# Patient Record
Sex: Female | Born: 1960
Health system: Southern US, Community
[De-identification: ages and names within clinical notes are randomized; demographics above are authoritative.]

## PROBLEM LIST (undated history)

## (undated) DIAGNOSIS — M797 Fibromyalgia: Secondary | ICD-10-CM

## (undated) DIAGNOSIS — E079 Disorder of thyroid, unspecified: Secondary | ICD-10-CM

## (undated) DIAGNOSIS — E7800 Pure hypercholesterolemia, unspecified: Secondary | ICD-10-CM

## (undated) DIAGNOSIS — J449 Chronic obstructive pulmonary disease, unspecified: Secondary | ICD-10-CM

## (undated) DIAGNOSIS — C801 Malignant (primary) neoplasm, unspecified: Secondary | ICD-10-CM

## (undated) DIAGNOSIS — Z9889 Other specified postprocedural states: Secondary | ICD-10-CM

## (undated) DIAGNOSIS — M1990 Unspecified osteoarthritis, unspecified site: Secondary | ICD-10-CM

## (undated) DIAGNOSIS — M4302 Spondylolysis, cervical region: Secondary | ICD-10-CM

## (undated) DIAGNOSIS — E785 Hyperlipidemia, unspecified: Secondary | ICD-10-CM

## (undated) DIAGNOSIS — K635 Polyp of colon: Secondary | ICD-10-CM

## (undated) DIAGNOSIS — I10 Essential (primary) hypertension: Secondary | ICD-10-CM

## (undated) DIAGNOSIS — M51369 Other intervertebral disc degeneration, lumbar region without mention of lumbar back pain or lower extremity pain: Secondary | ICD-10-CM

## (undated) DIAGNOSIS — K219 Gastro-esophageal reflux disease without esophagitis: Secondary | ICD-10-CM

## (undated) DIAGNOSIS — K5790 Diverticulosis of intestine, part unspecified, without perforation or abscess without bleeding: Secondary | ICD-10-CM

## (undated) DIAGNOSIS — L7211 Pilar cyst: Secondary | ICD-10-CM

## (undated) DIAGNOSIS — E119 Type 2 diabetes mellitus without complications: Secondary | ICD-10-CM

## (undated) DIAGNOSIS — Z720 Tobacco use: Secondary | ICD-10-CM

## (undated) DIAGNOSIS — J45909 Unspecified asthma, uncomplicated: Secondary | ICD-10-CM

## (undated) DIAGNOSIS — M654 Radial styloid tenosynovitis [de Quervain]: Secondary | ICD-10-CM

## (undated) DIAGNOSIS — G542 Cervical root disorders, not elsewhere classified: Secondary | ICD-10-CM

## (undated) DIAGNOSIS — F329 Major depressive disorder, single episode, unspecified: Secondary | ICD-10-CM

## (undated) DIAGNOSIS — R32 Unspecified urinary incontinence: Secondary | ICD-10-CM

## (undated) DIAGNOSIS — L680 Hirsutism: Secondary | ICD-10-CM

## (undated) DIAGNOSIS — K760 Fatty (change of) liver, not elsewhere classified: Secondary | ICD-10-CM

## (undated) DIAGNOSIS — K589 Irritable bowel syndrome without diarrhea: Secondary | ICD-10-CM

## (undated) DIAGNOSIS — T1491XA Suicide attempt, initial encounter: Secondary | ICD-10-CM

## (undated) DIAGNOSIS — R768 Other specified abnormal immunological findings in serum: Secondary | ICD-10-CM

## (undated) DIAGNOSIS — G5601 Carpal tunnel syndrome, right upper limb: Secondary | ICD-10-CM

## (undated) DIAGNOSIS — L689 Hypertrichosis, unspecified: Secondary | ICD-10-CM

## (undated) DIAGNOSIS — G8929 Other chronic pain: Secondary | ICD-10-CM

## (undated) DIAGNOSIS — M9981 Other biomechanical lesions of cervical region: Secondary | ICD-10-CM

## (undated) DIAGNOSIS — F419 Anxiety disorder, unspecified: Secondary | ICD-10-CM

## (undated) DIAGNOSIS — E669 Obesity, unspecified: Secondary | ICD-10-CM

## (undated) DIAGNOSIS — M65339 Trigger finger, unspecified middle finger: Secondary | ICD-10-CM

## (undated) DIAGNOSIS — M5136 Other intervertebral disc degeneration, lumbar region: Secondary | ICD-10-CM

## (undated) DIAGNOSIS — G43909 Migraine, unspecified, not intractable, without status migrainosus: Secondary | ICD-10-CM

## (undated) DIAGNOSIS — M549 Dorsalgia, unspecified: Secondary | ICD-10-CM

## (undated) DIAGNOSIS — R609 Edema, unspecified: Secondary | ICD-10-CM

## (undated) DIAGNOSIS — G4700 Insomnia, unspecified: Secondary | ICD-10-CM

## (undated) DIAGNOSIS — K5900 Constipation, unspecified: Secondary | ICD-10-CM

## (undated) DIAGNOSIS — J4489 Other specified chronic obstructive pulmonary disease: Secondary | ICD-10-CM

## (undated) DIAGNOSIS — M4802 Spinal stenosis, cervical region: Secondary | ICD-10-CM

## (undated) DIAGNOSIS — K649 Unspecified hemorrhoids: Secondary | ICD-10-CM

## (undated) DIAGNOSIS — M353 Polymyalgia rheumatica: Secondary | ICD-10-CM

## (undated) HISTORY — DX: Polymyalgia rheumatica: M35.3

## (undated) HISTORY — DX: Fibromyalgia: M79.7

## (undated) HISTORY — DX: Migraine, unspecified, not intractable, without status migrainosus: G43.909

## (undated) HISTORY — DX: Fatty (change of) liver, not elsewhere classified: K76.0

## (undated) HISTORY — DX: Pilar cyst: L72.11

## (undated) HISTORY — DX: Hirsutism: L68.0

## (undated) HISTORY — DX: Disorder of thyroid, unspecified: E07.9

## (undated) HISTORY — DX: Major depressive disorder, single episode, unspecified: F32.9

## (undated) HISTORY — DX: Pure hypercholesterolemia, unspecified: E78.00

## (undated) HISTORY — DX: Edema, unspecified: R60.9

## (undated) HISTORY — DX: Insomnia, unspecified: G47.00

## (undated) HISTORY — DX: Hyperlipidemia, unspecified: E78.5

## (undated) HISTORY — DX: Unspecified urinary incontinence: R32

## (undated) HISTORY — DX: Constipation, unspecified: K59.00

## (undated) HISTORY — DX: Obesity, unspecified: E66.9

## (undated) HISTORY — DX: Other specified postprocedural states: Z98.890

## (undated) HISTORY — DX: Spinal stenosis, cervical region: M48.02

## (undated) HISTORY — DX: Chronic obstructive pulmonary disease, unspecified: J44.9

## (undated) HISTORY — DX: Carpal tunnel syndrome, right upper limb: G56.01

## (undated) HISTORY — DX: Cervical root disorders, not elsewhere classified: G54.2

## (undated) HISTORY — DX: Other specified chronic obstructive pulmonary disease: J44.89

## (undated) HISTORY — DX: Other specified abnormal immunological findings in serum: R76.8

## (undated) HISTORY — DX: Suicide attempt, initial encounter: T14.91XA

## (undated) HISTORY — DX: Unspecified osteoarthritis, unspecified site: M19.90

## (undated) HISTORY — DX: Tobacco use: Z72.0

## (undated) HISTORY — DX: Dorsalgia, unspecified: M54.9

## (undated) HISTORY — DX: Trigger finger, unspecified middle finger: M65.339

## (undated) HISTORY — DX: Unspecified hemorrhoids: K64.9

## (undated) HISTORY — DX: Irritable bowel syndrome without diarrhea: K58.9

## (undated) HISTORY — DX: Spondylolysis, cervical region: M43.02

## (undated) HISTORY — DX: Anxiety disorder, unspecified: F41.9

## (undated) HISTORY — DX: Other intervertebral disc degeneration, lumbar region: M51.36

## (undated) HISTORY — DX: Unspecified asthma, uncomplicated: J45.909

## (undated) HISTORY — DX: Other biomechanical lesions of cervical region: M99.81

## (undated) HISTORY — DX: Gastro-esophageal reflux disease without esophagitis: K21.9

## (undated) HISTORY — DX: Polyp of colon: K63.5

## (undated) HISTORY — DX: Hypertrichosis, unspecified: L68.9

## (undated) HISTORY — DX: Other intervertebral disc degeneration, lumbar region without mention of lumbar back pain or lower extremity pain: M51.369

## (undated) HISTORY — DX: Diverticulosis of intestine, part unspecified, without perforation or abscess without bleeding: K57.90

## (undated) HISTORY — DX: Radial styloid tenosynovitis (de quervain): M65.4
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
## 2003-07-03 DIAGNOSIS — Z720 Tobacco use: Secondary | ICD-10-CM

## 2003-07-03 DIAGNOSIS — K219 Gastro-esophageal reflux disease without esophagitis: Secondary | ICD-10-CM

## 2003-07-03 HISTORY — DX: Gastro-esophageal reflux disease without esophagitis: K21.9

## 2003-07-03 HISTORY — DX: Tobacco use: Z72.0

## 2003-07-05 DIAGNOSIS — E7800 Pure hypercholesterolemia, unspecified: Secondary | ICD-10-CM

## 2003-07-05 HISTORY — DX: Pure hypercholesterolemia, unspecified: E78.00

## 2003-10-18 ENCOUNTER — Ambulatory Visit (HOSPITAL_COMMUNITY): Admission: RE | Admit: 2003-10-18 | Discharge: 2003-10-19 | Payer: Self-pay | Admitting: Neurosurgery

## 2005-09-20 HISTORY — PX: CERVICAL FUSION: SHX112

## 2005-09-20 HISTORY — PX: BACK SURGERY: SHX140

## 2005-10-25 ENCOUNTER — Ambulatory Visit: Payer: Self-pay | Admitting: Cardiology

## 2007-02-26 ENCOUNTER — Ambulatory Visit: Payer: Self-pay | Admitting: Cardiology

## 2008-05-30 DIAGNOSIS — M65339 Trigger finger, unspecified middle finger: Secondary | ICD-10-CM

## 2008-05-30 HISTORY — DX: Trigger finger, unspecified middle finger: M65.339

## 2008-09-20 DIAGNOSIS — K589 Irritable bowel syndrome without diarrhea: Secondary | ICD-10-CM

## 2008-09-20 DIAGNOSIS — G4700 Insomnia, unspecified: Secondary | ICD-10-CM

## 2008-09-20 HISTORY — DX: Irritable bowel syndrome, unspecified: K58.9

## 2008-09-20 HISTORY — DX: Insomnia, unspecified: G47.00

## 2008-10-11 DIAGNOSIS — G5601 Carpal tunnel syndrome, right upper limb: Secondary | ICD-10-CM

## 2008-10-11 HISTORY — DX: Carpal tunnel syndrome, right upper limb: G56.01

## 2009-03-31 DIAGNOSIS — M353 Polymyalgia rheumatica: Secondary | ICD-10-CM

## 2009-03-31 HISTORY — DX: Polymyalgia rheumatica: M35.3

## 2009-06-30 DIAGNOSIS — M797 Fibromyalgia: Secondary | ICD-10-CM

## 2009-06-30 HISTORY — DX: Fibromyalgia: M79.7

## 2011-06-25 DIAGNOSIS — J449 Chronic obstructive pulmonary disease, unspecified: Secondary | ICD-10-CM

## 2011-06-25 DIAGNOSIS — T1491XA Suicide attempt, initial encounter: Secondary | ICD-10-CM

## 2011-06-25 DIAGNOSIS — F32A Depression, unspecified: Secondary | ICD-10-CM

## 2011-06-25 DIAGNOSIS — M1990 Unspecified osteoarthritis, unspecified site: Secondary | ICD-10-CM

## 2011-06-25 HISTORY — DX: Suicide attempt, initial encounter: T14.91XA

## 2011-06-25 HISTORY — DX: Depression, unspecified: F32.A

## 2011-06-25 HISTORY — DX: Unspecified osteoarthritis, unspecified site: M19.90

## 2011-06-25 HISTORY — DX: Chronic obstructive pulmonary disease, unspecified: J44.9

## 2011-07-30 DIAGNOSIS — L7211 Pilar cyst: Secondary | ICD-10-CM

## 2011-07-30 HISTORY — DX: Pilar cyst: L72.11

## 2011-09-16 DIAGNOSIS — R079 Chest pain, unspecified: Secondary | ICD-10-CM

## 2011-10-13 DIAGNOSIS — G43909 Migraine, unspecified, not intractable, without status migrainosus: Secondary | ICD-10-CM

## 2011-10-13 HISTORY — DX: Migraine, unspecified, not intractable, without status migrainosus: G43.909

## 2012-01-05 HISTORY — PX: WRIST ARTHROSCOPY: SHX838

## 2012-01-05 HISTORY — PX: CARPECTOMY WITH RADIAL STYLOIDECTOMY: SHX5609

## 2012-08-22 DIAGNOSIS — R768 Other specified abnormal immunological findings in serum: Secondary | ICD-10-CM

## 2012-08-22 HISTORY — DX: Other specified abnormal immunological findings in serum: R76.8

## 2012-08-25 DIAGNOSIS — K635 Polyp of colon: Secondary | ICD-10-CM

## 2012-08-25 DIAGNOSIS — K5790 Diverticulosis of intestine, part unspecified, without perforation or abscess without bleeding: Secondary | ICD-10-CM

## 2012-08-25 HISTORY — DX: Polyp of colon: K63.5

## 2012-08-25 HISTORY — DX: Diverticulosis of intestine, part unspecified, without perforation or abscess without bleeding: K57.90

## 2012-10-03 DIAGNOSIS — G542 Cervical root disorders, not elsewhere classified: Secondary | ICD-10-CM

## 2012-10-03 DIAGNOSIS — M4302 Spondylolysis, cervical region: Secondary | ICD-10-CM

## 2012-10-03 HISTORY — DX: Cervical root disorders, not elsewhere classified: G54.2

## 2012-10-03 HISTORY — DX: Spondylolysis, cervical region: M43.02

## 2013-04-04 HISTORY — PX: ABDOMINAL HYSTERECTOMY: SHX81

## 2013-07-02 DIAGNOSIS — M654 Radial styloid tenosynovitis [de Quervain]: Secondary | ICD-10-CM

## 2013-07-02 HISTORY — DX: Radial styloid tenosynovitis (de quervain): M65.4

## 2013-08-23 DIAGNOSIS — E669 Obesity, unspecified: Secondary | ICD-10-CM

## 2013-08-23 HISTORY — DX: Obesity, unspecified: E66.9

## 2014-07-01 DIAGNOSIS — K649 Unspecified hemorrhoids: Secondary | ICD-10-CM

## 2014-07-01 HISTORY — DX: Unspecified hemorrhoids: K64.9

## 2015-06-10 DIAGNOSIS — K760 Fatty (change of) liver, not elsewhere classified: Secondary | ICD-10-CM

## 2015-06-10 HISTORY — DX: Fatty (change of) liver, not elsewhere classified: K76.0

## 2015-10-01 LAB — HEMOGLOBIN A1C: Hemoglobin A1C: 5.8

## 2015-10-01 LAB — BASIC METABOLIC PANEL
BUN: 11 mg/dL (ref 4–21)
Creatinine: 0.8 mg/dL (ref 0.5–1.1)
Glucose: 80 mg/dL
Potassium: 4.2 mmol/L (ref 3.4–5.3)
SODIUM: 141 mmol/L (ref 137–147)

## 2015-10-01 LAB — LIPID PANEL
Cholesterol: 243 mg/dL — AB (ref 0–200)
HDL: 40 mg/dL (ref 35–70)
LDL Cholesterol: 164 mg/dL
LDl/HDL Ratio: 4.1
TRIGLYCERIDES: 192 mg/dL — AB (ref 40–160)

## 2015-10-01 LAB — HEPATIC FUNCTION PANEL
ALT: 16 U/L (ref 7–35)
AST: 17 U/L (ref 13–35)
Alkaline Phosphatase: 139 U/L — AB (ref 25–125)

## 2015-10-01 LAB — TSH: TSH: 1.06 u[IU]/mL (ref 0.41–5.90)

## 2015-10-27 ENCOUNTER — Encounter: Payer: Self-pay | Admitting: Internal Medicine

## 2015-10-27 ENCOUNTER — Ambulatory Visit: Payer: Medicare Other | Admitting: Internal Medicine

## 2017-10-31 DIAGNOSIS — E785 Hyperlipidemia, unspecified: Secondary | ICD-10-CM | POA: Diagnosis not present

## 2017-10-31 DIAGNOSIS — R7301 Impaired fasting glucose: Secondary | ICD-10-CM | POA: Diagnosis not present

## 2017-10-31 DIAGNOSIS — M5010 Cervical disc disorder with radiculopathy, unspecified cervical region: Secondary | ICD-10-CM | POA: Diagnosis not present

## 2017-10-31 DIAGNOSIS — I10 Essential (primary) hypertension: Secondary | ICD-10-CM | POA: Diagnosis not present

## 2017-11-02 ENCOUNTER — Encounter: Payer: Self-pay | Admitting: Internal Medicine

## 2017-11-04 ENCOUNTER — Other Ambulatory Visit (HOSPITAL_COMMUNITY): Payer: Self-pay | Admitting: Internal Medicine

## 2017-11-04 DIAGNOSIS — Z780 Asymptomatic menopausal state: Secondary | ICD-10-CM

## 2017-11-04 DIAGNOSIS — Z1231 Encounter for screening mammogram for malignant neoplasm of breast: Secondary | ICD-10-CM

## 2017-11-11 ENCOUNTER — Ambulatory Visit (HOSPITAL_COMMUNITY)
Admission: RE | Admit: 2017-11-11 | Discharge: 2017-11-11 | Disposition: A | Discharge status: Home / Self Care | Payer: Medicare Other | Source: Ambulatory Visit | Attending: Internal Medicine | Admitting: Internal Medicine

## 2017-11-11 ENCOUNTER — Encounter (HOSPITAL_COMMUNITY): Payer: Self-pay

## 2017-11-11 DIAGNOSIS — Z780 Asymptomatic menopausal state: Secondary | ICD-10-CM | POA: Insufficient documentation

## 2017-11-11 DIAGNOSIS — Z1231 Encounter for screening mammogram for malignant neoplasm of breast: Secondary | ICD-10-CM | POA: Insufficient documentation

## 2017-11-11 DIAGNOSIS — M85852 Other specified disorders of bone density and structure, left thigh: Secondary | ICD-10-CM | POA: Diagnosis not present

## 2017-11-15 DIAGNOSIS — E782 Mixed hyperlipidemia: Secondary | ICD-10-CM | POA: Diagnosis not present

## 2017-11-15 DIAGNOSIS — I10 Essential (primary) hypertension: Secondary | ICD-10-CM | POA: Diagnosis not present

## 2017-11-15 DIAGNOSIS — R7301 Impaired fasting glucose: Secondary | ICD-10-CM | POA: Diagnosis not present

## 2017-11-15 DIAGNOSIS — Z0000 Encounter for general adult medical examination without abnormal findings: Secondary | ICD-10-CM | POA: Diagnosis not present

## 2017-12-13 DIAGNOSIS — F5101 Primary insomnia: Secondary | ICD-10-CM | POA: Diagnosis not present

## 2017-12-13 DIAGNOSIS — M5136 Other intervertebral disc degeneration, lumbar region: Secondary | ICD-10-CM | POA: Diagnosis not present

## 2017-12-21 ENCOUNTER — Ambulatory Visit: Payer: Medicare Other | Admitting: Gastroenterology

## 2018-01-11 DIAGNOSIS — M542 Cervicalgia: Secondary | ICD-10-CM | POA: Diagnosis not present

## 2018-01-11 DIAGNOSIS — I10 Essential (primary) hypertension: Secondary | ICD-10-CM | POA: Diagnosis not present

## 2018-01-11 DIAGNOSIS — G8929 Other chronic pain: Secondary | ICD-10-CM | POA: Diagnosis not present

## 2018-01-11 DIAGNOSIS — M1990 Unspecified osteoarthritis, unspecified site: Secondary | ICD-10-CM | POA: Diagnosis not present

## 2018-01-11 DIAGNOSIS — M797 Fibromyalgia: Secondary | ICD-10-CM | POA: Diagnosis not present

## 2018-01-11 DIAGNOSIS — J449 Chronic obstructive pulmonary disease, unspecified: Secondary | ICD-10-CM | POA: Diagnosis not present

## 2018-01-11 DIAGNOSIS — M545 Low back pain: Secondary | ICD-10-CM | POA: Diagnosis not present

## 2018-01-11 DIAGNOSIS — Z79899 Other long term (current) drug therapy: Secondary | ICD-10-CM | POA: Diagnosis not present

## 2018-01-11 DIAGNOSIS — M25512 Pain in left shoulder: Secondary | ICD-10-CM | POA: Diagnosis not present

## 2018-01-11 DIAGNOSIS — E7800 Pure hypercholesterolemia, unspecified: Secondary | ICD-10-CM | POA: Diagnosis not present

## 2018-01-11 DIAGNOSIS — K219 Gastro-esophageal reflux disease without esophagitis: Secondary | ICD-10-CM | POA: Diagnosis not present

## 2018-02-27 ENCOUNTER — Other Ambulatory Visit: Payer: Self-pay

## 2018-02-27 NOTE — Patient Outreach (Signed)
[facility]) Care Management  02/27/2018  GRACELYNN BIRCHER Jan 09, 1961 [id number]   Medication Adherence call to Mrs. Kahlea Cobert left a message for patient to call back patient is due on Lovastatin 20 mg. Under [facility] Ins.   [place] Management Direct Dial [phone]  Fax [phone] [email or web address]

## 2018-03-09 ENCOUNTER — Ambulatory Visit: Payer: Medicare Other | Admitting: Gastroenterology

## 2018-03-14 DIAGNOSIS — R42 Dizziness and giddiness: Secondary | ICD-10-CM | POA: Diagnosis not present

## 2018-03-14 DIAGNOSIS — I10 Essential (primary) hypertension: Secondary | ICD-10-CM | POA: Diagnosis not present

## 2018-03-14 DIAGNOSIS — N3281 Overactive bladder: Secondary | ICD-10-CM | POA: Diagnosis not present

## 2018-03-14 DIAGNOSIS — R002 Palpitations: Secondary | ICD-10-CM | POA: Diagnosis not present

## 2018-03-14 DIAGNOSIS — R51 Headache: Secondary | ICD-10-CM | POA: Diagnosis not present

## 2018-06-05 ENCOUNTER — Encounter: Payer: Self-pay | Admitting: Gastroenterology

## 2018-06-05 ENCOUNTER — Telehealth: Payer: Self-pay | Admitting: Gastroenterology

## 2018-06-05 ENCOUNTER — Ambulatory Visit: Payer: Medicare Other | Admitting: Gastroenterology

## 2018-06-05 NOTE — Telephone Encounter (Signed)
PATIENT WAS A NO SHOW AND LETTER SENT  °

## 2018-08-25 DIAGNOSIS — Z79891 Long term (current) use of opiate analgesic: Secondary | ICD-10-CM | POA: Diagnosis not present

## 2018-08-25 DIAGNOSIS — G894 Chronic pain syndrome: Secondary | ICD-10-CM | POA: Diagnosis not present

## 2018-08-25 DIAGNOSIS — I10 Essential (primary) hypertension: Secondary | ICD-10-CM | POA: Diagnosis not present

## 2018-09-27 DIAGNOSIS — Z79891 Long term (current) use of opiate analgesic: Secondary | ICD-10-CM | POA: Diagnosis not present

## 2018-09-27 DIAGNOSIS — E559 Vitamin D deficiency, unspecified: Secondary | ICD-10-CM | POA: Diagnosis not present

## 2018-09-27 DIAGNOSIS — I10 Essential (primary) hypertension: Secondary | ICD-10-CM | POA: Diagnosis not present

## 2018-09-27 DIAGNOSIS — G894 Chronic pain syndrome: Secondary | ICD-10-CM | POA: Diagnosis not present

## 2018-09-27 DIAGNOSIS — M8580 Other specified disorders of bone density and structure, unspecified site: Secondary | ICD-10-CM | POA: Diagnosis not present

## 2018-09-27 DIAGNOSIS — Z1329 Encounter for screening for other suspected endocrine disorder: Secondary | ICD-10-CM | POA: Diagnosis not present

## 2018-09-27 DIAGNOSIS — Z131 Encounter for screening for diabetes mellitus: Secondary | ICD-10-CM | POA: Diagnosis not present

## 2018-09-29 DIAGNOSIS — E782 Mixed hyperlipidemia: Secondary | ICD-10-CM | POA: Diagnosis not present

## 2018-09-29 DIAGNOSIS — E559 Vitamin D deficiency, unspecified: Secondary | ICD-10-CM | POA: Diagnosis not present

## 2018-09-29 DIAGNOSIS — I10 Essential (primary) hypertension: Secondary | ICD-10-CM | POA: Diagnosis not present

## 2018-09-29 DIAGNOSIS — T8189XD Other complications of procedures, not elsewhere classified, subsequent encounter: Secondary | ICD-10-CM | POA: Diagnosis not present

## 2018-10-17 IMAGING — MG DIGITAL SCREENING BILATERAL MAMMOGRAM WITH CAD
5 series · 5 of 5 positions shown · non-contrast
Comparison: None.

ACR Breast Density Category a: The breast tissue is almost entirely
fatty.

CLINICAL DATA: Screening.

EXAM:
DIGITAL SCREENING BILATERAL MAMMOGRAM WITH CAD

[L CC]
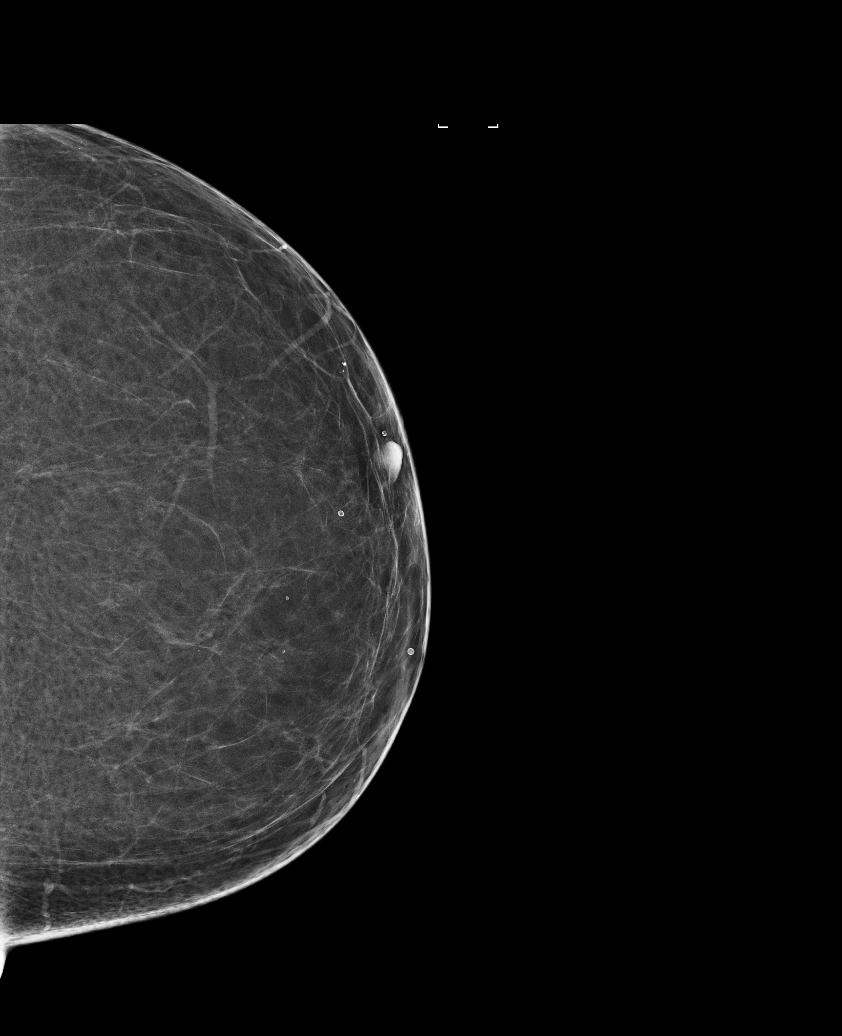

[R MLO (1 of 2)]
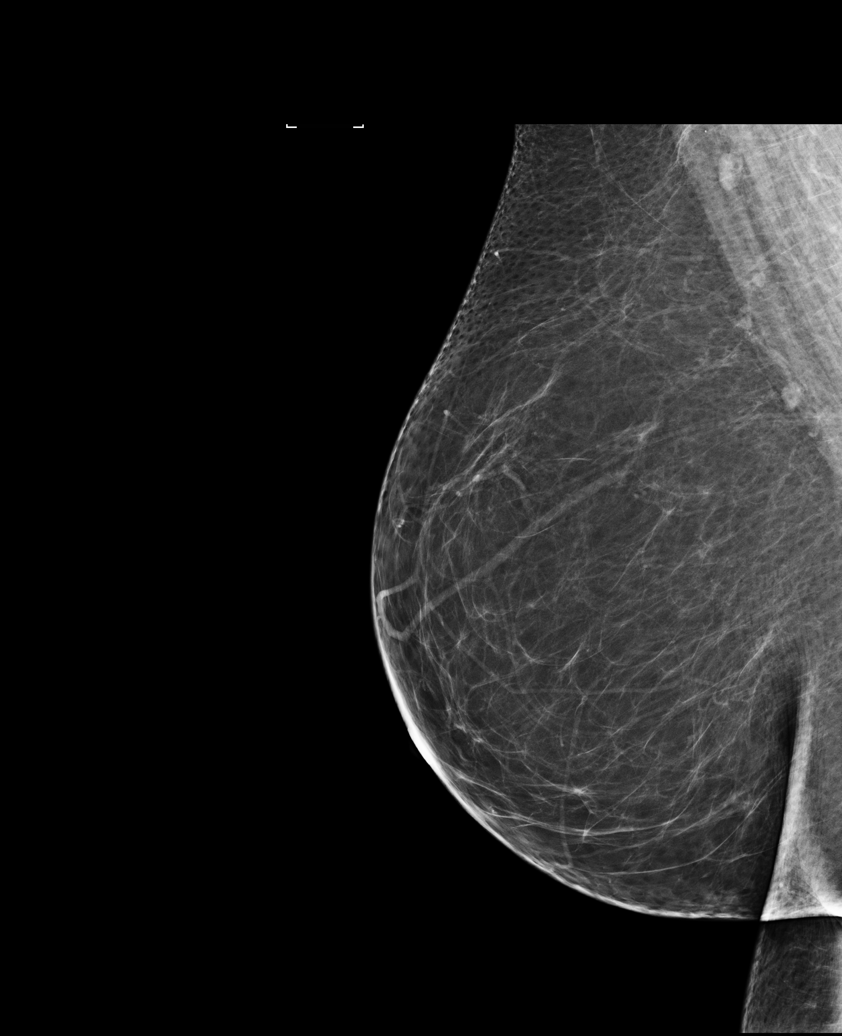

[L MLO]
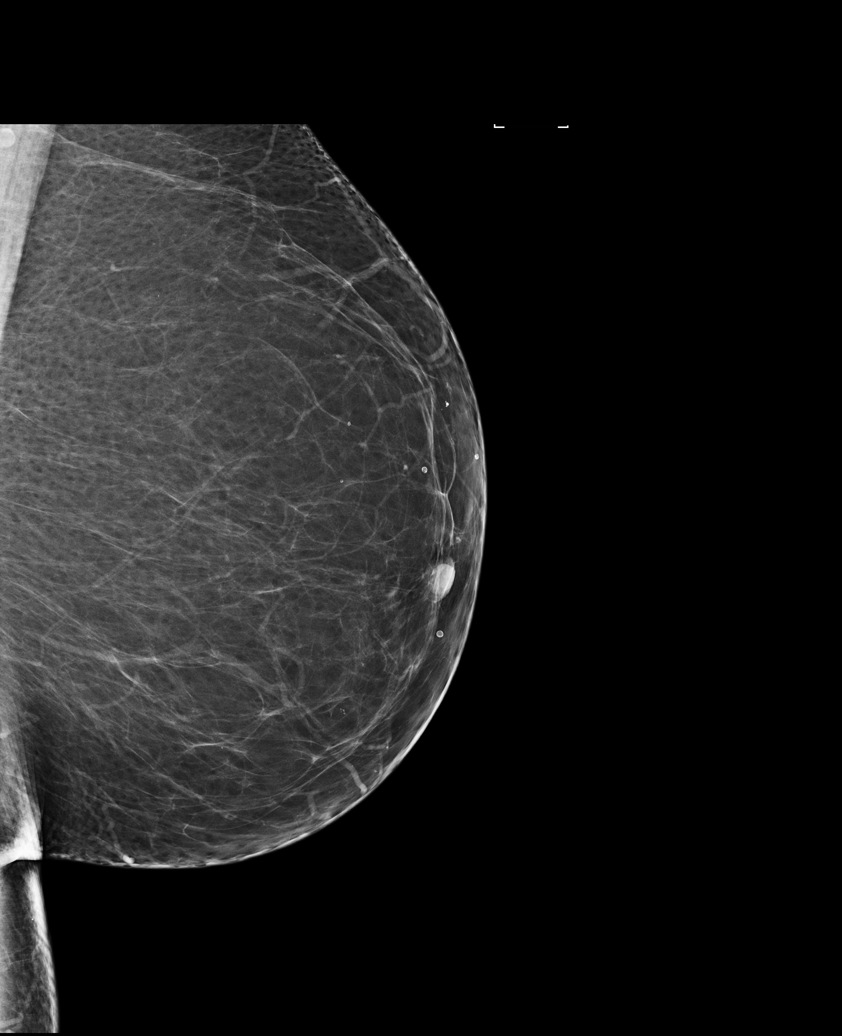

[R MLO (2 of 2)]
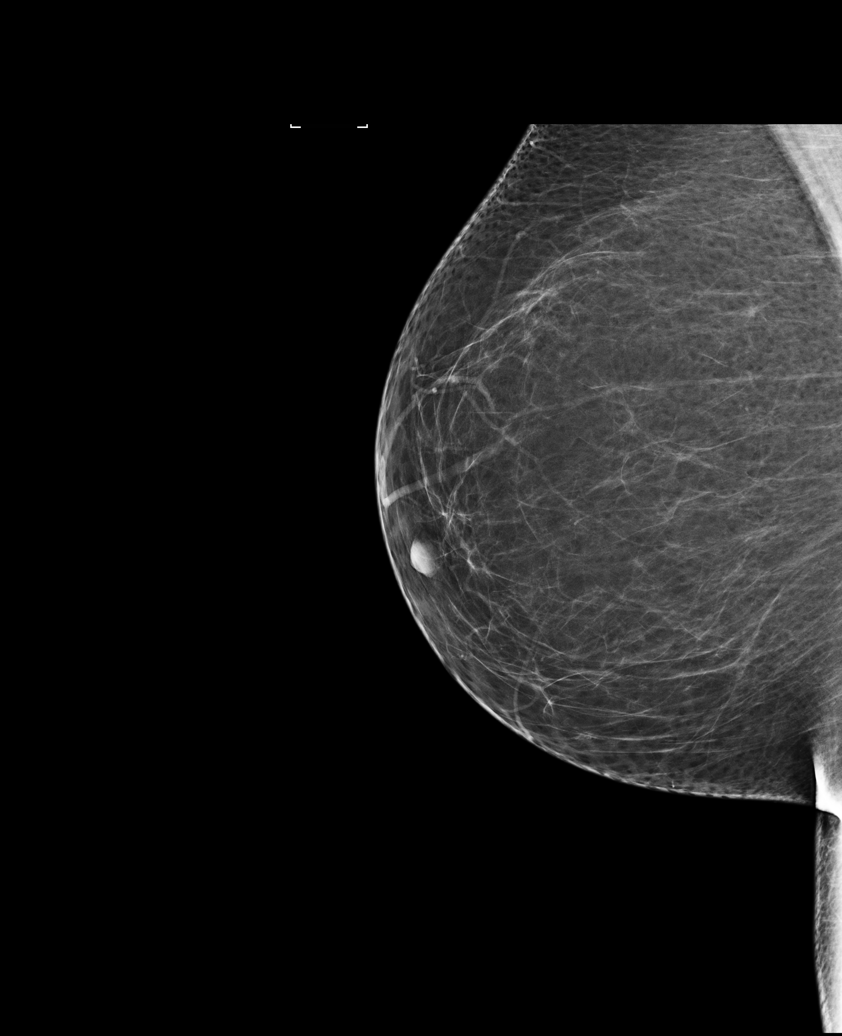

[R CC]
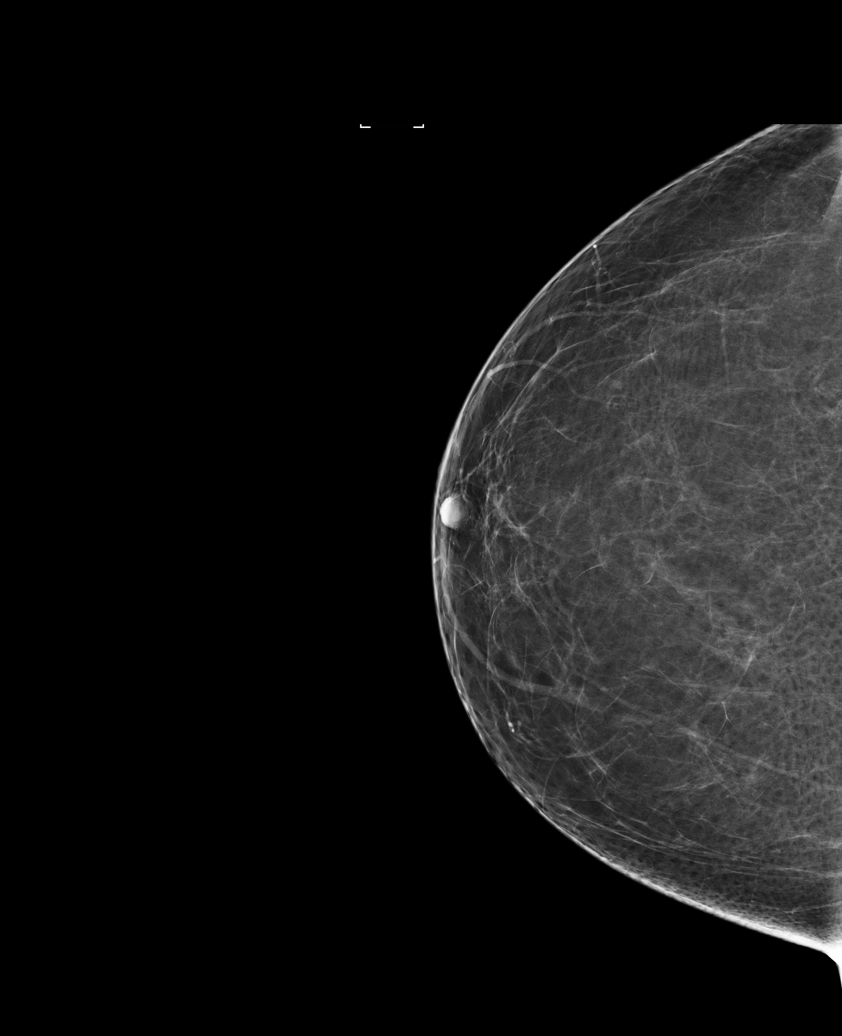

[5 of 5 positions shown; findings below may reference images not displayed]

FINDINGS: There are no findings suspicious for malignancy. Images were
processed with CAD.
IMPRESSION: No mammographic evidence of malignancy. A result letter of this
screening mammogram will be mailed directly to the patient.

RECOMMENDATION:
Screening mammogram in one year. (Code:[id number])

BI-RADS CATEGORY  1: Negative.

## 2018-12-20 DIAGNOSIS — E782 Mixed hyperlipidemia: Secondary | ICD-10-CM | POA: Diagnosis not present

## 2018-12-20 DIAGNOSIS — I10 Essential (primary) hypertension: Secondary | ICD-10-CM | POA: Diagnosis not present

## 2018-12-20 DIAGNOSIS — R7301 Impaired fasting glucose: Secondary | ICD-10-CM | POA: Diagnosis not present

## 2018-12-21 DIAGNOSIS — L2089 Other atopic dermatitis: Secondary | ICD-10-CM | POA: Diagnosis not present

## 2019-01-05 DIAGNOSIS — R109 Unspecified abdominal pain: Secondary | ICD-10-CM | POA: Diagnosis not present

## 2019-01-05 DIAGNOSIS — R300 Dysuria: Secondary | ICD-10-CM | POA: Diagnosis not present

## 2019-01-05 DIAGNOSIS — R3915 Urgency of urination: Secondary | ICD-10-CM | POA: Diagnosis not present

## 2019-01-15 DIAGNOSIS — I10 Essential (primary) hypertension: Secondary | ICD-10-CM | POA: Diagnosis not present

## 2019-01-17 ENCOUNTER — Other Ambulatory Visit: Payer: Self-pay | Admitting: Internal Medicine

## 2019-01-17 DIAGNOSIS — E785 Hyperlipidemia, unspecified: Secondary | ICD-10-CM | POA: Diagnosis not present

## 2019-01-17 DIAGNOSIS — I10 Essential (primary) hypertension: Secondary | ICD-10-CM | POA: Diagnosis not present

## 2019-01-17 DIAGNOSIS — Z780 Asymptomatic menopausal state: Secondary | ICD-10-CM

## 2019-01-17 DIAGNOSIS — Z0000 Encounter for general adult medical examination without abnormal findings: Secondary | ICD-10-CM | POA: Diagnosis not present

## 2019-01-17 DIAGNOSIS — R600 Localized edema: Secondary | ICD-10-CM | POA: Diagnosis not present

## 2019-01-17 DIAGNOSIS — E2839 Other primary ovarian failure: Secondary | ICD-10-CM

## 2019-01-17 DIAGNOSIS — N3281 Overactive bladder: Secondary | ICD-10-CM | POA: Diagnosis not present

## 2019-01-18 ENCOUNTER — Encounter (INDEPENDENT_AMBULATORY_CARE_PROVIDER_SITE_OTHER): Payer: Self-pay | Admitting: *Deleted

## 2019-02-01 DIAGNOSIS — I10 Essential (primary) hypertension: Secondary | ICD-10-CM | POA: Diagnosis not present

## 2019-02-01 DIAGNOSIS — R7301 Impaired fasting glucose: Secondary | ICD-10-CM | POA: Diagnosis not present

## 2019-02-01 DIAGNOSIS — E782 Mixed hyperlipidemia: Secondary | ICD-10-CM | POA: Diagnosis not present

## 2019-03-23 DIAGNOSIS — E782 Mixed hyperlipidemia: Secondary | ICD-10-CM | POA: Diagnosis not present

## 2019-03-23 DIAGNOSIS — I10 Essential (primary) hypertension: Secondary | ICD-10-CM | POA: Diagnosis not present

## 2019-03-23 DIAGNOSIS — R7301 Impaired fasting glucose: Secondary | ICD-10-CM | POA: Diagnosis not present

## 2019-04-02 DIAGNOSIS — R1030 Lower abdominal pain, unspecified: Secondary | ICD-10-CM | POA: Diagnosis not present

## 2019-04-02 DIAGNOSIS — L989 Disorder of the skin and subcutaneous tissue, unspecified: Secondary | ICD-10-CM | POA: Diagnosis not present

## 2019-04-02 DIAGNOSIS — L680 Hirsutism: Secondary | ICD-10-CM | POA: Diagnosis not present

## 2019-04-10 ENCOUNTER — Telehealth: Payer: Self-pay | Admitting: Internal Medicine

## 2019-04-10 NOTE — Telephone Encounter (Signed)
Pt is aware of OV tomorrow °

## 2019-04-10 NOTE — Telephone Encounter (Signed)
[phone]  PATIENT CALLED TO MAKE AN APPOINTMENT, PLEASE CALL BACK

## 2019-04-11 ENCOUNTER — Ambulatory Visit (INDEPENDENT_AMBULATORY_CARE_PROVIDER_SITE_OTHER): Payer: Medicare Other | Admitting: Nurse Practitioner

## 2019-04-11 ENCOUNTER — Other Ambulatory Visit: Payer: Self-pay

## 2019-04-11 ENCOUNTER — Encounter: Payer: Self-pay | Admitting: Nurse Practitioner

## 2019-04-11 DIAGNOSIS — R195 Other fecal abnormalities: Secondary | ICD-10-CM | POA: Diagnosis not present

## 2019-04-11 DIAGNOSIS — K219 Gastro-esophageal reflux disease without esophagitis: Secondary | ICD-10-CM

## 2019-04-11 DIAGNOSIS — L858 Other specified epidermal thickening: Secondary | ICD-10-CM | POA: Diagnosis not present

## 2019-04-11 DIAGNOSIS — C44629 Squamous cell carcinoma of skin of left upper limb, including shoulder: Secondary | ICD-10-CM | POA: Diagnosis not present

## 2019-04-11 HISTORY — DX: Other fecal abnormalities: R19.5

## 2019-04-11 MED ORDER — CLENPIQ 10-3.5-12 MG-GM -GM/160ML PO SOLN: 1.0000 | Freq: Once | ORAL | 0 refills | Status: AC

## 2019-04-11 MED ORDER — OMEPRAZOLE 20 MG PO CPDR: 20.0000 mg | DELAYED_RELEASE_CAPSULE | Freq: Two times a day (BID) | ORAL | 3 refills | Status: DC

## 2019-04-11 NOTE — Assessment & Plan Note (Signed)
Patient historically has GERD which is been well managed on omeprazole 20 mg daily.  As of the past 2 to 3 months her symptoms have gotten progressively worse.  Symptoms tend to be worsening later in the evening.  She may not be getting adequate round-the-clock coverage with her current dosage.  I will increase her omeprazole 20 mg twice a day.  Follow-up in 3 months to evaluate her progress with increased dosage.

## 2019-04-11 NOTE — Assessment & Plan Note (Signed)
Referred by primary care for complaints of red stools which were found to be heme positive on Hemoccult card.  She is previously had a colonoscopy in [facility] and we will request this report for her records.  She states this is about 9 years ago when she was 58 years old and she would normally be coming due for a repeat 10-year colonoscopy.  Given her heme positive stools we will proceed with colonoscopy at this time.  Further recommendations to follow.  Proceed with TCS on propofol/MAC with Dr. Gala Romney in near future: the risks, benefits, and alternatives have been discussed with the patient in detail. The patient states understanding and desires to proceed.  The patient is currently on Lexapro, hydrocodone.  No other anticoagulants, anxiolytics, chronic pain medications, or antidepressants.  The patient is not on any diabetes or iron medications.  Denies alcohol and drug use.  We will plan for the procedure on propofol/MAC to promote adequate sedation.

## 2019-04-11 NOTE — Patient Instructions (Addendum)
Your health issues we discussed today were:   Blood in your stools: 1. We will check a colonoscopy to try to find the source of the bleeding 2. Further recommendations will follow your procedure 3. Call us if you have any worsening/severe bleeding, dizziness, lightheadedness, weakness, fatigue, or other symptoms that you are losing a lot of blood.  GERD (reflux/heartburn): 1. Due to your worsening reflux/heartburn symptoms I will increase your omeprazole to 20 mg twice a day. 2. Take this 30 minutes before your first meal the day and 30 minutes for your last meal the day 3. Avoid trigger foods 4. Avoid eating within 3 to 4 hours of lying down for sleep 5. Further diet recommendations below.  Overall I recommend:  1. Continue your other current medications 2. Return for follow-up in 3 months 3. Call us if you have any questions or concerns.   Because of recent events of COVID-19 ("Coronavirus"), follow CDC recommendations:  1. Wash your hand frequently 2. Avoid touching your face 3. Stay away from people who are sick 4. If you have symptoms such as fever, cough, shortness of breath then call your healthcare provider for further guidance 5. If you are sick, STAY AT HOME unless otherwise directed by your healthcare provider. 6. Follow directions from state and national officials regarding staying safe   At [facility] we value your feedback. You may receive a survey about your visit today. Please share your experience as we strive to create trusting relationships with our patients to provide genuine, compassionate, quality care.  We appreciate your understanding and patience as we review any laboratory studies, imaging, and other diagnostic tests that are ordered as we care for you. Our office policy is 5 business days for review of these results, and any emergent or urgent results are addressed in a timely manner for your best interest. If you do not hear from our office  in 1 week, please contact us.   We also encourage the use of MyChart, which contains your medical information for your review as well. If you are not enrolled in this feature, an access code is on this after visit summary for your convenience. Thank you for allowing [place] to be involved in your care.  It was great to see you today!  I hope you have a great summer!!       Food Choices for Gastroesophageal Reflux Disease, Adult When you have gastroesophageal reflux disease (GERD), the foods you eat and your eating habits are very important. Choosing the right foods can help ease the discomfort of GERD. Consider working with a diet and nutrition specialist (dietitian) to help you make healthy food choices. What general guidelines should I follow?  Eating plan  Choose healthy foods low in fat, such as fruits, vegetables, whole grains, low-fat dairy products, and lean meat, fish, and poultry.  Eat frequent, small meals instead of three large meals each day. Eat your meals slowly, in a relaxed setting. Avoid bending over or lying down until 2-3 hours after eating.  Limit high-fat foods such as fatty meats or fried foods.  Limit your intake of oils, butter, and shortening to less than 8 teaspoons each day.  Avoid the following: ? Foods that cause symptoms. These may be different for different people. Keep a food diary to keep track of foods that cause symptoms. ? Alcohol. ? Drinking large amounts of liquid with meals. ? Eating meals during the 2-3 hours before bed.  Cook foods using methods other  than frying. This may include baking, grilling, or broiling. Lifestyle  Maintain a healthy weight. Ask your health care provider what weight is healthy for you. If you need to lose weight, work with your health care provider to do so safely.  Exercise for at least 30 minutes on 5 or more days each week, or as told by your health care provider.  Avoid wearing clothes that fit tightly around your  waist and chest.  Do not use any products that contain nicotine or tobacco, such as cigarettes and e-cigarettes. If you need help quitting, ask your health care provider.  Sleep with the head of your bed raised. Use a wedge under the mattress or blocks under the bed frame to raise the head of the bed. What foods are not recommended? The items listed may not be a complete list. Talk with your dietitian about what dietary choices are best for you. Grains Pastries or quick breads with added fat. [place] toast. Vegetables Deep fried vegetables. [place] fries. Any vegetables prepared with added fat. Any vegetables that cause symptoms. For some people this may include tomatoes and tomato products, chili peppers, onions and garlic, and horseradish. Fruits Any fruits prepared with added fat. Any fruits that cause symptoms. For some people this may include citrus fruits, such as oranges, grapefruit, pineapple, and lemons. Meats and other protein foods High-fat meats, such as fatty beef or pork, hot dogs, ribs, ham, sausage, salami and bacon. Fried meat or protein, including fried fish and fried chicken. Nuts and nut butters. Dairy Whole milk and chocolate milk. Sour cream. Cream. Ice cream. Cream cheese. Milk shakes. Beverages Coffee and tea, with or without caffeine. Carbonated beverages. Sodas. Energy drinks. Fruit juice made with acidic fruits (such as orange or grapefruit). Tomato juice. Alcoholic drinks. Fats and oils Butter. Margarine. Shortening. Ghee. Sweets and desserts Chocolate and cocoa. Donuts. Seasoning and other foods Pepper. Peppermint and spearmint. Any condiments, herbs, or seasonings that cause symptoms. For some people, this may include curry, hot sauce, or vinegar-based salad dressings. Summary  When you have gastroesophageal reflux disease (GERD), food and lifestyle choices are very important to help ease the discomfort of GERD.  Eat frequent, small meals instead of three  large meals each day. Eat your meals slowly, in a relaxed setting. Avoid bending over or lying down until 2-3 hours after eating.  Limit high-fat foods such as fatty meat or fried foods. This information is not intended to replace advice given to you by your health care provider. Make sure you discuss any questions you have with your health care provider. Document Released: 09/06/2005 Document Revised: 12/28/2018 Document Reviewed: 09/07/2016 Elsevier Patient Education  2020 [employer].

## 2019-04-11 NOTE — Progress Notes (Signed)
Primary Care Physician:  Celene Squibb, MD Primary Gastroenterologist:  Dr. Gala Romney  Chief Complaint  Patient presents with  . heme+    last TCS was done in [place]  . Gastroesophageal Reflux    HPI:   Stacey Brooks is a 58 y.o. female who presents on referral from primary care for blood in the stool.  Reviewed information provided with referral including office visit dated 05/12/60.  At that time noted left lower abdominal pain and diarrhea postprandially for 2 months.  At that time she noted some red bowel movements and Hemoccult cards recommended.  She was also empirically treated for diverticulitis with Cipro and Flagyl.  Hemoccult cards were positive for blood and she was subsequently referred to GI.Marland Kitchen  No history of colonoscopy or endoscopy in our system.  Today she states she's doing ok overall. States her last colonoscopy was in [place], [place] at age 49 (about 9 years ago). She noted some blood in her stools and they checked a test and it was positive for blood. She has had a couple instances of fecal urgency without diarrhea. GERD has gotten worse in the past 2-3 months; symptoms include epigastric pain. Denies throat burning/bitter taste. When she was initially diagnosed her symptoms were the same and improved with PPI; now getting worse. Is avoiding spicy foods. Denies dysphagia. Saw hematochezia 2 times, in the commode. Denies known hemorrhoids. Yesterday she did have rectal soreness and itching. Denies other abdominal pain, N/V, melena, fever, chills, unintentional weight loss. Denies URI or flu-like symptoms. Denies loss of sense of taste or smell. Denies chest pain, dyspnea, dizziness, lightheadedness, syncope, near syncope. Denies any other upper or lower GI symptoms.  Past Medical History:  Diagnosis Date  . ANA positive   . Anxiety   . Asthma    Intermittent; asthmatic bronchitis  . Back pain 06/25/2011  . Carpal tunnel syndrome on right 10/11/2008  . Cervical  nerve root impingement 10/03/2012  . Cervical spondylolysis 10/03/2012  . Colon polyps 08/25/2012  . Constipation   . COPD (chronic obstructive pulmonary disease) ([place]) 06/25/2011  . De Quervain's disease (tenosynovitis) 07/02/2013  . Depression 06/25/2011  . Diverticulosis 08/25/2012  . Edema 07/03/2003  . Excessive hair 08/22/2012   body and facial   . Fatty liver 06/10/2015  . Fibromyalgia 06/30/2009  . GERD (gastroesophageal reflux disease) 07/03/2003  . H/O bladder repair surgery 02/23/2013  . H/O neck surgery 10/03/2012  . Hemorrhoids 07/01/2014  . Hirsutism   . Hypercholesterolemia 07/05/2003   Mild  . Hyperlipidemia   . IBS (irritable bowel syndrome) 2010  . Incontinence of urine in female 09/09/2003  . Insomnia 2010  . Lumbar degenerative disc disease   . Migraine 10/13/2011  . Neural foraminal stenosis of cervical spine   . Obesity 08/23/2013  . Obstructive chronic bronchitis ([place])   . Osteoarthritis 06/25/2011  . Pilar cyst 07/30/2011  . Polymyalgia ([place]) 03/31/2009  . SS-A antibody positive 08/22/2012  . Suicide attempt ([place]) 06/25/2011  . Thyroid disease    Hypothyroidism  . Tobacco abuse 07/03/2003  . Trigger middle finger 05/30/2008    Past Surgical History:  Procedure Laterality Date  . ABDOMINAL HYSTERECTOMY  04/04/2013  . BACK SURGERY  2007  . CARPECTOMY WITH RADIAL STYLOIDECTOMY  01/05/2012   DeQuervain's Release   . CERVICAL FUSION  2007  . WRIST ARTHROSCOPY  01/05/2012   release of carpal ligament    Current Outpatient Medications  Medication Sig Dispense Refill  . albuterol (  PROVENTIL HFA;VENTOLIN HFA) 108 (90 Base) MCG/ACT inhaler Inhale 2 puffs into the lungs every 6 (six) hours as needed for wheezing or shortness of breath.    Marland Kitchen amLODipine (NORVASC) 5 MG tablet Take 1 tablet by mouth daily.    Marland Kitchen escitalopram (LEXAPRO) 5 MG tablet Take 5 mg by mouth daily.    . hydrochlorothiazide (HYDRODIURIL) 25 MG tablet Take 25 mg by mouth daily.    Marland Kitchen  HYDROcodone-acetaminophen (NORCO) 7.5-325 MG tablet Take 1 tablet by mouth every 6 (six) hours as needed for moderate pain.    Marland Kitchen lisinopril (ZESTRIL) 40 MG tablet Take 1 tablet by mouth daily.    Marland Kitchen lovastatin (MEVACOR) 20 MG tablet Take 1 tablet by mouth at bedtime.    Marland Kitchen omeprazole (PRILOSEC) 20 MG capsule Take 1 capsule by mouth daily.    Marland Kitchen oxybutynin (DITROPAN-XL) 10 MG 24 hr tablet Take 10 mg by mouth daily.     No current facility-administered medications for this visit.     Allergies as of 04/11/2019 - Review Complete 04/11/2019  Allergen Reaction Noted  . Clindamycin/lincomycin Rash 10/27/2015  . Erythromycin Itching and Rash 10/27/2015    Family History  Problem Relation Age of Onset  . Emphysema Mother   . Hypertension Father   . Cancer Sister        Ovarian  . Cancer Maternal Grandmother   . Cancer Maternal Grandfather   . Colon cancer Neg Hx     Social History   Socioeconomic History  . Marital status: Single    Spouse name: Not on file  . Number of children: Not on file  . Years of education: Not on file  . Highest education level: Not on file  Occupational History  . Not on file  Social Needs  . Financial resource strain: Not on file  . Food insecurity    Worry: Not on file    Inability: Not on file  . Transportation needs    Medical: Not on file    Non-medical: Not on file  Tobacco Use  . Smoking status: Current Every Day Smoker    Packs/day: 0.50    Years: 35.00    Pack years: 17.50  . Smokeless tobacco: Never Used  Substance and Sexual Activity  . Alcohol use: No    Alcohol/week: 0.0 standard drinks  . Drug use: Not Currently    Types: Cocaine  . Sexual activity: Not on file  Lifestyle  . Physical activity    Days per week: Not on file    Minutes per session: Not on file  . Stress: Not on file  Relationships  . Social Herbalist on phone: Not on file    Gets together: Not on file    Attends religious service: Not on file     Active member of club or organization: Not on file    Attends meetings of clubs or organizations: Not on file    Relationship status: Not on file  . Intimate partner violence    Fear of current or ex partner: Not on file    Emotionally abused: Not on file    Physically abused: Not on file    Forced sexual activity: Not on file  Other Topics Concern  . Not on file  Social History Narrative   Widowed   Unemployed    Review of Systems: General: Negative for anorexia, weight loss, fever, chills, fatigue, weakness. ENT: Negative for hoarseness, difficulty swallowing. CV: Negative for chest  pain, angina, palpitations, peripheral edema.  Respiratory: Negative for dyspnea at rest, cough, sputum, wheezing.  GI: See history of present illness. MS: Negative for joint pain, low back pain.  Derm: Negative for rash or itching.  Endo: Negative for unusual weight change.  Heme: Negative for bruising or bleeding. Allergy: Negative for rash or hives.    Physical Exam: BP 94/63   Pulse 83   Temp 98 F (36.7 C) (Oral)   Ht 5\' 3"  (1.6 m)   Wt 192 lb 6.4 oz (87.3 kg)   BMI 34.08 kg/m  General:   Alert and oriented. Pleasant and cooperative. Well-nourished and well-developed.  Eyes:  Without icterus, sclera clear and conjunctiva pink.  Ears:  Normal auditory acuity. Cardiovascular:  S1, S2 present without murmurs appreciated. Extremities without clubbing or edema. Respiratory:  Clear to auscultation bilaterally. No wheezes, rales, or rhonchi. No distress.  Gastrointestinal:  +BS, soft, non-tender and non-distended. No HSM noted. No guarding or rebound. No masses appreciated.  Rectal:  Deferred  Musculoskalatal:  Symmetrical without gross deformities. Neurologic:  Alert and oriented x4;  grossly normal neurologically. Psych:  Alert and cooperative. Normal mood and affect. Heme/Lymph/Immune: No excessive bruising noted.    04/11/2019 2:00 PM   Disclaimer: This note was dictated with  voice recognition software. Similar sounding words can inadvertently be transcribed and may not be corrected upon review.

## 2019-04-12 ENCOUNTER — Telehealth: Payer: Self-pay | Admitting: Internal Medicine

## 2019-04-12 NOTE — Telephone Encounter (Signed)
Pt notified of EG's recommendation and will contact our office if she has any of the symptoms listed below.

## 2019-04-12 NOTE — Telephone Encounter (Signed)
Possibly Pepto related. Monitor for any further black stools. Let us know if she has any worsening fatigue, weakness, dizziness, chest pain, shortness of breath.  If she has a recurrence will likely need a CBC and/or iFOBT

## 2019-04-12 NOTE — Telephone Encounter (Signed)
Pt was seen in office yesterday. This morning she said she had a black stool with sharp pain. Please call [phone]

## 2019-04-12 NOTE — Telephone Encounter (Signed)
Spoke with pt. She noticed that her stool was black this morning. Pt didn't see any active blood in the toilet or with wiping. Stool was formed and not in diarrhea form. Pt didn't notice mucus in the toilet.  Pt took a small amount of Pepo Bismol 4 days ago. Since pt called, her stomach pain that she was having has subsided. Please advise.

## 2019-04-12 NOTE — Progress Notes (Signed)
cc'ed to pcp °

## 2019-04-16 ENCOUNTER — Encounter: Payer: Self-pay | Admitting: *Deleted

## 2019-04-25 DIAGNOSIS — E782 Mixed hyperlipidemia: Secondary | ICD-10-CM | POA: Diagnosis not present

## 2019-04-25 DIAGNOSIS — I10 Essential (primary) hypertension: Secondary | ICD-10-CM | POA: Diagnosis not present

## 2019-04-25 DIAGNOSIS — M5030 Other cervical disc degeneration, unspecified cervical region: Secondary | ICD-10-CM | POA: Diagnosis not present

## 2019-04-25 DIAGNOSIS — R7301 Impaired fasting glucose: Secondary | ICD-10-CM | POA: Diagnosis not present

## 2019-05-08 ENCOUNTER — Ambulatory Visit: Payer: Self-pay | Admitting: "Endocrinology

## 2019-06-11 ENCOUNTER — Telehealth: Payer: Self-pay | Admitting: Internal Medicine

## 2019-06-11 NOTE — Telephone Encounter (Signed)
Called patient. She needs a mondy or Tuesday. Scheduled for 12/7 at 10:45am. Patient aware will mail new pre-op appt with new covid-19 testing appt. Confirmed address. Called endo and made aware.

## 2019-06-11 NOTE — Telephone Encounter (Signed)
Pt is needing to reschedule her procedure with [facility] on 9/24 to either a Monday or Tuesday. [phone]

## 2019-06-12 ENCOUNTER — Other Ambulatory Visit (HOSPITAL_COMMUNITY)
Admission: RE | Admit: 2019-06-12 | Discharge: 2019-06-12 | Disposition: A | Discharge status: Home / Self Care | Payer: Medicare Other | Source: Ambulatory Visit | Attending: Internal Medicine | Admitting: Internal Medicine

## 2019-06-12 ENCOUNTER — Encounter (HOSPITAL_COMMUNITY): Admission: RE | Admit: 2019-06-12 | Payer: Medicare Other | Source: Ambulatory Visit

## 2019-06-14 DIAGNOSIS — I10 Essential (primary) hypertension: Secondary | ICD-10-CM | POA: Diagnosis not present

## 2019-06-14 DIAGNOSIS — E782 Mixed hyperlipidemia: Secondary | ICD-10-CM | POA: Diagnosis not present

## 2019-06-14 DIAGNOSIS — R7301 Impaired fasting glucose: Secondary | ICD-10-CM | POA: Diagnosis not present

## 2019-06-18 ENCOUNTER — Encounter: Payer: Self-pay | Admitting: "Endocrinology

## 2019-06-18 ENCOUNTER — Ambulatory Visit (INDEPENDENT_AMBULATORY_CARE_PROVIDER_SITE_OTHER): Payer: Medicare Other | Admitting: "Endocrinology

## 2019-06-18 ENCOUNTER — Other Ambulatory Visit: Payer: Self-pay

## 2019-06-18 VITALS — BP 99/62 | HR 72 | Ht 63.0 in | Wt 196.0 lb

## 2019-06-18 DIAGNOSIS — L689 Hypertrichosis, unspecified: Secondary | ICD-10-CM | POA: Insufficient documentation

## 2019-06-18 DIAGNOSIS — L680 Hirsutism: Secondary | ICD-10-CM | POA: Diagnosis not present

## 2019-06-18 NOTE — Progress Notes (Signed)
Endocrinology Consult Note                                            06/18/2019, 5:14 PM   Subjective:    Patient ID: Stacey Brooks, female    DOB: February 20, 1961, PCP Stacey Squibb, MD   Past Medical History:  Diagnosis Date  . ANA positive   . Anxiety   . Asthma    Intermittent; asthmatic bronchitis  . Back pain 06/25/2011  . Carpal tunnel syndrome on right 10/11/2008  . Cervical nerve root impingement 10/03/2012  . Cervical spondylolysis 10/03/2012  . Colon polyps 08/25/2012  . Constipation   . COPD (chronic obstructive pulmonary disease) ([place]) 06/25/2011  . De Quervain's disease (tenosynovitis) 07/02/2013  . Depression 06/25/2011  . Diverticulosis 08/25/2012  . Edema 07/03/2003  . Excessive hair 08/22/2012   body and facial   . Fatty liver 06/10/2015  . Fibromyalgia 06/30/2009  . GERD (gastroesophageal reflux disease) 07/03/2003  . H/O bladder repair surgery 02/23/2013  . H/O neck surgery 10/03/2012  . Hemorrhoids 07/01/2014  . Hirsutism   . Hypercholesterolemia 07/05/2003   Mild  . Hyperlipidemia   . IBS (irritable bowel syndrome) 2010  . Incontinence of urine in female 09/09/2003  . Insomnia 2010  . Lumbar degenerative disc disease   . Migraine 10/13/2011  . Neural foraminal stenosis of cervical spine   . Obesity 08/23/2013  . Obstructive chronic bronchitis ([place])   . Osteoarthritis 06/25/2011  . Pilar cyst 07/30/2011  . Polymyalgia ([place]) 03/31/2009  . SS-A antibody positive 08/22/2012  . Suicide attempt ([place]) 06/25/2011  . Thyroid disease    Hypothyroidism  . Tobacco abuse 07/03/2003  . Trigger middle finger 05/30/2008   Past Surgical History:  Procedure Laterality Date  . ABDOMINAL HYSTERECTOMY  04/04/2013  . BACK SURGERY  2007  . CARPECTOMY WITH RADIAL STYLOIDECTOMY  01/05/2012   DeQuervain's Release   . CERVICAL FUSION  2007  . WRIST ARTHROSCOPY  01/05/2012   release of carpal ligament   Social History   Socioeconomic History  . Marital status: Single     Spouse name: Not on file  . Number of children: Not on file  . Years of education: Not on file  . Highest education level: Not on file  Occupational History  . Not on file  Social Needs  . Financial resource strain: Not on file  . Food insecurity    Worry: Not on file    Inability: Not on file  . Transportation needs    Medical: Not on file    Non-medical: Not on file  Tobacco Use  . Smoking status: Current Every Day Smoker    Packs/day: 0.50    Years: 35.00    Pack years: 17.50  . Smokeless tobacco: Never Used  Substance and Sexual Activity  . Alcohol use: No    Alcohol/week: 0.0 standard drinks  . Drug use: Not Currently    Types: Cocaine  . Sexual activity: Not on file  Lifestyle  . Physical activity    Days per week: Not on file    Minutes per session: Not on file  . Stress: Not on file  Relationships  . Social Herbalist on phone: Not on file    Gets together: Not on file    Attends religious service: Not on  file    Active member of club or organization: Not on file    Attends meetings of clubs or organizations: Not on file    Relationship status: Not on file  Other Topics Concern  . Not on file  Social History Narrative   Widowed   Unemployed   Family History  Problem Relation Age of Onset  . Emphysema Mother   . Hypertension Father   . Cancer Sister        Ovarian  . Cancer Maternal Grandmother   . Cancer Maternal Grandfather   . Colon cancer Neg Hx    Outpatient Encounter Medications as of 06/18/2019  Medication Sig  . albuterol (PROVENTIL HFA;VENTOLIN HFA) 108 (90 Base) MCG/ACT inhaler Inhale 2 puffs into the lungs every 6 (six) hours as needed for wheezing or shortness of breath.  Marland Kitchen amLODipine (NORVASC) 5 MG tablet Take 5 mg by mouth daily.   Marland Kitchen escitalopram (LEXAPRO) 5 MG tablet Take 5 mg by mouth daily.  . hydrALAZINE (APRESOLINE) 25 MG tablet Take 25 mg by mouth 2 (two) times daily.  . hydrochlorothiazide (HYDRODIURIL) 25 MG tablet  Take 25 mg by mouth daily.  Marland Kitchen lisinopril (ZESTRIL) 40 MG tablet Take 40 mg by mouth daily.   Marland Kitchen lovastatin (MEVACOR) 20 MG tablet Take 20 mg by mouth at bedtime.   Marland Kitchen omeprazole (PRILOSEC) 20 MG capsule Take 1 capsule (20 mg total) by mouth 2 (two) times daily before a meal. (Patient taking differently: Take 20 mg by mouth daily. )  . oxybutynin (DITROPAN-XL) 10 MG 24 hr tablet Take 10 mg by mouth daily.   No facility-administered encounter medications on file as of 06/18/2019.    ALLERGIES: Allergies  Allergen Reactions  . Clindamycin/Lincomycin Rash  . Erythromycin Itching and Rash    VACCINATION STATUS:  There is no immunization history on file for this patient.  HPI Stacey Brooks is 58 y.o. female who presents today with a medical history as above. she is being seen in consultation for hirsutism requested by Stacey Squibb, MD.  -History is obtained directly from the patient.  She denies any prior history of hyperandrogenism.  She is a mother of 3 grown kids age 49, 25, 41.  She reports that she has dealt with hirsutism almost all of her adult life, has gotten slightly worse since her total hysterectomy in 2001 for abnormal vaginal bleeding/menometrorrhagia. -She reports hair growth on both upper extremities, sideburns, mustache and chin.  She tweezes on and off to get rid of hair on her face. -She is a chronic heavy smoker.  -She was never offered treatment for hyperandrogenism/hirsutism.  She denies recent weight change. -She denies any heavy dose chronic steroid exposure.  Review of Systems  Constitutional: no recent weight gain/loss, no fatigue, no subjective hyperthermia, no subjective hypothermia Eyes: no blurry vision, no xerophthalmia ENT: no sore throat, no nodules palpated in throat, no dysphagia/odynophagia, no hoarseness Cardiovascular: no Chest Pain, no Shortness of Breath, no palpitations, no leg swelling Respiratory: +cough, no shortness of breath Gastrointestinal:  no Nausea/Vomiting/Diarhhea Musculoskeletal: no muscle/joint aches Skin: no rashes Neurological: no tremors, no numbness, no tingling, no dizziness Psychiatric: no depression, no anxiety  Objective:    BP 99/62   Pulse 72   Ht 5\' 3"  (1.6 m)   Wt 196 lb (88.9 kg)   BMI 34.72 kg/m   Wt Readings from Last 3 Encounters:  06/18/19 196 lb (88.9 kg)  04/11/19 192 lb 6.4 oz (87.3 kg)  Physical Exam  Constitutional:  Body mass index is 34.72 kg/m.,  not in acute distress, normal state of mind Eyes: PERRLA, EOMI, no exophthalmos ENT: moist mucous membranes, no gross thyromegaly, no gross cervical lymphadenopathy, + temporal hair on sideburns, + chin, + mustache, no baldness Cardiovascular: normal precordial activity, Regular Rate and Rhythm, no Murmur/Rubs/Gallops Respiratory:  adequate breathing efforts, no gross chest deformity, Clear to auscultation bilaterally Gastrointestinal: abdomen soft, Non -tender, No distension, Bowel Sounds present, no gross organomegaly Musculoskeletal:  , + Terminal hair on bilateral extremities, no gross deformities, strength intact in all four extremities Skin: moist, warm, no rashes Neurological: no tremor with outstretched hands, Deep tendon reflexes normal in bilateral lower extremities.  CMP ( most recent) CMP     Component Value Date/Time   NA 141 10/01/2015   K 4.2 10/01/2015   BUN 11 10/01/2015   CREATININE 0.8 10/01/2015   AST 17 10/01/2015   ALT 16 10/01/2015   ALKPHOS 139 (A) 10/01/2015     Diabetic Labs (most recent): Lab Results  Component Value Date   HGBA1C 5.8 10/01/2015     Lipid Panel ( most recent) Lipid Panel     Component Value Date/Time   CHOL 243 (A) 10/01/2015   TRIG 192 (A) 10/01/2015   HDL 40 10/01/2015   LDLCALC 164 10/01/2015      Lab Results  Component Value Date   TSH 1.06 10/01/2015       Assessment & Plan:   1.  Idiopathic hypertrichosis  - KAHMIYA MCMULLIN  is being seen at a kind request  of [place] Crane, Edwinna Areola, MD. - I have reviewed her available endocrine records and clinically evaluated the patient. -She has a diffuse hypertrichosis, no baldness.  She reports that she had this condition all of her adult life. -Underlying neoplastic process is unlikely, however she will be sent for baseline endocrine work-up including total and free testosterone, prolactin, DHEA sulfate as well as a.m. cortisol. -She is encouraged to continue mechanical brain volume care with twitching especially on his mustache and chin. -If her labs indicate possible source of hyperthyroidism, she  may need imaging studies of pelvis/abdomen. -She will be considered for antiandrogens if she is found to have mild hyperandrogenism without specific source.  -she will return in 10 days to review her repeat labs. -She is extensively counseled for smoking cessation.  - I did not initiate any new prescriptions today. - she is advised to maintain close follow up with Stacey Squibb, MD for primary care needs.   - Time spent with the patient: 45 minutes, of which >50% was spent in obtaining information about her symptoms, reviewing her previous labs/studies,  evaluations, and treatments, counseling her about her hypertrichosis, and developing a plan to confirm the diagnosis and long term treatment based on the latest standards of care/guidelines.    Stacey Brooks participated in the discussions, expressed understanding, and voiced agreement with the above plans.  All questions were answered to her satisfaction. she is encouraged to contact clinic should she have any questions or concerns prior to her return visit.  Follow up plan: Return in about 10 days (around 06/28/2019), or fastin before 8AM, for Follow up with Pre-visit Labs.   Glade Lloyd, MD [facility] [address] [place], [place] [address] Phone: [phone]  Fax: [phone]     06/18/2019, 5:14  PM  This note was partially dictated with voice recognition software. Similar sounding words can  be transcribed inadequately or may not  be corrected upon review.

## 2019-06-27 DIAGNOSIS — I10 Essential (primary) hypertension: Secondary | ICD-10-CM | POA: Diagnosis not present

## 2019-06-27 DIAGNOSIS — R7301 Impaired fasting glucose: Secondary | ICD-10-CM | POA: Diagnosis not present

## 2019-06-27 DIAGNOSIS — E782 Mixed hyperlipidemia: Secondary | ICD-10-CM | POA: Diagnosis not present

## 2019-06-28 ENCOUNTER — Ambulatory Visit: Payer: Medicare Other | Admitting: "Endocrinology

## 2019-07-02 DIAGNOSIS — R5383 Other fatigue: Secondary | ICD-10-CM | POA: Diagnosis not present

## 2019-07-02 DIAGNOSIS — L680 Hirsutism: Secondary | ICD-10-CM | POA: Diagnosis not present

## 2019-07-04 ENCOUNTER — Ambulatory Visit (INDEPENDENT_AMBULATORY_CARE_PROVIDER_SITE_OTHER): Payer: Medicare Other | Admitting: "Endocrinology

## 2019-07-04 ENCOUNTER — Encounter: Payer: Self-pay | Admitting: "Endocrinology

## 2019-07-04 ENCOUNTER — Other Ambulatory Visit: Payer: Self-pay

## 2019-07-04 DIAGNOSIS — L680 Hirsutism: Secondary | ICD-10-CM | POA: Diagnosis not present

## 2019-07-04 DIAGNOSIS — L689 Hypertrichosis, unspecified: Secondary | ICD-10-CM

## 2019-07-04 NOTE — Progress Notes (Signed)
07/04/2019, 4:56 PM                                Endocrinology Telehealth Visit Follow up Note -During COVID -19 Pandemic  I connected with Stacey Brooks on 07/04/2019   by telephone and verified that I am speaking with the correct person using two identifiers. Stacey Brooks, November 03, 1960. she has verbally consented to this visit. All issues noted in this document were discussed and addressed. The format was not optimal for physical exam.   Subjective:    Patient ID: Stacey Brooks, female    DOB: Mar 10, 1961, PCP Stacey Squibb, MD   Past Medical History:  Diagnosis Date  . ANA positive   . Anxiety   . Asthma    Intermittent; asthmatic bronchitis  . Back pain 06/25/2011  . Carpal tunnel syndrome on right 10/11/2008  . Cervical nerve root impingement 10/03/2012  . Cervical spondylolysis 10/03/2012  . Colon polyps 08/25/2012  . Constipation   . COPD (chronic obstructive pulmonary disease) ([place]) 06/25/2011  . De Quervain's disease (tenosynovitis) 07/02/2013  . Depression 06/25/2011  . Diverticulosis 08/25/2012  . Edema 07/03/2003  . Excessive hair 08/22/2012   body and facial   . Fatty liver 06/10/2015  . Fibromyalgia 06/30/2009  . GERD (gastroesophageal reflux disease) 07/03/2003  . H/O bladder repair surgery 02/23/2013  . H/O neck surgery 10/03/2012  . Hemorrhoids 07/01/2014  . Hirsutism   . Hypercholesterolemia 07/05/2003   Mild  . Hyperlipidemia   . IBS (irritable bowel syndrome) 2010  . Incontinence of urine in female 09/09/2003  . Insomnia 2010  . Lumbar degenerative disc disease   . Migraine 10/13/2011  . Neural foraminal stenosis of cervical spine   . Obesity 08/23/2013  . Obstructive chronic bronchitis ([place])   . Osteoarthritis 06/25/2011  . Pilar cyst 07/30/2011  . Polymyalgia ([place]) 03/31/2009  . SS-A antibody positive 08/22/2012  . Suicide attempt ([place]) 06/25/2011  . Thyroid disease    Hypothyroidism  . Tobacco abuse  07/03/2003  . Trigger middle finger 05/30/2008   Past Surgical History:  Procedure Laterality Date  . ABDOMINAL HYSTERECTOMY  04/04/2013  . BACK SURGERY  2007  . CARPECTOMY WITH RADIAL STYLOIDECTOMY  01/05/2012   DeQuervain's Release   . CERVICAL FUSION  2007  . WRIST ARTHROSCOPY  01/05/2012   release of carpal ligament   Social History   Socioeconomic History  . Marital status: Single    Spouse name: Not on file  . Number of children: Not on file  . Years of education: Not on file  . Highest education level: Not on file  Occupational History  . Not on file  Social Needs  . Financial resource strain: Not on file  . Food insecurity    Worry: Not on file    Inability: Not on file  . Transportation needs    Medical: Not on file    Non-medical: Not on file  Tobacco Use  . Smoking status: Current Every Day Smoker    Packs/day: 0.50    Years: 35.00    Pack years: 17.50  . Smokeless tobacco: Never Used  Substance  and Sexual Activity  . Alcohol use: No    Alcohol/week: 0.0 standard drinks  . Drug use: Not Currently    Types: Cocaine  . Sexual activity: Not on file  Lifestyle  . Physical activity    Days per week: Not on file    Minutes per session: Not on file  . Stress: Not on file  Relationships  . Social Herbalist on phone: Not on file    Gets together: Not on file    Attends religious service: Not on file    Active member of club or organization: Not on file    Attends meetings of clubs or organizations: Not on file    Relationship status: Not on file  Other Topics Concern  . Not on file  Social History Narrative   Widowed   Unemployed   Family History  Problem Relation Age of Onset  . Emphysema Mother   . Hypertension Father   . Cancer Sister        Ovarian  . Cancer Maternal Grandmother   . Cancer Maternal Grandfather   . Colon cancer Neg Hx    Outpatient Encounter Medications as of 07/04/2019  Medication Sig  . albuterol (PROVENTIL  HFA;VENTOLIN HFA) 108 (90 Base) MCG/ACT inhaler Inhale 2 puffs into the lungs every 6 (six) hours as needed for wheezing or shortness of breath.  Marland Kitchen amLODipine (NORVASC) 5 MG tablet Take 5 mg by mouth daily.   Marland Kitchen escitalopram (LEXAPRO) 5 MG tablet Take 5 mg by mouth daily.  . hydrALAZINE (APRESOLINE) 25 MG tablet Take 25 mg by mouth 2 (two) times daily.  . hydrochlorothiazide (HYDRODIURIL) 25 MG tablet Take 25 mg by mouth daily.  Marland Kitchen lisinopril (ZESTRIL) 40 MG tablet Take 40 mg by mouth daily.   Marland Kitchen lovastatin (MEVACOR) 20 MG tablet Take 20 mg by mouth at bedtime.   Marland Kitchen omeprazole (PRILOSEC) 20 MG capsule Take 1 capsule (20 mg total) by mouth 2 (two) times daily before a meal. (Patient taking differently: Take 20 mg by mouth daily. )  . oxybutynin (DITROPAN-XL) 10 MG 24 hr tablet Take 10 mg by mouth daily.   No facility-administered encounter medications on file as of 07/04/2019.    ALLERGIES: Allergies  Allergen Reactions  . Clindamycin/Lincomycin Rash  . Erythromycin Itching and Rash    VACCINATION STATUS:  There is no immunization history on file for this patient.  HPI Stacey Brooks is 58 y.o. female who presents today with a medical history as above. she is being engaged in telehealth via telephone after she was seen in consultation for  hirsutism requested by Stacey Squibb, MD.  -History is obtained directly from the patient.  She denies any prior history of hyperandrogenism.  She is a mother of 3 grown kids age 18, 2, 43.  She reports that she has dealt with hirsutism almost all of her adult life, has gotten slightly worse since her total hysterectomy in 2001 for abnormal vaginal bleeding/menometrorrhagia. -She reports hair growth on both upper extremities, sideburns, mustache and chin.  She tweezes on and off to get rid of hair on her face. -She is a chronic heavy smoker.  -She was never offered treatment for hyperandrogenism/hirsutism.  She denies recent weight change. -She denies any  heavy dose chronic steroid exposure. She has no new complaints since last visit.  Review of Systems Limited as above.  Objective:    There were no vitals taken for this visit.  Wt Readings  from Last 3 Encounters:  06/18/19 196 lb (88.9 kg)  04/11/19 192 lb 6.4 oz (87.3 kg)    Physical Exam  CMP ( most recent) CMP     Component Value Date/Time   NA 141 10/01/2015   K 4.2 10/01/2015   BUN 11 10/01/2015   CREATININE 0.8 10/01/2015   AST 17 10/01/2015   ALT 16 10/01/2015   ALKPHOS 139 (A) 10/01/2015     Diabetic Labs (most recent): Lab Results  Component Value Date   HGBA1C 5.8 10/01/2015     Lipid Panel ( most recent) Lipid Panel     Component Value Date/Time   CHOL 243 (A) 10/01/2015   TRIG 192 (A) 10/01/2015   HDL 40 10/01/2015   LDLCALC 164 10/01/2015        Recent Results (from the past 2160 hour(s))  Prolactin     Status: None   Collection Time: 07/02/19  8:20 AM  Result Value Ref Range   Prolactin 11.6 ng/mL    Comment:             Reference Range  Females         Non-pregnant        3.0-30.0         Pregnant           10.0-209.0         Postmenopausal      2.0-20.0 . . .   T4, free     Status: None   Collection Time: 07/02/19  8:20 AM  Result Value Ref Range   Free T4 1.1 0.8 - 1.8 ng/dL  TSH     Status: None   Collection Time: 07/02/19  8:20 AM  Result Value Ref Range   TSH 0.92 0.40 - 4.50 mIU/L  Cortisol-am, blood     Status: None   Collection Time: 07/02/19  8:20 AM  Result Value Ref Range   Cortisol - AM 19.9 mcg/dL    Comment: Reference Range 8 a.m. (7-9 a.m.) Specimen: 4.0-22.0 .   DHEA-sulfate     Status: None   Collection Time: 07/02/19  8:20 AM  Result Value Ref Range   DHEA-SO4 170 8 - 188 mcg/dL    Comment: . DHEA-S values fall with advancing age. For reference, the reference intervals for 50-32 year old patients are: Marland Kitchen Female:   [id number] mcg/dL Female:  23-266 mcg/dL .      Assessment & Plan:   1.  Idiopathic  hypertrichosis   - I have reviewed her move endocrine work-up and prior history.   -She has a diffuse hypertrichosis, no baldness.  She reports that she had this condition all of her adult life. -Her work-up rules out hyperandrogenism, Cushing's syndrome, nor thyroid dysfunction. -Underlying neoplastic process is unlikely.  She would not require any antiandrogen therapy at this time. -She is encouraged to continue mechanical hair removal especially on her mustache and chin. -Her history is that of hypertrichosis on most of her adult life. -She is extensively counseled for smoking cessation.  - I did not initiate any new prescriptions today. Patient will return as needed for new findings. - she is advised to maintain close follow up with Stacey Squibb, MD for primary care needs.  Time for this visit: 15 minutes. Stacey Brooks  participated in the discussions, expressed understanding, and voiced agreement with the above plans.  All questions were answered to her satisfaction. she is encouraged to contact clinic should she have any questions  or concerns prior to her return visit.   Follow up plan: Return if symptoms worsen or fail to improve.   Glade Lloyd, MD [facility] [address] [place], [place] [address] Phone: [phone]  Fax: [phone]     07/04/2019, 4:56 PM  This note was partially dictated with voice recognition software. Similar sounding words can be transcribed inadequately or may not  be corrected upon review.

## 2019-07-05 LAB — PROLACTIN: Prolactin: 11.6 ng/mL

## 2019-07-05 LAB — DHEA-SULFATE: DHEA-SO4: 170 ug/dL (ref 8–188)

## 2019-07-05 LAB — TSH: TSH: 0.92 mIU/L (ref 0.40–4.50)

## 2019-07-05 LAB — TESTOS,TOTAL,FREE AND SHBG (FEMALE)
Free Testosterone: 4.7 pg/mL (ref 0.1–6.4)
Sex Hormone Binding: 36 nmol/L (ref 14–73)
Testosterone, Total, LC-MS-MS: 35 ng/dL (ref 2–45)

## 2019-07-05 LAB — T4, FREE: Free T4: 1.1 ng/dL (ref 0.8–1.8)

## 2019-07-05 LAB — CORTISOL-AM, BLOOD: Cortisol - AM: 19.9 ug/dL

## 2019-07-12 ENCOUNTER — Ambulatory Visit: Payer: Medicare Other | Admitting: Nurse Practitioner

## 2019-07-31 ENCOUNTER — Encounter: Payer: Self-pay | Admitting: Gastroenterology

## 2019-07-31 ENCOUNTER — Other Ambulatory Visit: Payer: Self-pay

## 2019-07-31 ENCOUNTER — Ambulatory Visit (INDEPENDENT_AMBULATORY_CARE_PROVIDER_SITE_OTHER): Payer: Medicare Other | Admitting: Gastroenterology

## 2019-07-31 DIAGNOSIS — K625 Hemorrhage of anus and rectum: Secondary | ICD-10-CM | POA: Diagnosis not present

## 2019-07-31 DIAGNOSIS — R197 Diarrhea, unspecified: Secondary | ICD-10-CM | POA: Diagnosis not present

## 2019-07-31 DIAGNOSIS — K219 Gastro-esophageal reflux disease without esophagitis: Secondary | ICD-10-CM

## 2019-07-31 MED ORDER — DICYCLOMINE HCL 10 MG PO CAPS: 10.0000 mg | ORAL_CAPSULE | Freq: Three times a day (TID) | ORAL | 1 refills | Status: DC

## 2019-07-31 NOTE — Progress Notes (Signed)
Primary Care Physician:  Celene Squibb, MD Primary GI:  Garfield Cornea, MD   Patient Location: Home  Provider Location: [place] office  Reason for Visit: diarrhea, abd pain, blood in stool  Persons present on the virtual encounter, with roles: Patient, myself (provider),Mindy Estudillo, CMA (updated meds and allergies)  Total time (minutes) spent on medical discussion: 15 minutes   Due to COVID-19, visit was conducted using [email or web address] method.  Visit was requested by patient.  Virtual Visit via [email or web address]  I connected with Stacey Brooks on 07/31/19 at  9:30 AM EST by [email or web address] and verified that I am speaking with the correct person using two identifiers.   I discussed the limitations, risks, security and privacy concerns of performing an evaluation and management service by telephone/video and the availability of in person appointments. I also discussed with the patient that there may be a patient responsible charge related to this service. The patient expressed understanding and agreed to proceed.  Chief Complaint  Patient presents with  . heme +    f/u. seen more blood in stool recently. Can have diarrhea for 3-4 days then will have constipation. x 1 month having accidents on self with diarrhea  . Gastroesophageal Reflux    f/u. Reports water can cause heartburn    HPI:   Stacey Brooks is a 58 y.o. female who presents for virtual visit for follow-up.  She was seen back in July for heme positive stools and GERD.  She was also having diarrhea for 2 months.  Currently scheduled for colonoscopy on December 7.  She had been scheduled in September but she needed to reschedule for personal reasons.  Patient called and made this appointment because she has been seeing a little bit more blood than usual when she has a bowel movement.  It started several weeks after her last visit here.  She is having fresh blood per rectum, mostly on the toilet tissue.  A little more lightheadedness than  usual.  Denies any melena.  She continues to have diarrhea which she has had for several months now.  Generally only 1 or 2 stools per day but has a lot of urgency which is led to some accidents.  No nocturnal diarrhea.  She does have some solid stools intermittently.  She may been go a couple of days without a bowel movement at all.  Denies any hard stools.  In the past she was predominantly constipated but being switched over the past several months.    For the most part her reflux is better on omeprazole twice daily.  She states about once a week she may have refractory symptoms but this typically occurs if she eats something wrong or she forgets to take her omeprazole.  No vomiting.  No dysphagia.     Current Outpatient Medications  Medication Sig Dispense Refill  . albuterol (PROVENTIL HFA;VENTOLIN HFA) 108 (90 Base) MCG/ACT inhaler Inhale 2 puffs into the lungs every 6 (six) hours as needed for wheezing or shortness of breath.    Marland Kitchen amLODipine (NORVASC) 5 MG tablet Take 5 mg by mouth daily.     Marland Kitchen escitalopram (LEXAPRO) 5 MG tablet Take 5 mg by mouth daily.    . hydrALAZINE (APRESOLINE) 25 MG tablet Take 25 mg by mouth 2 (two) times daily.    . hydrochlorothiazide (HYDRODIURIL) 25 MG tablet Take 25 mg by mouth daily.    Marland Kitchen lisinopril (ZESTRIL) 40 MG tablet Take 40 mg by  mouth daily.     Marland Kitchen lovastatin (MEVACOR) 20 MG tablet Take 20 mg by mouth at bedtime.     Marland Kitchen omeprazole (PRILOSEC) 20 MG capsule Take 1 capsule (20 mg total) by mouth 2 (two) times daily before a meal. (Patient taking differently: Take 20 mg by mouth daily. ) 60 capsule 3  . oxybutynin (DITROPAN-XL) 10 MG 24 hr tablet Take 10 mg by mouth daily.     No current facility-administered medications for this visit.     Past Medical History:  Diagnosis Date  . ANA positive   . Anxiety   . Asthma    Intermittent; asthmatic bronchitis  . Back pain 06/25/2011  . Carpal tunnel syndrome on right 10/11/2008  . Cervical nerve root  impingement 10/03/2012  . Cervical spondylolysis 10/03/2012  . Colon polyps 08/25/2012  . Constipation   . COPD (chronic obstructive pulmonary disease) ([place]) 06/25/2011  . De Quervain's disease (tenosynovitis) 07/02/2013  . Depression 06/25/2011  . Diverticulosis 08/25/2012  . Edema 07/03/2003  . Excessive hair 08/22/2012   body and facial   . Fatty liver 06/10/2015  . Fibromyalgia 06/30/2009  . GERD (gastroesophageal reflux disease) 07/03/2003  . H/O bladder repair surgery 02/23/2013  . H/O neck surgery 10/03/2012  . Hemorrhoids 07/01/2014  . Hirsutism   . Hypercholesterolemia 07/05/2003   Mild  . Hyperlipidemia   . IBS (irritable bowel syndrome) 2010  . Incontinence of urine in female 09/09/2003  . Insomnia 2010  . Lumbar degenerative disc disease   . Migraine 10/13/2011  . Neural foraminal stenosis of cervical spine   . Obesity 08/23/2013  . Obstructive chronic bronchitis ([place])   . Osteoarthritis 06/25/2011  . Pilar cyst 07/30/2011  . Polymyalgia ([place]) 03/31/2009  . SS-A antibody positive 08/22/2012  . Suicide attempt ([place]) 06/25/2011  . Thyroid disease    Hypothyroidism  . Tobacco abuse 07/03/2003  . Trigger middle finger 05/30/2008    Past Surgical History:  Procedure Laterality Date  . ABDOMINAL HYSTERECTOMY  04/04/2013  . BACK SURGERY  2007  . CARPECTOMY WITH RADIAL STYLOIDECTOMY  01/05/2012   DeQuervain's Release   . CERVICAL FUSION  2007  . WRIST ARTHROSCOPY  01/05/2012   release of carpal ligament    Family History  Problem Relation Age of Onset  . Emphysema Mother   . Hypertension Father   . Cancer Sister        Ovarian  . Cancer Maternal Grandmother   . Cancer Maternal Grandfather   . Colon cancer Neg Hx     Social History   Socioeconomic History  . Marital status: Single    Spouse name: Not on file  . Number of children: Not on file  . Years of education: Not on file  . Highest education level: Not on file  Occupational History  . Not on file  Social  Needs  . Financial resource strain: Not on file  . Food insecurity    Worry: Not on file    Inability: Not on file  . Transportation needs    Medical: Not on file    Non-medical: Not on file  Tobacco Use  . Smoking status: Current Every Day Smoker    Packs/day: 0.50    Years: 35.00    Pack years: 17.50  . Smokeless tobacco: Never Used  Substance and Sexual Activity  . Alcohol use: No    Alcohol/week: 0.0 standard drinks  . Drug use: Not Currently    Types: Cocaine  . Sexual activity:  Not on file  Lifestyle  . Physical activity    Days per week: Not on file    Minutes per session: Not on file  . Stress: Not on file  Relationships  . Social Herbalist on phone: Not on file    Gets together: Not on file    Attends religious service: Not on file    Active member of club or organization: Not on file    Attends meetings of clubs or organizations: Not on file    Relationship status: Not on file  . Intimate partner violence    Fear of current or ex partner: Not on file    Emotionally abused: Not on file    Physically abused: Not on file    Forced sexual activity: Not on file  Other Topics Concern  . Not on file  Social History Narrative   Widowed   Unemployed      ROS:  General: Negative for anorexia, weight loss, fever, chills, fatigue, weakness.  Some lightheadedness. Eyes: Negative for vision changes.  ENT: Negative for hoarseness, difficulty swallowing , nasal congestion. CV: Negative for chest pain, angina, palpitations, dyspnea on exertion, peripheral edema.  Respiratory: Negative for dyspnea at rest, dyspnea on exertion, cough, sputum, wheezing.  GI: See history of present illness. GU:  Negative for dysuria, hematuria, urinary incontinence, urinary frequency, nocturnal urination.  MS: Negative for joint pain, low back pain.  Derm: Negative for rash or itching.  Neuro: Negative for weakness, abnormal sensation, seizure, frequent headaches, memory loss,  confusion.  Psych: Negative for anxiety, depression, suicidal ideation, hallucinations.  Endo: Negative for unusual weight change.  Heme: Negative for bruising or bleeding. Allergy: Negative for rash or hives.   Observations/Objective: Lab Results  Component Value Date   TSH 0.92 07/02/2019    Pleasant, well-nourished, well-developed female in no acute distress.  Skin color looks normal.  No jaundice.  No evidence of shortness of breath.  Ambulating through her house without any difficulty.  Otherwise exam is not available.  Assessment and Plan: Pleasant 58 year old female with history of chronic GERD, diarrhea, blood in the stool.  Seen back in July and was scheduled for colonoscopy for diarrhea and rectal bleeding but this was postponed due to patient's schedule.  She is scheduled next month now.  Recently had increased hematochezia which alarmed her.  Happening intermittently but filling up the toilet tissue.  Has felt a little bit more weak and lightheaded.  Do not have a hemoglobin on file, will check one in the near future.  Plan for colonoscopy as scheduled next month.  Continue to monitor symptoms and let me know if worsening bleeding.  As far as loose stools, she gives a history of IBS, used to be predominantly constipated.  She is having some fecal urgency associated with fecal incontinence.  Trial of dicyclomine 10 mg up to 4 times daily as needed.  Cautioned regarding constipation.  Also advised to hold the week prior to her colonoscopy as to not hinder bowel prep process.  If symptoms do not improve she will let me know.  Not mentioned above she has had some left lower quadrant pain for couple of days.  Notices it more with movement.  We will continue to monitor.  If persisting despite dicyclomine, she will let me know.  If she develops fever or worsening pain she should let me know as well.  Reflux for the most part is well controlled on omeprazole 20 mg twice  daily.  No alarm  symptoms at this time.  Follow Up Instructions:    I discussed the assessment and treatment plan with the patient. The patient was provided an opportunity to ask questions and all were answered. The patient agreed with the plan and demonstrated an understanding of the instructions. AVS mailed to patient's home address.   The patient was advised to call back or seek an in-person evaluation if the symptoms worsen or if the condition fails to improve as anticipated.  I provided 15 minutes of virtual face-to-face time during this encounter.   Neil Crouch, PA-C

## 2019-07-31 NOTE — H&P (View-Only) (Signed)
Primary Care Physician:  Celene Squibb, MD Primary GI:  Garfield Cornea, MD   Patient Location: Home  Provider Location: [place] office  Reason for Visit: diarrhea, abd pain, blood in stool  Persons present on the virtual encounter, with roles: Patient, myself (provider),Mindy Estudillo, CMA (updated meds and allergies)  Total time (minutes) spent on medical discussion: 15 minutes   Due to COVID-19, visit was conducted using [email or web address] method.  Visit was requested by patient.  Virtual Visit via [email or web address]  I connected with Kennedy Bucker on 07/31/19 at  9:30 AM EST by [email or web address] and verified that I am speaking with the correct person using two identifiers.   I discussed the limitations, risks, security and privacy concerns of performing an evaluation and management service by telephone/video and the availability of in person appointments. I also discussed with the patient that there may be a patient responsible charge related to this service. The patient expressed understanding and agreed to proceed.  Chief Complaint  Patient presents with  . heme +    f/u. seen more blood in stool recently. Can have diarrhea for 3-4 days then will have constipation. x 1 month having accidents on self with diarrhea  . Gastroesophageal Reflux    f/u. Reports water can cause heartburn    HPI:   Stacey Brooks is a 58 y.o. female who presents for virtual visit for follow-up.  She was seen back in July for heme positive stools and GERD.  She was also having diarrhea for 2 months.  Currently scheduled for colonoscopy on December 7.  She had been scheduled in September but she needed to reschedule for personal reasons.  Patient called and made this appointment because she has been seeing a little bit more blood than usual when she has a bowel movement.  It started several weeks after her last visit here.  She is having fresh blood per rectum, mostly on the toilet tissue.  A little more lightheadedness than  usual.  Denies any melena.  She continues to have diarrhea which she has had for several months now.  Generally only 1 or 2 stools per day but has a lot of urgency which is led to some accidents.  No nocturnal diarrhea.  She does have some solid stools intermittently.  She may been go a couple of days without a bowel movement at all.  Denies any hard stools.  In the past she was predominantly constipated but being switched over the past several months.    For the most part her reflux is better on omeprazole twice daily.  She states about once a week she may have refractory symptoms but this typically occurs if she eats something wrong or she forgets to take her omeprazole.  No vomiting.  No dysphagia.     Current Outpatient Medications  Medication Sig Dispense Refill  . albuterol (PROVENTIL HFA;VENTOLIN HFA) 108 (90 Base) MCG/ACT inhaler Inhale 2 puffs into the lungs every 6 (six) hours as needed for wheezing or shortness of breath.    Marland Kitchen amLODipine (NORVASC) 5 MG tablet Take 5 mg by mouth daily.     Marland Kitchen escitalopram (LEXAPRO) 5 MG tablet Take 5 mg by mouth daily.    . hydrALAZINE (APRESOLINE) 25 MG tablet Take 25 mg by mouth 2 (two) times daily.    . hydrochlorothiazide (HYDRODIURIL) 25 MG tablet Take 25 mg by mouth daily.    Marland Kitchen lisinopril (ZESTRIL) 40 MG tablet Take 40 mg by  mouth daily.     Marland Kitchen lovastatin (MEVACOR) 20 MG tablet Take 20 mg by mouth at bedtime.     Marland Kitchen omeprazole (PRILOSEC) 20 MG capsule Take 1 capsule (20 mg total) by mouth 2 (two) times daily before a meal. (Patient taking differently: Take 20 mg by mouth daily. ) 60 capsule 3  . oxybutynin (DITROPAN-XL) 10 MG 24 hr tablet Take 10 mg by mouth daily.     No current facility-administered medications for this visit.     Past Medical History:  Diagnosis Date  . ANA positive   . Anxiety   . Asthma    Intermittent; asthmatic bronchitis  . Back pain 06/25/2011  . Carpal tunnel syndrome on right 10/11/2008  . Cervical nerve root  impingement 10/03/2012  . Cervical spondylolysis 10/03/2012  . Colon polyps 08/25/2012  . Constipation   . COPD (chronic obstructive pulmonary disease) ([place]) 06/25/2011  . De Quervain's disease (tenosynovitis) 07/02/2013  . Depression 06/25/2011  . Diverticulosis 08/25/2012  . Edema 07/03/2003  . Excessive hair 08/22/2012   body and facial   . Fatty liver 06/10/2015  . Fibromyalgia 06/30/2009  . GERD (gastroesophageal reflux disease) 07/03/2003  . H/O bladder repair surgery 02/23/2013  . H/O neck surgery 10/03/2012  . Hemorrhoids 07/01/2014  . Hirsutism   . Hypercholesterolemia 07/05/2003   Mild  . Hyperlipidemia   . IBS (irritable bowel syndrome) 2010  . Incontinence of urine in female 09/09/2003  . Insomnia 2010  . Lumbar degenerative disc disease   . Migraine 10/13/2011  . Neural foraminal stenosis of cervical spine   . Obesity 08/23/2013  . Obstructive chronic bronchitis ([place])   . Osteoarthritis 06/25/2011  . Pilar cyst 07/30/2011  . Polymyalgia ([place]) 03/31/2009  . SS-A antibody positive 08/22/2012  . Suicide attempt ([place]) 06/25/2011  . Thyroid disease    Hypothyroidism  . Tobacco abuse 07/03/2003  . Trigger middle finger 05/30/2008    Past Surgical History:  Procedure Laterality Date  . ABDOMINAL HYSTERECTOMY  04/04/2013  . BACK SURGERY  2007  . CARPECTOMY WITH RADIAL STYLOIDECTOMY  01/05/2012   DeQuervain's Release   . CERVICAL FUSION  2007  . WRIST ARTHROSCOPY  01/05/2012   release of carpal ligament    Family History  Problem Relation Age of Onset  . Emphysema Mother   . Hypertension Father   . Cancer Sister        Ovarian  . Cancer Maternal Grandmother   . Cancer Maternal Grandfather   . Colon cancer Neg Hx     Social History   Socioeconomic History  . Marital status: Single    Spouse name: Not on file  . Number of children: Not on file  . Years of education: Not on file  . Highest education level: Not on file  Occupational History  . Not on file  Social  Needs  . Financial resource strain: Not on file  . Food insecurity    Worry: Not on file    Inability: Not on file  . Transportation needs    Medical: Not on file    Non-medical: Not on file  Tobacco Use  . Smoking status: Current Every Day Smoker    Packs/day: 0.50    Years: 35.00    Pack years: 17.50  . Smokeless tobacco: Never Used  Substance and Sexual Activity  . Alcohol use: No    Alcohol/week: 0.0 standard drinks  . Drug use: Not Currently    Types: Cocaine  . Sexual activity:  Not on file  Lifestyle  . Physical activity    Days per week: Not on file    Minutes per session: Not on file  . Stress: Not on file  Relationships  . Social Herbalist on phone: Not on file    Gets together: Not on file    Attends religious service: Not on file    Active member of club or organization: Not on file    Attends meetings of clubs or organizations: Not on file    Relationship status: Not on file  . Intimate partner violence    Fear of current or ex partner: Not on file    Emotionally abused: Not on file    Physically abused: Not on file    Forced sexual activity: Not on file  Other Topics Concern  . Not on file  Social History Narrative   Widowed   Unemployed      ROS:  General: Negative for anorexia, weight loss, fever, chills, fatigue, weakness.  Some lightheadedness. Eyes: Negative for vision changes.  ENT: Negative for hoarseness, difficulty swallowing , nasal congestion. CV: Negative for chest pain, angina, palpitations, dyspnea on exertion, peripheral edema.  Respiratory: Negative for dyspnea at rest, dyspnea on exertion, cough, sputum, wheezing.  GI: See history of present illness. GU:  Negative for dysuria, hematuria, urinary incontinence, urinary frequency, nocturnal urination.  MS: Negative for joint pain, low back pain.  Derm: Negative for rash or itching.  Neuro: Negative for weakness, abnormal sensation, seizure, frequent headaches, memory loss,  confusion.  Psych: Negative for anxiety, depression, suicidal ideation, hallucinations.  Endo: Negative for unusual weight change.  Heme: Negative for bruising or bleeding. Allergy: Negative for rash or hives.   Observations/Objective: Lab Results  Component Value Date   TSH 0.92 07/02/2019    Pleasant, well-nourished, well-developed female in no acute distress.  Skin color looks normal.  No jaundice.  No evidence of shortness of breath.  Ambulating through her house without any difficulty.  Otherwise exam is not available.  Assessment and Plan: Pleasant 58 year old female with history of chronic GERD, diarrhea, blood in the stool.  Seen back in July and was scheduled for colonoscopy for diarrhea and rectal bleeding but this was postponed due to patient's schedule.  She is scheduled next month now.  Recently had increased hematochezia which alarmed her.  Happening intermittently but filling up the toilet tissue.  Has felt a little bit more weak and lightheaded.  Do not have a hemoglobin on file, will check one in the near future.  Plan for colonoscopy as scheduled next month.  Continue to monitor symptoms and let me know if worsening bleeding.  As far as loose stools, she gives a history of IBS, used to be predominantly constipated.  She is having some fecal urgency associated with fecal incontinence.  Trial of dicyclomine 10 mg up to 4 times daily as needed.  Cautioned regarding constipation.  Also advised to hold the week prior to her colonoscopy as to not hinder bowel prep process.  If symptoms do not improve she will let me know.  Not mentioned above she has had some left lower quadrant pain for couple of days.  Notices it more with movement.  We will continue to monitor.  If persisting despite dicyclomine, she will let me know.  If she develops fever or worsening pain she should let me know as well.  Reflux for the most part is well controlled on omeprazole 20 mg twice  daily.  No alarm  symptoms at this time.  Follow Up Instructions:    I discussed the assessment and treatment plan with the patient. The patient was provided an opportunity to ask questions and all were answered. The patient agreed with the plan and demonstrated an understanding of the instructions. AVS mailed to patient's home address.   The patient was advised to call back or seek an in-person evaluation if the symptoms worsen or if the condition fails to improve as anticipated.  I provided 15 minutes of virtual face-to-face time during this encounter.   Neil Crouch, PA-C

## 2019-07-31 NOTE — Patient Instructions (Signed)
1. Trial of dicylcomine 10mg  30 minutes before breakfast three times a day and at bedtime for diarrhea. HOLD for constipation. HOLD FOR ONE WEEK BEFORE YOUR UPCOMING COLONOSCOPY. 2. Call if you have persistent abdominal pain, diarrhea does not improve, develop fever etc. IT IS VERY IMPORTANT THAT YOU CALL IF ONGOING ABDOMINAL PAIN AS THIS COULD INTERFERE WITH SAFETY OF COLONOSCOPY. 3. Go for labs as soon as you can. The orders are at [facility].  4. Continue omeprazole 20mg  twice a day for reflux. If you need over the counter medication for reflux you can you Rolaids or TUMS as needed on occasion.

## 2019-08-22 NOTE — Patient Instructions (Signed)
Stacey Brooks  08/22/2019     @PREFPERIOPPHARMACY @   Your procedure is scheduled on  08/27/2019 .  Report to [facility] at  0930  A.M.  Call this number if you have problems the morning of surgery:  [phone]   Remember:  Follow the diet and prep instructions given to you by dr Roseanne Kaufman office.                   Take these medicines the morning of surgery with A SIP OF WATER  Amlodipine, hydralazine, prilosec, ditropan.    Do not wear jewelry, make-up or nail polish.  Do not wear lotions, powders, or perfumes. Please wear deodorant and brush your teeth.  Do not shave 48 hours prior to surgery.  Men may shave face and neck.  Do not bring valuables to the hospital.  [facility] is not responsible for any belongings or valuables.  Contacts, dentures or bridgework may not be worn into surgery.  Leave your suitcase in the car.  After surgery it may be brought to your room.  For patients admitted to the hospital, discharge time will be determined by your treatment team.  Patients discharged the day of surgery will not be allowed to drive home.   Name and phone number of your driver:   Family Special instructions:  None  Please read over the following fact sheets that you were given. Anesthesia Post-op Instructions and Care and Recovery After Surgery       Colonoscopy, Adult, Care After This sheet gives you information about how to care for yourself after your procedure. Your health care provider may also give you more specific instructions. If you have problems or questions, contact your health care provider. What can I expect after the procedure? After the procedure, it is common to have:  A small amount of blood in your stool for 24 hours after the procedure.  Some gas.  Mild abdominal cramping or bloating. Follow these instructions at home: General instructions  For the first 24 hours after the procedure: ? Do not drive or use machinery. ? Do not sign  important documents. ? Do not drink alcohol. ? Do your regular daily activities at a slower pace than normal. ? Eat soft, easy-to-digest foods.  Take over-the-counter or prescription medicines only as told by your health care provider. Relieving cramping and bloating   Try walking around when you have cramps or feel bloated.  Apply heat to your abdomen as told by your health care provider. Use a heat source that your health care provider recommends, such as a moist heat pack or a heating pad. ? Place a towel between your skin and the heat source. ? Leave the heat on for 20-30 minutes. ? Remove the heat if your skin turns bright red. This is especially important if you are unable to feel pain, heat, or cold. You may have a greater risk of getting burned. Eating and drinking   Drink enough fluid to keep your urine pale yellow.  Resume your normal diet as instructed by your health care provider. Avoid heavy or fried foods that are hard to digest.  Avoid drinking alcohol for as long as instructed by your health care provider. Contact a health care provider if:  You have blood in your stool 2-3 days after the procedure. Get help right away if:  You have more than a small spotting of blood in your stool.  You pass large  blood clots in your stool.  Your abdomen is swollen.  You have nausea or vomiting.  You have a fever.  You have increasing abdominal pain that is not relieved with medicine. Summary  After the procedure, it is common to have a small amount of blood in your stool. You may also have mild abdominal cramping and bloating.  For the first 24 hours after the procedure, do not drive or use machinery, sign important documents, or drink alcohol.  Contact your health care provider if you have a lot of blood in your stool, nausea or vomiting, a fever, or increased abdominal pain. This information is not intended to replace advice given to you by your health care provider.  Make sure you discuss any questions you have with your health care provider. Document Released: 04/20/2004 Document Revised: 06/29/2017 Document Reviewed: 11/18/2015 Elsevier Patient Education  2020 [employer] After These instructions provide you with information about caring for yourself after your procedure. Your health care provider may also give you more specific instructions. Your treatment has been planned according to current medical practices, but problems sometimes occur. Call your health care provider if you have any problems or questions after your procedure. What can I expect after the procedure? After your procedure, you may:  Feel sleepy for several hours.  Feel clumsy and have poor balance for several hours.  Feel forgetful about what happened after the procedure.  Have poor judgment for several hours.  Feel nauseous or vomit.  Have a sore throat if you had a breathing tube during the procedure. Follow these instructions at home: For at least 24 hours after the procedure:      Have a responsible adult stay with you. It is important to have someone help care for you until you are awake and alert.  Rest as needed.  Do not: ? Participate in activities in which you could fall or become injured. ? Drive. ? Use heavy machinery. ? Drink alcohol. ? Take sleeping pills or medicines that cause drowsiness. ? Make important decisions or sign legal documents. ? Take care of children on your own. Eating and drinking  Follow the diet that is recommended by your health care provider.  If you vomit, drink water, juice, or soup when you can drink without vomiting.  Make sure you have little or no nausea before eating solid foods. General instructions  Take over-the-counter and prescription medicines only as told by your health care provider.  If you have sleep apnea, surgery and certain medicines can increase your risk for breathing  problems. Follow instructions from your health care provider about wearing your sleep device: ? Anytime you are sleeping, including during daytime naps. ? While taking prescription pain medicines, sleeping medicines, or medicines that make you drowsy.  If you smoke, do not smoke without supervision.  Keep all follow-up visits as told by your health care provider. This is important. Contact a health care provider if:  You keep feeling nauseous or you keep vomiting.  You feel light-headed.  You develop a rash.  You have a fever. Get help right away if:  You have trouble breathing. Summary  For several hours after your procedure, you may feel sleepy and have poor judgment.  Have a responsible adult stay with you for at least 24 hours or until you are awake and alert. This information is not intended to replace advice given to you by your health care provider. Make sure you discuss any questions  you have with your health care provider. Document Released: 12/28/2015 Document Revised: 12/05/2017 Document Reviewed: 12/28/2015 Elsevier Patient Education  2020 [employer].

## 2019-08-24 ENCOUNTER — Encounter (HOSPITAL_COMMUNITY)
Admission: RE | Admit: 2019-08-24 | Discharge: 2019-08-24 | Disposition: A | Discharge status: Home / Self Care | Payer: Medicare Other | Source: Ambulatory Visit | Attending: Internal Medicine | Admitting: Internal Medicine

## 2019-08-24 ENCOUNTER — Other Ambulatory Visit: Payer: Self-pay

## 2019-08-24 ENCOUNTER — Other Ambulatory Visit (HOSPITAL_COMMUNITY)
Admission: RE | Admit: 2019-08-24 | Discharge: 2019-08-24 | Disposition: A | Discharge status: Home / Self Care | Payer: Medicare Other | Source: Ambulatory Visit | Attending: Internal Medicine | Admitting: Internal Medicine

## 2019-08-24 ENCOUNTER — Encounter (HOSPITAL_COMMUNITY): Payer: Self-pay

## 2019-08-24 DIAGNOSIS — Z20828 Contact with and (suspected) exposure to other viral communicable diseases: Secondary | ICD-10-CM | POA: Diagnosis not present

## 2019-08-24 DIAGNOSIS — Z01812 Encounter for preprocedural laboratory examination: Secondary | ICD-10-CM | POA: Diagnosis not present

## 2019-08-24 DIAGNOSIS — R195 Other fecal abnormalities: Secondary | ICD-10-CM | POA: Insufficient documentation

## 2019-08-24 HISTORY — DX: Essential (primary) hypertension: I10

## 2019-08-24 HISTORY — DX: Other chronic pain: G89.29

## 2019-08-24 HISTORY — DX: Malignant (primary) neoplasm, unspecified: C80.1

## 2019-08-24 LAB — CBC WITH DIFFERENTIAL/PLATELET
Abs Immature Granulocytes: 0.03 10*3/uL (ref 0.00–0.07)
Basophils Absolute: 0.1 10*3/uL (ref 0.0–0.1)
Basophils Relative: 1 %
Eosinophils Absolute: 0.2 10*3/uL (ref 0.0–0.5)
Eosinophils Relative: 2 %
HCT: 39.7 % (ref 36.0–46.0)
Hemoglobin: 12.9 g/dL (ref 12.0–15.0)
Immature Granulocytes: 0 %
Lymphocytes Relative: 16 %
Lymphs Abs: 1.6 10*3/uL (ref 0.7–4.0)
MCH: 28.5 pg (ref 26.0–34.0)
MCHC: 32.5 g/dL (ref 30.0–36.0)
MCV: 87.8 fL (ref 80.0–100.0)
Monocytes Absolute: 0.9 10*3/uL (ref 0.1–1.0)
Monocytes Relative: 9 %
Neutro Abs: 7.1 10*3/uL (ref 1.7–7.7)
Neutrophils Relative %: 72 %
Platelets: 264 10*3/uL (ref 150–400)
RBC: 4.52 MIL/uL (ref 3.87–5.11)
RDW: 13.5 % (ref 11.5–15.5)
WBC: 9.7 10*3/uL (ref 4.0–10.5)
nRBC: 0 % (ref 0.0–0.2)

## 2019-08-24 LAB — BASIC METABOLIC PANEL
Anion gap: 10 (ref 5–15)
BUN: 13 mg/dL (ref 6–20)
CO2: 22 mmol/L (ref 22–32)
Calcium: 8.7 mg/dL — ABNORMAL LOW (ref 8.9–10.3)
Chloride: 105 mmol/L (ref 98–111)
Creatinine, Ser: 0.94 mg/dL (ref 0.44–1.00)
GFR calc Af Amer: >60 mL/min (ref >60)
GFR calc non Af Amer: >60 mL/min (ref >60)
Glucose, Bld: 113 mg/dL — ABNORMAL HIGH (ref 70–99)
Potassium: 2.7 mmol/L — CL (ref 3.5–5.1)
Sodium: 137 mmol/L (ref 135–145)

## 2019-08-24 LAB — SARS CORONAVIRUS 2 (TAT 6-24 HRS): SARS Coronavirus 2: NEGATIVE
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
## 2019-08-26 ENCOUNTER — Ambulatory Visit (HOSPITAL_COMMUNITY): Payer: Medicare Other | Admitting: Anesthesiology

## 2019-08-27 ENCOUNTER — Ambulatory Visit (HOSPITAL_COMMUNITY)
Admission: RE | Admit: 2019-08-27 | Discharge: 2019-08-27 | Disposition: A | Discharge status: Home / Self Care | Payer: Medicare Other | Attending: Internal Medicine | Admitting: Internal Medicine

## 2019-08-27 ENCOUNTER — Encounter (HOSPITAL_COMMUNITY)
Admission: RE | Disposition: A | Discharge status: Home / Self Care | Payer: Self-pay | Source: Home / Self Care | Attending: Internal Medicine

## 2019-08-27 ENCOUNTER — Encounter (HOSPITAL_COMMUNITY): Payer: Self-pay | Admitting: Anesthesiology

## 2019-08-27 ENCOUNTER — Other Ambulatory Visit: Payer: Self-pay

## 2019-08-27 DIAGNOSIS — F17210 Nicotine dependence, cigarettes, uncomplicated: Secondary | ICD-10-CM | POA: Diagnosis not present

## 2019-08-27 DIAGNOSIS — M797 Fibromyalgia: Secondary | ICD-10-CM | POA: Insufficient documentation

## 2019-08-27 DIAGNOSIS — E782 Mixed hyperlipidemia: Secondary | ICD-10-CM | POA: Diagnosis not present

## 2019-08-27 DIAGNOSIS — F419 Anxiety disorder, unspecified: Secondary | ICD-10-CM | POA: Diagnosis not present

## 2019-08-27 DIAGNOSIS — M1990 Unspecified osteoarthritis, unspecified site: Secondary | ICD-10-CM | POA: Diagnosis not present

## 2019-08-27 DIAGNOSIS — Z79899 Other long term (current) drug therapy: Secondary | ICD-10-CM | POA: Insufficient documentation

## 2019-08-27 DIAGNOSIS — I10 Essential (primary) hypertension: Secondary | ICD-10-CM | POA: Diagnosis not present

## 2019-08-27 DIAGNOSIS — F329 Major depressive disorder, single episode, unspecified: Secondary | ICD-10-CM | POA: Insufficient documentation

## 2019-08-27 DIAGNOSIS — K760 Fatty (change of) liver, not elsewhere classified: Secondary | ICD-10-CM | POA: Diagnosis not present

## 2019-08-27 DIAGNOSIS — K921 Melena: Secondary | ICD-10-CM | POA: Diagnosis not present

## 2019-08-27 DIAGNOSIS — J449 Chronic obstructive pulmonary disease, unspecified: Secondary | ICD-10-CM | POA: Diagnosis not present

## 2019-08-27 DIAGNOSIS — Z538 Procedure and treatment not carried out for other reasons: Secondary | ICD-10-CM | POA: Insufficient documentation

## 2019-08-27 DIAGNOSIS — K580 Irritable bowel syndrome with diarrhea: Secondary | ICD-10-CM | POA: Insufficient documentation

## 2019-08-27 DIAGNOSIS — K219 Gastro-esophageal reflux disease without esophagitis: Secondary | ICD-10-CM | POA: Insufficient documentation

## 2019-08-27 LAB — BASIC METABOLIC PANEL
Anion gap: 13 (ref 5–15)
BUN: 6 mg/dL (ref 6–20)
CO2: 28 mmol/L (ref 22–32)
Calcium: 8.9 mg/dL (ref 8.9–10.3)
Chloride: 97 mmol/L — ABNORMAL LOW (ref 98–111)
Creatinine, Ser: 0.92 mg/dL (ref 0.44–1.00)
GFR calc Af Amer: >60 mL/min (ref >60)
GFR calc non Af Amer: >60 mL/min (ref >60)
Glucose, Bld: 135 mg/dL — ABNORMAL HIGH (ref 70–99)
Potassium: 2.7 mmol/L — CL (ref 3.5–5.1)
Sodium: 138 mmol/L (ref 135–145)

## 2019-08-27 LAB — MAGNESIUM: Magnesium: 2.2 mg/dL (ref 1.7–2.4)

## 2019-08-27 SURGERY — COLONOSCOPY WITH PROPOFOL
Anesthesia: Monitor Anesthesia Care

## 2019-08-27 MED ORDER — KETAMINE HCL 50 MG/5ML IJ SOSY
PREFILLED_SYRINGE | INTRAMUSCULAR | Status: AC
  Filled 2019-08-27: qty 5

## 2019-08-27 MED ORDER — LACTATED RINGERS IV SOLN
Freq: Once | INTRAVENOUS | Status: AC
  Administered 2019-08-27: 11:00:00 via INTRAVENOUS

## 2019-08-27 MED ORDER — LIDOCAINE 2% (20 MG/ML) 5 ML SYRINGE
INTRAMUSCULAR | Status: AC
  Filled 2019-08-27: qty 5

## 2019-08-27 MED ORDER — PROPOFOL 10 MG/ML IV BOLUS
INTRAVENOUS | Status: AC
  Filled 2019-08-27: qty 40

## 2019-08-27 MED ORDER — PROPOFOL 10 MG/ML IV BOLUS
INTRAVENOUS | Status: AC
  Filled 2019-08-27: qty 20

## 2019-08-27 MED ORDER — GLYCOPYRROLATE PF 0.2 MG/ML IJ SOSY
PREFILLED_SYRINGE | INTRAMUSCULAR | Status: AC
  Filled 2019-08-27: qty 1

## 2019-08-27 MED ORDER — POTASSIUM CHLORIDE 10 MEQ/100ML IV SOLN
10.0000 meq | INTRAVENOUS | Status: AC
  Administered 2019-08-27 (×3): 10 meq via INTRAVENOUS
  Filled 2019-08-27: qty 100

## 2019-08-27 NOTE — Discharge Instructions (Signed)
We are canceling your colonoscopy today because your potassium is too low.  We will recheck your potassium.  Please do not take any more hydrochlorothiazide until after your colonoscopy.   We will plan to get you scheduled for a colonoscopy within the next week.  Please do not use any unauthorized medications between now and the time of your colonoscopy  Further recommendations to follow for my from my office.

## 2019-08-27 NOTE — Progress Notes (Addendum)
Stacey Brooks is cancelled for today. She will contact Dr Nevada Crane regarding potassium RX. Stop taking HCTZ for now per Dr Gala Romney.   Office will contact patient to reschedule colonoscopy.  Patient verbalized understanding

## 2019-08-27 NOTE — Interval H&P Note (Signed)
History and Physical Interval Note:  08/27/2019 12:18 PM  Stacey Brooks  has presented today for surgery, with the diagnosis of Heme+ stool.  The various methods of treatment have been discussed with the patient and family. After consideration of risks, benefits and other options for treatment, the patient has consented to  Procedure(s) with comments: COLONOSCOPY WITH PROPOFOL (N/A) - 2:15pm as a surgical intervention.  The patient's history has been reviewed, patient examined, no change in status, stable for surgery.  I have reviewed the patient's chart and labs.  Questions were answered to the patient's satisfaction.     Robert Rourk  Potassium remains low at 2.7.  Discussed with anesthesia.  Would be best to hold off on CT today until potassium can be repleted.  Gave the patient the option of coming back for prep remaining on clear liquids later in the week.  She wishes just to cancel and regroup.  She understands she will have to reprep.  He is agreeable.  She will not take any further hydrochlorothiazide till instructed.

## 2019-08-27 NOTE — Anesthesia Preprocedure Evaluation (Signed)
Anesthesia Evaluation  Patient identified by MRN, date of birth, ID band  Reviewed: Allergy & Precautions, NPO status , Patient's Chart, lab work & pertinent test results  Airway        Dental   Pulmonary asthma , COPD, Current Smoker,           Cardiovascular hypertension,      Neuro/Psych  Headaches, PSYCHIATRIC DISORDERS Anxiety Depression  Neuromuscular disease (cervical spondylosis)    GI/Hepatic GERD  Medicated,  Endo/Other    Renal/GU      Musculoskeletal  (+) Arthritis , Fibromyalgia -Back pain   Abdominal   Peds  Hematology   Anesthesia Other Findings   Reproductive/Obstetrics                             Anesthesia Physical Anesthesia Plan  ASA:   Anesthesia Plan: General   Post-op Pain Management:    Induction:   PONV Risk Score and Plan:   Airway Management Planned:   Additional Equipment:   Intra-op Plan:   Post-operative Plan:   Informed Consent:   Plan Discussed with:   Anesthesia Plan Comments:         Anesthesia Quick Evaluation

## 2019-08-28 ENCOUNTER — Other Ambulatory Visit: Payer: Self-pay | Admitting: *Deleted

## 2019-08-28 ENCOUNTER — Telehealth: Payer: Self-pay

## 2019-08-28 ENCOUNTER — Other Ambulatory Visit: Payer: Self-pay

## 2019-08-28 DIAGNOSIS — F331 Major depressive disorder, recurrent, moderate: Secondary | ICD-10-CM

## 2019-08-28 DIAGNOSIS — R195 Other fecal abnormalities: Secondary | ICD-10-CM

## 2019-08-28 MED ORDER — CLENPIQ 10-3.5-12 MG-GM -GM/160ML PO SOLN: 1.0000 | Freq: Once | ORAL | 0 refills | Status: AC
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
## 2019-08-28 NOTE — Telephone Encounter (Signed)
-----   Message from Daneil Dolin, MD sent at 08/27/2019 12:24 PM EST ----- Could not get potassium up in time for colonoscopy today.  Its canceled.  Lets see if we can get her back on the schedule for this coming Thursday or next Monday.  She will need propofol.

## 2019-08-28 NOTE — Telephone Encounter (Signed)
Per CM, TCS w/Propofol can be scheduled for 09/12/19 at 7:30am.  Called pt, TCS scheduled for 09/12/19 at 7:30am. Rx for prep sent to pharmacy. Orders entered.

## 2019-08-29 NOTE — Telephone Encounter (Signed)
Pre-op and COVID test 09/10/19. Called and informed pt. Appt letter mailed with procedure instructions.

## 2019-09-04 ENCOUNTER — Encounter: Payer: Self-pay | Admitting: *Deleted

## 2019-09-04 ENCOUNTER — Other Ambulatory Visit: Payer: Self-pay | Admitting: *Deleted

## 2019-09-04 NOTE — Patient Outreach (Signed)
[facility]) Care Management  09/04/2019  Stacey Brooks 27-May-1961 [id number]   CSW made an initial attempt to try and contact patient today to perform the initial phone assessment, as well as assess and assist with social work needs and services, without success.  A HIPAA compliant message was left for patient on voicemail.  CSW is currently awaiting a return call.  CSW will make a second outreach attempt within the next 3-4 business days, if a return call is not received from patient in the meantime.  CSW will also mail an Outreach Letter to patient's home requesting that patient contact CSW if patient is interested in receiving social work services through [place] with Scientist, clinical (histocompatibility and immunogenetics).  [name], BSW, MSW, LCSW  Licensed Education officer, [facility]  Mailing [address] [address], [place], [place] [address] Physical Address-[address], [place], [place] [address] Toll Free Main # [phone] Fax # [phone] Cell # [phone]  Office # [phone] [name]

## 2019-09-07 ENCOUNTER — Other Ambulatory Visit: Payer: Self-pay | Admitting: *Deleted

## 2019-09-07 NOTE — Patient Instructions (Signed)
Stacey Brooks  09/07/2019     @PREFPERIOPPHARMACY @   Your procedure is scheduled on  09/12/2019 .  Report to Stacey Brooks at  [phone]  A.M.  Call this number if you have problems the morning of surgery:  [phone]   Remember:  Follow the diet and prep instructions given to you by Dr Roseanne Kaufman office.                    Take these medicines the morning of surgery with A SIP OF WATER  Amlodipine, lexapro, prilosec, ditropan. Use your inhaler before you come.    Do not wear jewelry, make-up or nail polish.  Do not wear lotions, powders, or perfumes. Please wear deodorant and brush your teeth.  Do not shave 48 hours prior to surgery.  Men may shave face and neck.  Do not bring valuables to the hospital.  [facility] is not responsible for any belongings or valuables.  Contacts, dentures or bridgework may not be worn into surgery.  Leave your suitcase in the car.  After surgery it may be brought to your room.  For patients admitted to the hospital, discharge time will be determined by your treatment team.  Patients discharged the day of surgery will not be allowed to drive home.   Name and phone number of your driver:   family Special instructions:  None  Please read over the following fact sheets that you were given. Anesthesia Post-op Instructions and Care and Recovery After Surgery       Colonoscopy, Adult, Care After This sheet gives you information about how to care for yourself after your procedure. Your health care provider may also give you more specific instructions. If you have problems or questions, contact your health care provider. What can I expect after the procedure? After the procedure, it is common to have:  A small amount of blood in your stool for 24 hours after the procedure.  Some gas.  Mild abdominal cramping or bloating. Follow these instructions at home: General instructions  For the first 24 hours after the procedure: ? Do not drive or  use machinery. ? Do not sign important documents. ? Do not drink alcohol. ? Do your regular daily activities at a slower pace than normal. ? Eat soft, easy-to-digest foods.  Take over-the-counter or prescription medicines only as told by your health care provider. Relieving cramping and bloating   Try walking around when you have cramps or feel bloated.  Apply heat to your abdomen as told by your health care provider. Use a heat source that your health care provider recommends, such as a moist heat pack or a heating pad. ? Place a towel between your skin and the heat source. ? Leave the heat on for 20-30 minutes. ? Remove the heat if your skin turns bright red. This is especially important if you are unable to feel pain, heat, or cold. You may have a greater risk of getting burned. Eating and drinking   Drink enough fluid to keep your urine pale yellow.  Resume your normal diet as instructed by your health care provider. Avoid heavy or fried foods that are hard to digest.  Avoid drinking alcohol for as long as instructed by your health care provider. Contact a health care provider if:  You have blood in your stool 2-3 days after the procedure. Get help right away if:  You have more than a small spotting of blood  in your stool.  You pass large blood clots in your stool.  Your abdomen is swollen.  You have nausea or vomiting.  You have a fever.  You have increasing abdominal pain that is not relieved with medicine. Summary  After the procedure, it is common to have a small amount of blood in your stool. You may also have mild abdominal cramping and bloating.  For the first 24 hours after the procedure, do not drive or use machinery, sign important documents, or drink alcohol.  Contact your health care provider if you have a lot of blood in your stool, nausea or vomiting, a fever, or increased abdominal pain. This information is not intended to replace advice given to you  by your health care provider. Make sure you discuss any questions you have with your health care provider. Document Released: 04/20/2004 Document Revised: 06/29/2017 Document Reviewed: 11/18/2015 Elsevier Patient Education  2020 Stacey Brooks After These instructions provide you with information about caring for yourself after your procedure. Your health care provider may also give you more specific instructions. Your treatment has been planned according to current medical practices, but problems sometimes occur. Call your health care provider if you have any problems or questions after your procedure. What can I expect after the procedure? After your procedure, you may:  Feel sleepy for several hours.  Feel clumsy and have poor balance for several hours.  Feel forgetful about what happened after the procedure.  Have poor judgment for several hours.  Feel nauseous or vomit.  Have a sore throat if you had a breathing tube during the procedure. Follow these instructions at home: For at least 24 hours after the procedure:      Have a responsible adult stay with you. It is important to have someone help care for you until you are awake and alert.  Rest as needed.  Do not: ? Participate in activities in which you could fall or become injured. ? Drive. ? Use heavy machinery. ? Drink alcohol. ? Take sleeping pills or medicines that cause drowsiness. ? Make important decisions or sign legal documents. ? Take care of children on your own. Eating and drinking  Follow the diet that is recommended by your health care provider.  If you vomit, drink water, juice, or soup when you can drink without vomiting.  Make sure you have little or no nausea before eating solid foods. General instructions  Take over-the-counter and prescription medicines only as told by your health care provider.  If you have sleep apnea, surgery and certain medicines can  increase your risk for breathing problems. Follow instructions from your health care provider about wearing your sleep device: ? Anytime you are sleeping, including during daytime naps. ? While taking prescription pain medicines, sleeping medicines, or medicines that make you drowsy.  If you smoke, do not smoke without supervision.  Keep all follow-up visits as told by your health care provider. This is important. Contact a health care provider if:  You keep feeling nauseous or you keep vomiting.  You feel light-headed.  You develop a rash.  You have a fever. Get help right away if:  You have trouble breathing. Summary  For several hours after your procedure, you may feel sleepy and have poor judgment.  Have a responsible adult stay with you for at least 24 hours or until you are awake and alert. This information is not intended to replace advice given to you by your health care  provider. Make sure you discuss any questions you have with your health care provider. Document Released: 12/28/2015 Document Revised: 12/05/2017 Document Reviewed: 12/28/2015 Elsevier Patient Education  2020 [employer].

## 2019-09-07 NOTE — Patient Outreach (Signed)
[name] Care Management  09/07/2019  Stacey Brooks [birth year] [id number]   CSW made a second attempt to try and contact patient today to perform phone assessment, as well as assess and assist with social work needs and services, without success.  A HIPAA compliant message was left for patient on voicemail.  CSW continues to await a return call.  CSW will make a third and final outreach attempt within the next 3-4 business days, if a return call is not received from patient in the meantime.  CSW will then proceed with case closure if a return call is not received from patient with a total of 10 business days, as required number of phone attempts will have been made and outreach letter mailed.   [name], BSW, MSW, LCSW  Licensed Education officer, [facility]  Mailing [place] [address] [place], [place] [address] Physical Address-[address], [place], [place] [address] Toll Free Main # [phone] Fax # [phone] Cell # [phone]  Office # [phone] [name]

## 2019-09-10 ENCOUNTER — Other Ambulatory Visit: Payer: Self-pay

## 2019-09-10 ENCOUNTER — Other Ambulatory Visit: Payer: Self-pay | Admitting: Gastroenterology

## 2019-09-10 ENCOUNTER — Other Ambulatory Visit (HOSPITAL_COMMUNITY)
Admission: RE | Admit: 2019-09-10 | Discharge: 2019-09-10 | Disposition: A | Discharge status: Home / Self Care | Payer: Medicare Other | Source: Ambulatory Visit | Attending: Internal Medicine | Admitting: Internal Medicine

## 2019-09-10 ENCOUNTER — Encounter (HOSPITAL_COMMUNITY)
Admission: RE | Admit: 2019-09-10 | Discharge: 2019-09-10 | Disposition: A | Discharge status: Home / Self Care | Payer: Medicare Other | Source: Ambulatory Visit | Attending: Internal Medicine | Admitting: Internal Medicine

## 2019-09-10 ENCOUNTER — Encounter (HOSPITAL_COMMUNITY): Payer: Self-pay

## 2019-09-10 DIAGNOSIS — Z01812 Encounter for preprocedural laboratory examination: Secondary | ICD-10-CM | POA: Insufficient documentation

## 2019-09-10 DIAGNOSIS — Z20828 Contact with and (suspected) exposure to other viral communicable diseases: Secondary | ICD-10-CM | POA: Insufficient documentation

## 2019-09-10 LAB — BASIC METABOLIC PANEL
Anion gap: 10 (ref 5–15)
BUN: 12 mg/dL (ref 6–20)
CO2: 23 mmol/L (ref 22–32)
Calcium: 8.8 mg/dL — ABNORMAL LOW (ref 8.9–10.3)
Chloride: 106 mmol/L (ref 98–111)
Creatinine, Ser: 0.91 mg/dL (ref 0.44–1.00)
GFR calc Af Amer: >60 mL/min (ref >60)
GFR calc non Af Amer: >60 mL/min (ref >60)
Glucose, Bld: 134 mg/dL — ABNORMAL HIGH (ref 70–99)
Potassium: 3.2 mmol/L — ABNORMAL LOW (ref 3.5–5.1)
Sodium: 139 mmol/L (ref 135–145)

## 2019-09-10 LAB — SARS CORONAVIRUS 2 (TAT 6-24 HRS): SARS Coronavirus 2: NEGATIVE

## 2019-09-10 MED ORDER — POTASSIUM CHLORIDE ER 10 MEQ PO TBCR: EXTENDED_RELEASE_TABLET | ORAL | 0 refills | Status: DC

## 2019-09-10 NOTE — Progress Notes (Signed)
Patient made aware to pick up Potassium pills

## 2019-09-11 NOTE — Anesthesia Preprocedure Evaluation (Addendum)
Anesthesia Evaluation  Patient identified by MRN, date of birth, ID band Patient awake    Reviewed: Allergy & Precautions, NPO status , Patient's Chart, lab work & pertinent test results  Airway Mallampati: II  TM Distance: >3 FB Neck ROM: Full    Dental no notable dental hx. (+) Teeth Intact   Pulmonary asthma , COPD,  COPD inhaler, Current SmokerPatient did not abstain from smoking.,    Pulmonary exam normal breath sounds clear to auscultation       Cardiovascular Exercise Tolerance: Good hypertension, Pt. on medications negative cardio ROS Normal cardiovascular examI Rhythm:Regular Rate:Normal     Neuro/Psych  Headaches, Anxiety Depression  Neuromuscular disease negative psych ROS   GI/Hepatic Neg liver ROS, GERD  Medicated and Controlled,  Endo/Other  negative endocrine ROS  Renal/GU negative Renal ROS  negative genitourinary   Musculoskeletal  (+) Arthritis , Osteoarthritis,  Fibromyalgia -  Abdominal   Peds negative pediatric ROS (+)  Hematology negative hematology ROS (+)   Anesthesia Other Findings   Reproductive/Obstetrics negative OB ROS                            Anesthesia Physical Anesthesia Plan  ASA: III  Anesthesia Plan: General   Post-op Pain Management:    Induction: Intravenous  PONV Risk Score and Plan: 2 and Propofol infusion, TIVA and Treatment may vary due to age or medical condition  Airway Management Planned: Nasal Cannula and Simple Face Mask  Additional Equipment:   Intra-op Plan:   Post-operative Plan:   Informed Consent: I have reviewed the patients History and Physical, chart, labs and discussed the procedure including the risks, benefits and alternatives for the proposed anesthesia with the patient or authorized representative who has indicated his/her understanding and acceptance.     Dental advisory given  Plan Discussed with:  CRNA  Anesthesia Plan Comments: (Plan Full PPE use  Plan GA with GETA as needed d/w pt -WTP with same after Q&A)        Anesthesia Quick Evaluation

## 2019-09-12 ENCOUNTER — Ambulatory Visit (HOSPITAL_COMMUNITY)
Admission: RE | Admit: 2019-09-12 | Discharge: 2019-09-12 | Disposition: A | Discharge status: Home / Self Care | Payer: Medicare Other | Attending: Internal Medicine | Admitting: Internal Medicine

## 2019-09-12 ENCOUNTER — Encounter (HOSPITAL_COMMUNITY)
Admission: RE | Disposition: A | Discharge status: Home / Self Care | Payer: Self-pay | Source: Home / Self Care | Attending: Internal Medicine

## 2019-09-12 ENCOUNTER — Ambulatory Visit (HOSPITAL_COMMUNITY): Payer: Medicare Other | Admitting: Anesthesiology

## 2019-09-12 ENCOUNTER — Encounter (HOSPITAL_COMMUNITY): Payer: Self-pay | Admitting: Internal Medicine

## 2019-09-12 DIAGNOSIS — F329 Major depressive disorder, single episode, unspecified: Secondary | ICD-10-CM | POA: Insufficient documentation

## 2019-09-12 DIAGNOSIS — K5730 Diverticulosis of large intestine without perforation or abscess without bleeding: Secondary | ICD-10-CM | POA: Insufficient documentation

## 2019-09-12 DIAGNOSIS — K921 Melena: Secondary | ICD-10-CM | POA: Diagnosis not present

## 2019-09-12 DIAGNOSIS — K640 First degree hemorrhoids: Secondary | ICD-10-CM | POA: Insufficient documentation

## 2019-09-12 DIAGNOSIS — J449 Chronic obstructive pulmonary disease, unspecified: Secondary | ICD-10-CM | POA: Insufficient documentation

## 2019-09-12 DIAGNOSIS — E785 Hyperlipidemia, unspecified: Secondary | ICD-10-CM | POA: Insufficient documentation

## 2019-09-12 DIAGNOSIS — K635 Polyp of colon: Secondary | ICD-10-CM | POA: Diagnosis not present

## 2019-09-12 DIAGNOSIS — I10 Essential (primary) hypertension: Secondary | ICD-10-CM | POA: Insufficient documentation

## 2019-09-12 DIAGNOSIS — Z79899 Other long term (current) drug therapy: Secondary | ICD-10-CM | POA: Insufficient documentation

## 2019-09-12 DIAGNOSIS — E669 Obesity, unspecified: Secondary | ICD-10-CM | POA: Diagnosis not present

## 2019-09-12 DIAGNOSIS — F17200 Nicotine dependence, unspecified, uncomplicated: Secondary | ICD-10-CM | POA: Insufficient documentation

## 2019-09-12 DIAGNOSIS — D124 Benign neoplasm of descending colon: Secondary | ICD-10-CM | POA: Diagnosis not present

## 2019-09-12 DIAGNOSIS — M353 Polymyalgia rheumatica: Secondary | ICD-10-CM | POA: Insufficient documentation

## 2019-09-12 DIAGNOSIS — K589 Irritable bowel syndrome without diarrhea: Secondary | ICD-10-CM | POA: Diagnosis not present

## 2019-09-12 DIAGNOSIS — E7800 Pure hypercholesterolemia, unspecified: Secondary | ICD-10-CM | POA: Insufficient documentation

## 2019-09-12 DIAGNOSIS — M797 Fibromyalgia: Secondary | ICD-10-CM | POA: Diagnosis not present

## 2019-09-12 DIAGNOSIS — Z86010 Personal history of colonic polyps: Secondary | ICD-10-CM | POA: Insufficient documentation

## 2019-09-12 DIAGNOSIS — F419 Anxiety disorder, unspecified: Secondary | ICD-10-CM | POA: Insufficient documentation

## 2019-09-12 DIAGNOSIS — M1990 Unspecified osteoarthritis, unspecified site: Secondary | ICD-10-CM | POA: Diagnosis not present

## 2019-09-12 DIAGNOSIS — Z85828 Personal history of other malignant neoplasm of skin: Secondary | ICD-10-CM | POA: Insufficient documentation

## 2019-09-12 DIAGNOSIS — D120 Benign neoplasm of cecum: Secondary | ICD-10-CM | POA: Insufficient documentation

## 2019-09-12 DIAGNOSIS — Z8249 Family history of ischemic heart disease and other diseases of the circulatory system: Secondary | ICD-10-CM | POA: Insufficient documentation

## 2019-09-12 DIAGNOSIS — E039 Hypothyroidism, unspecified: Secondary | ICD-10-CM | POA: Diagnosis not present

## 2019-09-12 DIAGNOSIS — K760 Fatty (change of) liver, not elsewhere classified: Secondary | ICD-10-CM | POA: Diagnosis not present

## 2019-09-12 DIAGNOSIS — K219 Gastro-esophageal reflux disease without esophagitis: Secondary | ICD-10-CM | POA: Diagnosis not present

## 2019-09-12 DIAGNOSIS — G709 Myoneural disorder, unspecified: Secondary | ICD-10-CM | POA: Diagnosis not present

## 2019-09-12 DIAGNOSIS — D123 Benign neoplasm of transverse colon: Secondary | ICD-10-CM | POA: Diagnosis not present

## 2019-09-12 DIAGNOSIS — Z981 Arthrodesis status: Secondary | ICD-10-CM | POA: Insufficient documentation

## 2019-09-12 DIAGNOSIS — Z881 Allergy status to other antibiotic agents status: Secondary | ICD-10-CM | POA: Insufficient documentation

## 2019-09-12 DIAGNOSIS — R195 Other fecal abnormalities: Secondary | ICD-10-CM

## 2019-09-12 DIAGNOSIS — Z90710 Acquired absence of both cervix and uterus: Secondary | ICD-10-CM | POA: Insufficient documentation

## 2019-09-12 HISTORY — PX: POLYPECTOMY: SHX5525

## 2019-09-12 HISTORY — PX: COLONOSCOPY WITH PROPOFOL: SHX5780

## 2019-09-12 SURGERY — COLONOSCOPY WITH PROPOFOL
Anesthesia: General

## 2019-09-12 MED ORDER — PROMETHAZINE HCL 25 MG/ML IJ SOLN: 6.2500 mg | INTRAMUSCULAR | Status: DC | PRN

## 2019-09-12 MED ORDER — CHLORHEXIDINE GLUCONATE CLOTH 2 % EX PADS: 6.0000 | MEDICATED_PAD | Freq: Once | CUTANEOUS | Status: DC

## 2019-09-12 MED ORDER — MIDAZOLAM HCL 2 MG/2ML IJ SOLN
INTRAMUSCULAR | Status: AC
  Filled 2019-09-12: qty 2

## 2019-09-12 MED ORDER — HYDROMORPHONE HCL 1 MG/ML IJ SOLN: 0.2500 mg | INTRAMUSCULAR | Status: DC | PRN

## 2019-09-12 MED ORDER — STERILE WATER FOR IRRIGATION IR SOLN
Status: DC | PRN
  Administered 2019-09-12: 08:00:00 2.5 mL

## 2019-09-12 MED ORDER — PROPOFOL 10 MG/ML IV BOLUS
INTRAVENOUS | Status: AC
  Filled 2019-09-12: qty 40

## 2019-09-12 MED ORDER — PROPOFOL 500 MG/50ML IV EMUL
INTRAVENOUS | Status: DC | PRN
  Administered 2019-09-12: 150 ug/kg/min via INTRAVENOUS

## 2019-09-12 MED ORDER — PROPOFOL 10 MG/ML IV BOLUS
INTRAVENOUS | Status: DC | PRN
  Administered 2019-09-12: 20 mg via INTRAVENOUS
  Administered 2019-09-12 (×3): 10 mg via INTRAVENOUS
  Administered 2019-09-12: 20 mg via INTRAVENOUS
  Administered 2019-09-12: 40 mg via INTRAVENOUS
  Administered 2019-09-12: 20 mg via INTRAVENOUS

## 2019-09-12 MED ORDER — EPHEDRINE SULFATE 50 MG/ML IJ SOLN
INTRAMUSCULAR | Status: DC | PRN
  Administered 2019-09-12 (×2): 10 mg via INTRAVENOUS

## 2019-09-12 MED ORDER — MIDAZOLAM HCL 5 MG/5ML IJ SOLN
INTRAMUSCULAR | Status: DC | PRN
  Administered 2019-09-12: 2 mg via INTRAVENOUS

## 2019-09-12 MED ORDER — HYDROCODONE-ACETAMINOPHEN 7.5-325 MG PO TABS: 1.0000 | ORAL_TABLET | Freq: Once | ORAL | Status: DC | PRN

## 2019-09-12 MED ORDER — LACTATED RINGERS IV SOLN
INTRAVENOUS | Status: DC
  Administered 2019-09-12: 1000 mL via INTRAVENOUS

## 2019-09-12 MED ORDER — MIDAZOLAM HCL 2 MG/2ML IJ SOLN: 0.5000 mg | Freq: Once | INTRAMUSCULAR | Status: DC | PRN

## 2019-09-12 NOTE — H&P (Signed)
@LOGO @   Primary Care Physician:  Celene Squibb, MD Primary Gastroenterologist:  Dr. Gala Romney  Pre-Procedure History & Physical: HPI:  Stacey Brooks is a 58 y.o. female here for further evaluation of intermittent rectal bleeding/Hemoccult positive stool.  Last colonoscopy reported only 10 years ago in [place].  Specifics unknown to me at this time. Past Medical History:  Diagnosis Date  . ANA positive   . Anxiety   . Asthma    Intermittent; asthmatic bronchitis  . Back pain 06/25/2011  . Cancer ([place])    skin cancer L arm  . Carpal tunnel syndrome on right 10/11/2008  . Cervical nerve root impingement 10/03/2012  . Cervical spondylolysis 10/03/2012  . Chronic back pain   . Colon polyps 08/25/2012  . Constipation   . COPD (chronic obstructive pulmonary disease) ([place]) 06/25/2011  . De Quervain's disease (tenosynovitis) 07/02/2013  . Depression 06/25/2011  . Diverticulosis 08/25/2012  . Edema 07/03/2003  . Excessive hair 08/22/2012   body and facial   . Fatty liver 06/10/2015  . Fibromyalgia 06/30/2009  . GERD (gastroesophageal reflux disease) 07/03/2003  . H/O bladder repair surgery 02/23/2013  . H/O neck surgery 10/03/2012  . Hemorrhoids 07/01/2014  . Hirsutism   . Hypercholesterolemia 07/05/2003   Mild  . Hyperlipidemia   . Hypertension   . IBS (irritable bowel syndrome) 2010  . Incontinence of urine in female 09/09/2003  . Insomnia 2010  . Lumbar degenerative disc disease   . Migraine 08/17/2019  . Neural foraminal stenosis of cervical spine   . Obesity 08/23/2013  . Obstructive chronic bronchitis ([place])   . Osteoarthritis 06/25/2011  . Pilar cyst 07/30/2011  . Polymyalgia ([place]) 03/31/2009  . SS-A antibody positive 08/22/2012  . Suicide attempt ([place]) 06/25/2011  . Thyroid disease    Hypothyroidism  . Tobacco abuse 07/03/2003  . Trigger middle finger 05/30/2008    Past Surgical History:  Procedure Laterality Date  . ABDOMINAL HYSTERECTOMY  04/04/2013  . BACK SURGERY  2007   . CARPECTOMY WITH RADIAL STYLOIDECTOMY  01/05/2012   DeQuervain's Release   . CERVICAL FUSION  2007  . WRIST ARTHROSCOPY  01/05/2012   release of carpal ligament    Prior to Admission medications   Medication Sig Start Date End Date Taking? Authorizing Provider  albuterol (PROVENTIL HFA;VENTOLIN HFA) 108 (90 Base) MCG/ACT inhaler Inhale 2 puffs into the lungs every 6 (six) hours as needed for wheezing or shortness of breath.   Yes [provider]  amLODipine (NORVASC) 5 MG tablet Take 5 mg by mouth daily.  03/03/19  Yes [provider]  cholecalciferol (VITAMIN D3) 25 MCG (1000 UT) tablet Take 1,000 Units by mouth daily.   Yes [provider]  dicyclomine (BENTYL) 10 MG capsule Take 1 capsule (10 mg total) by mouth 4 (four) times daily -  before meals and at bedtime. Hold for constipation 07/31/19  Yes Mahala Menghini, PA-C  escitalopram (LEXAPRO) 5 MG tablet Take 5 mg by mouth daily.   Yes [provider]  hydrALAZINE (APRESOLINE) 25 MG tablet Take 25 mg by mouth 2 (two) times daily.   Yes [provider]  hydrochlorothiazide (HYDRODIURIL) 25 MG tablet Take 25 mg by mouth daily. 08/27/19  Yes [provider]  lisinopril (ZESTRIL) 40 MG tablet Take 40 mg by mouth daily.  07/15/17  Yes [provider]  lovastatin (MEVACOR) 20 MG tablet Take 20 mg by mouth at bedtime.  06/03/17  Yes [provider]  omeprazole ([place])  20 MG capsule Take 1 capsule (20 mg total) by mouth 2 (two) times daily before a meal. Patient taking differently: Take 20 mg by mouth daily.  04/11/19  Yes Carlis Stable, NP  oxybutynin (DITROPAN-XL) 10 MG 24 hr tablet Take 10 mg by mouth daily.   Yes [provider]  potassium chloride (KLOR-CON) 10 MEQ tablet Take 4 tablets Monday (34mEq). Take 4 tablets in the morning (46mEq) and 4 tablets in evening (28mEq) on Tuesday. 09/10/19  Yes Annitta Needs, NP    Allergies as of 08/28/2019 - Review Complete  08/27/2019  Allergen Reaction Noted  . Clindamycin/lincomycin Rash 10/27/2015  . Erythromycin Itching and Rash 10/27/2015    Family History  Problem Relation Age of Onset  . Emphysema Mother   . Hypertension Father   . Cancer Sister        Ovarian  . Cancer Maternal Grandmother   . Cancer Maternal Grandfather   . Colon cancer Neg Hx     Social History   Socioeconomic History  . Marital status: Divorced    Spouse name: Not on file  . Number of children: Not on file  . Years of education: Not on file  . Highest education level: Not on file  Occupational History  . Not on file  Tobacco Use  . Smoking status: Current Every Day Smoker    Packs/day: 0.50    Years: 35.00    Pack years: 17.50  . Smokeless tobacco: Never Used  . Tobacco comment: has used cocaine in the past, over 10 years  Substance and Sexual Activity  . Alcohol use: No    Alcohol/week: 0.0 standard drinks  . Drug use: Not Currently    Comment: has used cocaine  . Sexual activity: Not on file  Other Topics Concern  . Not on file  Social History Narrative   Widowed   Unemployed   Social Determinants of Health   Financial Resource Strain: High Risk  . Difficulty of Paying Living Expenses: Hard  Food Insecurity: Food Insecurity Present  . Worried About Charity fundraiser in the Last Year: Often true  . Ran Out of Food in the Last Year: Sometimes true  Transportation Needs: No Transportation Needs  . Lack of Transportation (Medical): No  . Lack of Transportation (Non-Medical): No  Physical Activity:   . Days of Exercise per Week: Not on file  . Minutes of Exercise per Session: Not on file  Stress: Stress Concern Present  . Feeling of Stress : Very much  Social Connections:   . Frequency of Communication with Friends and Family: Not on file  . Frequency of Social Gatherings with Friends and Family: Not on file  . Attends Religious Services: Not on file  . Active Member of Clubs or Organizations:  Not on file  . Attends Archivist Meetings: Not on file  . Marital Status: Not on file  Intimate Partner Violence:   . Fear of Current or Ex-Partner: Not on file  . Emotionally Abused: Not on file  . Physically Abused: Not on file  . Sexually Abused: Not on file    Review of Systems: See HPI, otherwise negative ROS  Physical Exam: BP 112/81   Pulse 82   Temp 98.4 F (36.9 C) (Oral)   Resp 18   SpO2 99%  General:   Alert,  Well-developed, well-nourished, pleasant and cooperative in NAD Skin:  Intact without significant lesions or rashes. ignificant cervical adenopathy. Lungs:  Clear  throughout to auscultation.   No wheezes, crackles, or rhonchi. No acute distress. Heart:  Regular rate and rhythm; no murmurs, clicks, rubs,  or gallops. Abdomen: Non-distended, normal bowel sounds.  Soft and nontender without appreciable mass or hepatosplenomegaly.  Pulses:  Normal pulses noted. Extremities:  Without clubbing or edema.  Impression/Plan: 58 year old lady with Hemoccult positive stool/hematochezia.  Here for diagnostic colonoscopy per plan. The risks, benefits, limitations, alternatives and imponderables have been reviewed with the patient. Questions have been answered. All parties are agreeable.      Notice: This dictation was prepared with Dragon dictation along with smaller phrase technology. Any transcriptional errors that result from this process are unintentional and may not be corrected upon review.

## 2019-09-12 NOTE — Transfer of Care (Signed)
Immediate Anesthesia Transfer of Care Note  Patient: Stacey Brooks  Procedure(s) Performed: COLONOSCOPY WITH PROPOFOL (N/A ) POLYPECTOMY  Patient Location: PACU  Anesthesia Type:General  Level of Consciousness: awake  Airway & Oxygen Therapy: Patient Spontanous Breathing  Post-op Assessment: Report given to RN  Post vital signs: Reviewed  Last Vitals:  Vitals Value Taken Time  BP    Temp    Pulse 87 09/12/19 0809  Resp 18 09/12/19 0809  SpO2 96 % 09/12/19 0809  Vitals shown include unvalidated device data.  Last Pain:  Vitals:   09/12/19 0703  TempSrc: Oral  PainSc: 0-No pain      Patients Stated Pain Goal: 8 ([id number])  Complications: No apparent anesthesia complications

## 2019-09-12 NOTE — Discharge Instructions (Signed)
Colonoscopy Discharge Instructions  Read the instructions outlined below and refer to this sheet in the next few weeks. These discharge instructions provide you with general information on caring for yourself after you leave the hospital. Your doctor may also give you specific instructions. While your treatment has been planned according to the most current medical practices available, unavoidable complications occasionally occur. If you have any problems or questions after discharge, call Dr. Gala Romney at [phone]. ACTIVITY  You may resume your regular activity, but move at a slower pace for the next 24 hours.   Take frequent rest periods for the next 24 hours.   Walking will help get rid of the air and reduce the bloated feeling in your belly (abdomen).   No driving for 24 hours (because of the medicine (anesthesia) used during the test).    Do not sign any important legal documents or operate any machinery for 24 hours (because of the anesthesia used during the test).  NUTRITION  Drink plenty of fluids.   You may resume your normal diet as instructed by your doctor.   Begin with a light meal and progress to your normal diet. Heavy or fried foods are harder to digest and may make you feel sick to your stomach (nauseated).   Avoid alcoholic beverages for 24 hours or as instructed.  MEDICATIONS  You may resume your normal medications unless your doctor tells you otherwise.  WHAT YOU CAN EXPECT TODAY  Some feelings of bloating in the abdomen.   Passage of more gas than usual.   Spotting of blood in your stool or on the toilet paper.  IF YOU HAD POLYPS REMOVED DURING THE COLONOSCOPY:  No aspirin products for 7 days or as instructed.   No alcohol for 7 days or as instructed.   Eat a soft diet for the next 24 hours.  FINDING OUT THE RESULTS OF YOUR TEST Not all test results are available during your visit. If your test results are not back during the visit, make an appointment  with your caregiver to find out the results. Do not assume everything is normal if you have not heard from your caregiver or the medical facility. It is important for you to follow up on all of your test results.  SEEK IMMEDIATE MEDICAL ATTENTION IF:  You have more than a spotting of blood in your stool.   Your belly is swollen (abdominal distention).   You are nauseated or vomiting.   You have a temperature over 101.   You have abdominal pain or discomfort that is severe or gets worse throughout the day.    Colon Polyps  Polyps are tissue growths inside the body. Polyps can grow in many places, including the large intestine (colon). A polyp may be a round bump or a mushroom-shaped growth. You could have one polyp or several. Most colon polyps are noncancerous (benign). However, some colon polyps can become cancerous over time. Finding and removing the polyps early can help prevent this. What are the causes? The exact cause of colon polyps is not known. What increases the risk? You are more likely to develop this condition if you:  Have a family history of colon cancer or colon polyps.  Are older than 94 or older than 45 if you are African American.  Have inflammatory bowel disease, such as ulcerative colitis or Crohn's disease.  Have certain hereditary conditions, such as: ? Familial adenomatous polyposis. ? Lynch syndrome. ? Turcot syndrome. ? Peutz-Jeghers syndrome.  Are  overweight.  Smoke cigarettes.  Do not get enough exercise.  Drink too much alcohol.  Eat a diet that is high in fat and red meat and low in fiber.  Had childhood cancer that was treated with abdominal radiation. What are the signs or symptoms? Most polyps do not cause symptoms. If you have symptoms, they may include:  Blood coming from your rectum when having a bowel movement.  Blood in your stool. The stool may look dark red or black.  Abdominal pain.  A change in bowel habits, such as  constipation or diarrhea. How is this diagnosed? This condition is diagnosed with a colonoscopy. This is a procedure in which a lighted, flexible scope is inserted into the anus and then passed into the colon to examine the area. Polyps are sometimes found when a colonoscopy is done as part of routine cancer screening tests. How is this treated? Treatment for this condition involves removing any polyps that are found. Most polyps can be removed during a colonoscopy. Those polyps will then be tested for cancer. Additional treatment may be needed depending on the results of testing. Follow these instructions at home: Lifestyle  Maintain a healthy weight, or lose weight if recommended by your health care provider.  Exercise every day or as told by your health care provider.  Do not use any products that contain nicotine or tobacco, such as cigarettes and e-cigarettes. If you need help quitting, ask your health care provider.  If you drink alcohol, limit how much you have: ? 0-1 drink a day for women. ? 0-2 drinks a day for men.  Be aware of how much alcohol is in your drink. In the [place] one drink equals one 12 oz bottle of beer (355 mL), one 5 oz glass of wine (148 mL), or one 1 oz shot of hard liquor (44 mL). Eating and drinking   Eat foods that are high in fiber, such as fruits, vegetables, and whole grains.  Eat foods that are high in calcium and vitamin D, such as milk, cheese, yogurt, eggs, liver, fish, and broccoli.  Limit foods that are high in fat, such as fried foods and desserts.  Limit the amount of red meat and processed meat you eat, such as hot dogs, sausage, bacon, and lunch meats. General instructions  Keep all follow-up visits as told by your health care provider. This is important. ? This includes having regularly scheduled colonoscopies. ? Talk to your health care provider about when you need a colonoscopy. Contact a health care provider if:  You have new or  worsening bleeding during a bowel movement.  You have new or increased blood in your stool.  You have a change in bowel habits.  You lose weight for no known reason. Summary  Polyps are tissue growths inside the body. Polyps can grow in many places, including the colon.  Most colon polyps are noncancerous (benign), but some can become cancerous over time.  This condition is diagnosed with a colonoscopy.  Treatment for this condition involves removing any polyps that are found. Most polyps can be removed during a colonoscopy. This information is not intended to replace advice given to you by your health care provider. Make sure you discuss any questions you have with your health care provider. Document Released: 06/02/2004 Document Revised: 12/22/2017 Document Reviewed: 12/22/2017 Elsevier Patient Education  2020 [employer].  Hemorrhoids Hemorrhoids are swollen veins that may develop:  In the butt (rectum). These are called internal hemorrhoids.  Around the opening of the butt (anus). These are called external hemorrhoids. Hemorrhoids can cause pain, itching, or bleeding. Most of the time, they do not cause serious problems. They usually get better with diet changes, lifestyle changes, and other home treatments. What are the causes? This condition may be caused by:  Having trouble pooping (constipation).  Pushing hard (straining) to poop.  Watery poop (diarrhea).  Pregnancy.  Being very overweight (obese).  Sitting for long periods of time.  Heavy lifting or other activity that causes you to strain.  Anal sex.  Riding a bike for a long period of time. What are the signs or symptoms? Symptoms of this condition include:  Pain.  Itching or soreness in the butt.  Bleeding from the butt.  Leaking poop.  Swelling in the area.  One or more lumps around the opening of your butt. How is this diagnosed? A doctor can often diagnose this condition by looking at the  affected area. The doctor may also:  Do an exam that involves feeling the area with a gloved hand (digital rectal exam).  Examine the area inside your butt using a small tube (anoscope).  Order blood tests. This may be done if you have lost a lot of blood.  Have you get a test that involves looking inside the colon using a flexible tube with a camera on the end (sigmoidoscopy or colonoscopy). How is this treated? This condition can usually be treated at home. Your doctor may tell you to change what you eat, make lifestyle changes, or try home treatments. If these do not help, procedures can be done to remove the hemorrhoids or make them smaller. These may involve:  Placing rubber bands at the base of the hemorrhoids to cut off their blood supply.  Injecting medicine into the hemorrhoids to shrink them.  Shining a type of light energy onto the hemorrhoids to cause them to fall off.  Doing surgery to remove the hemorrhoids or cut off their blood supply. Follow these instructions at home: Eating and drinking   Eat foods that have a lot of fiber in them. These include whole grains, beans, nuts, fruits, and vegetables.  Ask your doctor about taking products that have added fiber (fibersupplements).  Reduce the amount of fat in your diet. You can do this by: ? Eating low-fat dairy products. ? Eating less red meat. ? Avoiding processed foods.  Drink enough fluid to keep your pee (urine) pale yellow. Managing pain and swelling   Take a warm-water bath (sitz bath) for 20 minutes to ease pain. Do this 3-4 times a day. You may do this in a bathtub or using a portable sitz bath that fits over the toilet.  If told, put ice on the painful area. It may be helpful to use ice between your warm baths. ? Put ice in a plastic bag. ? Place a towel between your skin and the bag. ? Leave the ice on for 20 minutes, 2-3 times a day. General instructions  Take over-the-counter and prescription  medicines only as told by your doctor. ? Medicated creams and medicines may be used as told.  Exercise often. Ask your doctor how much and what kind of exercise is best for you.  Go to the bathroom when you have the urge to poop. Do not wait.  Avoid pushing too hard when you poop.  Keep your butt dry and clean. Use wet toilet paper or moist towelettes after pooping.  Do not sit on  the toilet for a long time.  Keep all follow-up visits as told by your doctor. This is important. Contact a doctor if you:  Have pain and swelling that do not get better with treatment or medicine.  Have trouble pooping.  Cannot poop.  Have pain or swelling outside the area of the hemorrhoids. Get help right away if you have:  Bleeding that will not stop. Summary  Hemorrhoids are swollen veins in the butt or around the opening of the butt.  They can cause pain, itching, or bleeding.  Eat foods that have a lot of fiber in them. These include whole grains, beans, nuts, fruits, and vegetables.  Take a warm-water bath (sitz bath) for 20 minutes to ease pain. Do this 3-4 times a day. This information is not intended to replace advice given to you by your health care provider. Make sure you discuss any questions you have with your health care provider. Document Released: 06/15/2008 Document Revised: 09/14/2018 Document Reviewed: 01/26/2018 Elsevier Patient Education  2020 [employer].   Diverticulosis  Diverticulosis is a condition that develops when small pouches (diverticula) form in the wall of the large intestine (colon). The colon is where water is absorbed and stool is formed. The pouches form when the inside layer of the colon pushes through weak spots in the outer layers of the colon. You may have a few pouches or many of them. What are the causes? The cause of this condition is not known. What increases the risk? The following factors may make you more likely to develop this  condition:  Being older than age 8. Your risk for this condition increases with age. Diverticulosis is rare among people younger than age 62. By age 81, many people have it.  Eating a low-fiber diet.  Having frequent constipation.  Being overweight.  Not getting enough exercise.  Smoking.  Taking over-the-counter pain medicines, like aspirin and ibuprofen.  Having a family history of diverticulosis. What are the signs or symptoms? In most people, there are no symptoms of this condition. If you do have symptoms, they may include:  Bloating.  Cramps in the abdomen.  Constipation or diarrhea.  Pain in the lower left side of the abdomen. How is this diagnosed? This condition is most often diagnosed during an exam for other colon problems. Because diverticulosis usually has no symptoms, it often cannot be diagnosed independently. This condition may be diagnosed by:  Using a flexible scope to examine the colon (colonoscopy).  Taking an X-ray of the colon after dye has been put into the colon (barium enema).  Doing a CT scan. How is this treated? You may not need treatment for this condition if you have never developed an infection related to diverticulosis. If you have had an infection before, treatment may include:  Eating a high-fiber diet. This may include eating more fruits, vegetables, and grains.  Taking a fiber supplement.  Taking a live bacteria supplement (probiotic).  Taking medicine to relax your colon.  Taking antibiotic medicines. Follow these instructions at home:  Drink 6-8 glasses of water or more each day to prevent constipation.  Try not to strain when you have a bowel movement.  If you have had an infection before: ? Eat more fiber as directed by your health care provider or your diet and nutrition specialist (dietitian). ? Take a fiber supplement or probiotic, if your health care provider approves.  Take over-the-counter and prescription  medicines only as told by your health care provider.  If you were prescribed an antibiotic, take it as told by your health care provider. Do not stop taking the antibiotic even if you start to feel better.  Keep all follow-up visits as told by your health care provider. This is important. Contact a health care provider if:  You have pain in your abdomen.  You have bloating.  You have cramps.  You have not had a bowel movement in 3 days. Get help right away if:  Your pain gets worse.  Your bloating becomes very bad.  You have a fever or chills, and your symptoms suddenly get worse.  You vomit.  You have bowel movements that are bloody or black.  You have bleeding from your rectum. Summary  Diverticulosis is a condition that develops when small pouches (diverticula) form in the wall of the large intestine (colon).  You may have a few pouches or many of them.  This condition is most often diagnosed during an exam for other colon problems.  If you have had an infection related to diverticulosis, treatment may include increasing the fiber in your diet, taking supplements, or taking medicines. This information is not intended to replace advice given to you by your health care provider. Make sure you discuss any questions you have with your health care provider. Document Released: 06/03/2004 Document Revised: 08/19/2017 Document Reviewed: 07/26/2016 Elsevier Patient Education  [place].     Colon polyp, hemorrhoid and diverticulosis information provided.  I remove multiple polyps from your colon.  You may have more but I could not see because of residual stool.  Give Benefiber 1 tablespoon daily x3 weeks then increase to twice daily thereafter  Begin Linzess 145 1 capsule daily for constipation  Your colon prep was relatively poor today.  I will likely be recommending an early interval follow-up examination  Office follow-up with [place] in 6 weeks- Feb. 5th @  9:30am

## 2019-09-12 NOTE — Op Note (Signed)
[facility] Patient Name: Stacey Brooks Procedure Date: 09/12/2019 7:11 AM MRN: [id number] Date of Birth: 09/11/1961 Attending MD: Norvel Richards , MD CSN: [id number] Age: 58 Admit Type: Outpatient Procedure:                Colonoscopy Indications:              Hematochezia, Heme positive stool Providers:                Norvel Richards, MD, Lurline Del, RN, Aram Candela Referring MD:              Medicines:                Propofol per Anesthesia Complications:            No immediate complications. Estimated Blood Loss:     Estimated blood loss was minimal. Procedure:                Pre-Anesthesia Assessment:                           - Prior to the procedure, a History and Physical                            was performed, and patient medications and                            allergies were reviewed. The patient's tolerance of                            previous anesthesia was also reviewed. The risks                            and benefits of the procedure and the sedation                            options and risks were discussed with the patient.                            All questions were answered, and informed consent                            was obtained. Prior Anticoagulants: The patient has                            taken no previous anticoagulant or antiplatelet                            agents. ASA Grade Assessment: II - A patient with                            mild systemic disease. After reviewing the risks  and benefits, the patient was deemed in                            satisfactory condition to undergo the procedure.                           After obtaining informed consent, the colonoscope                            was passed under direct vision. Throughout the                            procedure, the patient's blood pressure, pulse, and                            oxygen saturations  were monitored continuously. The                            [id number]-[id number] scope was introduced through                            the anus and advanced to the the cecum, identified                            by appendiceal orifice and ileocecal valve. The                            colonoscopy was performed without difficulty. The                            patient tolerated the procedure well. The quality                            of the bowel preparation was inadequate. Scope In: 7:41:05 AM Scope Out: 8:01:53 AM Scope Withdrawal Time: 0 hours 13 minutes 24 seconds  Total Procedure Duration: 0 hours 20 minutes 48 seconds  Findings:      The perianal and digital rectal examinations were normal.      Non-bleeding internal hemorrhoids were found during retroflexion. The       hemorrhoids were moderate, medium-sized and Grade I (internal       hemorrhoids that do not prolapse).      Scattered small and large-mouthed diverticula were found in the entire       colon.      Seven semi-pedunculated polyps were found in the descending colon,       hepatic flexure and cecum. The polyps were 4 to 8 mm in size. These       polyps were removed with a cold snare. Resection and retrieval were       complete. Estimated blood loss was minimal.      The exam was otherwise without abnormality on direct and retroflexion       views. Impression:               - Preparation of the colon was inadequate.                           -  Non-bleeding internal hemorrhoids.                           - Diverticulosis in the entire examined colon.                           - Seven 4 to 8 mm polyps in the descending colon,                            at the hepatic flexure and in the cecum, removed                            with a cold snare. Resected and retrieved.                           - The examination was otherwise normal on direct                            and retroflexion views. Preparation of the  colon                            was inadequate significant segments not seen well. Moderate Sedation:      Moderate (conscious) sedation was personally administered by an       anesthesia professional. The following parameters were monitored: oxygen       saturation, heart rate, blood pressure, respiratory rate, EKG, adequacy       of pulmonary ventilation, and response to care. Recommendation:           - Patient has a contact number available for                            emergencies. The signs and symptoms of potential                            delayed complications were discussed with the                            patient. Return to normal activities tomorrow.                            Written discharge instructions were provided to the                            patient.                           - Resume previous diet.                           - Continue present medications.                           - Repeat colonoscopy at appointment to be scheduled  because the bowel preparation was poor.                           - Return to GI office in 6 weeks. Follow-up                            pathology. Procedure Code(s):        --- Professional ---                           [phone], Colonoscopy, flexible; with removal of                            tumor(s), polyp(s), or other lesion(s) by snare                            technique Diagnosis Code(s):        --- Professional ---                           K64.0, First degree hemorrhoids                           K63.5, Polyp of colon                           K92.1, Melena (includes Hematochezia)                           R19.5, Other fecal abnormalities                           K57.30, Diverticulosis of large intestine without                            perforation or abscess without bleeding CPT copyright 2019 American Medical Association. All rights reserved. The codes documented in this report are  preliminary and upon coder review may  be revised to meet current compliance requirements. Cristopher Estimable. Casondra Gasca, MD Norvel Richards, MD 09/12/2019 8:49:44 AM This report has been signed electronically. Number of Addenda: 0

## 2019-09-12 NOTE — Anesthesia Postprocedure Evaluation (Signed)
Anesthesia Post Note  Patient: Stacey Brooks  Procedure(s) Performed: COLONOSCOPY WITH PROPOFOL (N/A ) POLYPECTOMY  Patient location during evaluation: PACU Anesthesia Type: General Level of consciousness: awake and alert and oriented Pain management: pain level controlled Vital Signs Assessment: post-procedure vital signs reviewed and stable Respiratory status: spontaneous breathing Cardiovascular status: blood pressure returned to baseline and stable Postop Assessment: no apparent nausea or vomiting Anesthetic complications: no     Last Vitals:  Vitals:   09/12/19 0703  BP: 112/81  Pulse: 82  Resp: 18  Temp: 36.9 C  SpO2: 99%    Last Pain:  Vitals:   09/12/19 0703  TempSrc: Oral  PainSc: 0-No pain                 Dago Jungwirth

## 2019-09-13 ENCOUNTER — Other Ambulatory Visit: Payer: Self-pay | Admitting: *Deleted

## 2019-09-13 ENCOUNTER — Encounter: Payer: Self-pay | Admitting: Internal Medicine

## 2019-09-13 LAB — SURGICAL PATHOLOGY

## 2019-09-13 NOTE — Patient Outreach (Signed)
[facility]) Care Management  09/13/2019  Stacey Brooks [birth year] [id number]    CSW made a third and final attempt to try and contact patient today to perform phone assessment, as well as assess and assist with social work needs and services, without success.  A HIPAA compliant message was left for patient on voicemail.  CSW is currently awaiting a return call.  CSW will proceed with case closure in two business days, if a return call is not received in the meantime, as required number of phone attempts have been made and an outreach letter was mailed to patient's home allowing 10 business days for a response.  [name], BSW, MSW, LCSW  Licensed Education officer, [facility]  Mailing [place] [address] [place], [place] [address] Physical Address-[address], [place], [place] [address] Toll Free Main # [phone] Fax # [phone] Cell # [phone]  Office # [phone] [name]

## 2019-09-17 ENCOUNTER — Telehealth: Payer: Self-pay

## 2019-09-17 NOTE — Telephone Encounter (Signed)
Per RMR- Send letter to patient.  Send copy of letter with path to referring provider and PCP.   Needs office visit in 3 months to set up a repeat TCS.  Need to go the extra mile to make sure prep is good this time

## 2019-09-18 ENCOUNTER — Encounter: Payer: Self-pay | Admitting: *Deleted

## 2019-09-18 ENCOUNTER — Other Ambulatory Visit: Payer: Self-pay | Admitting: *Deleted

## 2019-09-18 NOTE — Patient Outreach (Signed)
[facility]) Care Management  09/18/2019  Stacey Brooks December 04, 1960 [id number]   CSW will perform a case closure on patient, due to inability to establish initial contact with patient, despite required number of phone attempts made and outreach letter mailed to patient's home allowing 10 business days for a response if patient was interested in receiving social work services through [place] with [facility].  CSW will notify patient's RNCM with [name], [name] of CSW's plans to close patient's case.  CSW will fax an update to patient's Primary Care Physician, Dr. Allyn Kenner to ensure that they are aware of CSW's involvement with patient's plan of care.    [name], BSW, MSW, LCSW  Licensed Education officer, environmental Health System  Mailing [place] [address], [place], [place] [address] Physical Address-[address], [place], [place] [address] Toll Free Main # [phone] Fax # [phone] Cell # [phone]  Office # [phone] [name]

## 2019-09-19 NOTE — Telephone Encounter (Signed)
OV made °

## 2019-09-25 ENCOUNTER — Telehealth: Payer: Self-pay | Admitting: Internal Medicine

## 2019-09-25 DIAGNOSIS — E782 Mixed hyperlipidemia: Secondary | ICD-10-CM | POA: Diagnosis not present

## 2019-09-25 DIAGNOSIS — I10 Essential (primary) hypertension: Secondary | ICD-10-CM | POA: Diagnosis not present

## 2019-09-25 NOTE — Telephone Encounter (Signed)
Pt had colonoscopy on 12/23 and RMR wants to repeat it in 3 months due to poor prep. Pt is aware of OV on 10/26/19. She said she was having black stools with blood. Please advise. [phone]

## 2019-09-25 NOTE — Telephone Encounter (Signed)
Spoke with pt. TCS was 09/11/2020 and pt was asked to repeat TCS in 3 months due to poor prep. Pt had a bowel movement yesterday and noticed that her formed stool was black. Pt didn't notice any blood when she wiped and no visible blood in the toilet. Pt isn't taking Pepto Bismol or iron supplements. Pt isn't having any pain, nausea, sob or weakness. Pt is ok with waiting 3 months to have TCS if advised. Pt isn't scheduled yet for her TCS.

## 2019-09-26 NOTE — Telephone Encounter (Signed)
OV in 10/2019 to schedule her TCS.  As far as black stools, I am just seeing this today, as I was off yesterday. Please call and ask patient if she has had any additional black stools and see how she feels. If any further black stools but no other symptoms, send today for CBC.

## 2019-09-26 NOTE — Telephone Encounter (Signed)
Spoke with pt. Pt hasn't had another bowel movement. No bleeding seen today, no nausea, no vomiting, no sob, no weakness felt.

## 2019-09-27 ENCOUNTER — Encounter: Payer: Self-pay | Admitting: *Deleted

## 2019-09-27 ENCOUNTER — Other Ambulatory Visit: Payer: Self-pay | Admitting: *Deleted

## 2019-09-27 NOTE — Telephone Encounter (Signed)
Ok. She can monitor for further black stools. Keep ov for 10/2019.

## 2019-09-27 NOTE — Telephone Encounter (Signed)
Spoke with pt. Pt is aware and will monitor symptoms. Pt will call back before apt if needed.
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
## 2019-10-02 ENCOUNTER — Other Ambulatory Visit: Payer: Self-pay | Admitting: *Deleted

## 2019-10-02 NOTE — Patient Outreach (Signed)
[facility] [place]) Care Management  10/02/2019  Stacey Brooks 1961-06-12 [id number]   CSW was able to make brief contact with patient today to follow-up regarding social work services and resources, as well as to ensure that patient received the List of Housing Resources in [place] and the List of Counseling Resources in [place] that [place] e-mailed to patient's address, per her request.  Patient admitted to receiving the list of resources; however, she reported that she accidentally deleted them when she was cleaning out her in-basket messages.  CSW agreed to e-mail the list of resources to patient again today for her review.  Patient went on to explain that she has not yet had an opportunity to converse with her father about her plans to move out of the apartment that they currently share, indicating that they have both been "under the weather".  Patient stated that she plans to review the list of resources this week, begin making calls and then talk with her father about her plans this weekend.  Patient encouraged CSW to contact her again next week to follow-up.  CSW will make arrangements to contact patient on Tuesday, October 09, 2019, around 9:00am.  Patient did not wish to begin receiving counseling services today for symptoms of Depression, Anxiety and Post-Traumatic Stress Disorder.  [name], BSW, MSW, LCSW  Licensed Education officer, environmental Health System  Mailing [place] [address] [place], [place] [address] Physical Address-[address] [place], [place], [place] [address] Toll Free Main # [phone] Fax # [phone] Cell # [phone]  Office # [phone] [name]

## 2019-10-09 ENCOUNTER — Other Ambulatory Visit: Payer: Self-pay | Admitting: *Deleted
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
## 2019-10-16 ENCOUNTER — Other Ambulatory Visit: Payer: Self-pay | Admitting: *Deleted

## 2019-10-16 NOTE — Patient Outreach (Signed)
[facility]) Care Management  10/16/2019  Stacey Brooks 1961/07/11 [id number]   CSW was able to make contact with patient today to follow-up regarding social work services and resources, as well as to assist with alternate housing arrangements.  Patient admitted to "accidentally deleting" CSW's e-mails again, which included the list of housing resources in [facility], as well as a list of mental health resources and counseling services, also in [place].  CSW e-mailed the list of resources to patient, for a third time, confirming that she has received them.  Patient reported that she will begin making calls this week and contact CSW directly if she has any questions.  CSW agreed to follow-up with patient again on Monday, October 22, 2019, around [place], BSW, MSW, [employer]  Licensed Clinical Social Worker  [place]  Mailing [place] [address] [place], [place] [address] Physical Address-[address] [place], [place], [place] [address] Toll Free Main # [phone] Fax # [phone] Cell # [phone]  Office # [phone] [name]

## 2019-10-22 ENCOUNTER — Other Ambulatory Visit: Payer: Self-pay | Admitting: *Deleted
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
## 2019-10-26 ENCOUNTER — Ambulatory Visit: Payer: Medicare Other | Admitting: Gastroenterology

## 2019-10-29 ENCOUNTER — Other Ambulatory Visit: Payer: Self-pay | Admitting: *Deleted

## 2019-10-29 NOTE — Patient Outreach (Signed)
[facility]) Care Management  10/29/2019  Stacey Brooks 1961-02-24 [id number]   CSW was able to make contact with patient today to follow-up regarding social work services and resources, as well as to try and assist patient with finding alternate housing arrangements.  Patient reported that she is in the process of trying to convince her daughter and her daughter's two children, ages 4 and 70, to move into a 3-bedroom apartment with her, splitting the cost of rent and utilities.  Patient went on to say that her daughter has been talking about leaving her husband for many years, trying to provide her with the opportunity to do so, if she is really serious.  Patient stated, "I have gone to two different apartment complexes in [place], requesting to be put on the waiting list for a one-bedroom apartment, as well as a three-bedroom apartment, so if the one-bedroom apartment comes available first, I will take it, but if the three-bedroom apartment comes available first, then I will have to give my daughter an ultimatum because I have to take care of myself first".  Patient went on to explain that she has also been placed on the waiting list at her current apartment complex, that way she can be close to her father if he requires her assistance.  Patient reported that her three siblings are still unwilling to assist her with their father's care, despite her informing them that she will be moving out of the apartment that her and her father currently share, just as soon as an apartment comes available for her.  Patient stated, "I have basically been responsible for caring for my father my entire adult life and having to pay someone out of my own pocket to care for him when I am not available, it is definitely time for someone else to step up to the plate".  CSW commended patient for realizing that her own health and well-being needs to now be her number one priority.    CSW was able to provide  counseling and supportive services to patient today, in addition to offering words of encouragement.  Patient stated, "Honey, I just appreciate you calling me each week to check in with me, you just don't know how much it means to me to have your support and how much it helps to lift my spirits".  CSW voiced understanding, agreeing to contact patient again next week, on Monday, November 05, 2019, around 9:00am.  As always, patient has been encouraged to contact CSW directly if additional social work needs arise in the meantime, or if patient requires emotional support.  [name], BSW, MSW, LCSW  Licensed Education officer, environmental Health System  Mailing [place] [address] [place], [place] [address] Physical Address-[address], [place], [place] [address] Toll Free Main # [phone] Fax # [phone] Cell # [phone]  Office # [phone] [name]

## 2019-10-31 ENCOUNTER — Other Ambulatory Visit: Payer: Self-pay

## 2019-10-31 ENCOUNTER — Ambulatory Visit (INDEPENDENT_AMBULATORY_CARE_PROVIDER_SITE_OTHER): Payer: Medicare Other | Admitting: Gastroenterology

## 2019-10-31 ENCOUNTER — Encounter: Payer: Self-pay | Admitting: Gastroenterology

## 2019-10-31 VITALS — BP 121/68 | HR 78 | Temp 97.1°F | Ht 63.0 in | Wt 190.4 lb

## 2019-10-31 DIAGNOSIS — K625 Hemorrhage of anus and rectum: Secondary | ICD-10-CM

## 2019-10-31 DIAGNOSIS — R195 Other fecal abnormalities: Secondary | ICD-10-CM | POA: Diagnosis not present

## 2019-10-31 DIAGNOSIS — Z86010 Personal history of colonic polyps: Secondary | ICD-10-CM

## 2019-10-31 DIAGNOSIS — K582 Mixed irritable bowel syndrome: Secondary | ICD-10-CM | POA: Diagnosis not present

## 2019-10-31 DIAGNOSIS — K5900 Constipation, unspecified: Secondary | ICD-10-CM | POA: Insufficient documentation

## 2019-10-31 MED ORDER — PEG 3350-KCL-NA BICARB-NACL 420 G PO SOLR: 4000.0000 mL | ORAL | 0 refills | Status: DC

## 2019-10-31 MED ORDER — LINACLOTIDE 145 MCG PO CAPS: 145.0000 ug | ORAL_CAPSULE | Freq: Every day | ORAL | 3 refills | Status: DC

## 2019-10-31 NOTE — Patient Instructions (Signed)
1. Start Linzess 145 mcg daily before breakfast for constipation. You can skip a day if you have multiple loose stools. 2. Use bentyl only for abdominal cramping or significant urgency to have bowel movements when you are away from your house. DO NOT TAKE FOR ONE WEEK BEFORE YOUR PROCEDURE. 3. Colonoscopy as scheduled. See separate instructions.

## 2019-10-31 NOTE — Progress Notes (Signed)
Primary Care Physician:  Celene Squibb, MD  Primary Gastroenterologist:  Garfield Cornea, MD   Chief Complaint  Patient presents with  . Follow-up    fu from TCS    HPI:  Stacey Brooks is a 59 y.o. female here to schedule a 81-month follow-up colonoscopy, due to poor prep.  Initial colonoscopy being done for rectal bleeding, heme positive stool, diarrhea.  She also has reflux well controlled on omeprazole 20 mg twice daily.  Colonoscopy 09/12/19 with inadequate bowel prep. She had internal hemorrhoids, 7 tubular adenomas removed, diverticulosis.   Patient received Clenpiq for her last prep. She states she was passing watery stools and thought she was clean. Based on her description, she did not take second dose early enough, nearly two hours after prescribed and therefore like did not have time to work.   She continues to have fluctuating bowel movements. Can go three days without a BM. Usually associated with cramping. But at other times has fecal urgency with fecal incontinence. She was given bentyl for this symptoms previously. Not taking bentyl often. No further rectal bleeding. No UGI symptoms.    Current Outpatient Medications  Medication Sig Dispense Refill  . albuterol (PROVENTIL HFA;VENTOLIN HFA) 108 (90 Base) MCG/ACT inhaler Inhale 2 puffs into the lungs every 6 (six) hours as needed for wheezing or shortness of breath.    Marland Kitchen amLODipine (NORVASC) 5 MG tablet Take 5 mg by mouth daily.     . cholecalciferol (VITAMIN D3) 25 MCG (1000 UT) tablet Take 1,000 Units by mouth daily.    Marland Kitchen dicyclomine (BENTYL) 10 MG capsule Take 1 capsule (10 mg total) by mouth 4 (four) times daily -  before meals and at bedtime. Hold for constipation 120 capsule 1  . escitalopram (LEXAPRO) 5 MG tablet Take 5 mg by mouth daily.    . hydrALAZINE (APRESOLINE) 25 MG tablet Take 25 mg by mouth 2 (two) times daily.    . hydrochlorothiazide (HYDRODIURIL) 25 MG tablet Take 25 mg by mouth daily.    Marland Kitchen lisinopril  (ZESTRIL) 40 MG tablet Take 40 mg by mouth daily.     Marland Kitchen lovastatin (MEVACOR) 20 MG tablet Take 20 mg by mouth at bedtime.     Marland Kitchen omeprazole (PRILOSEC) 20 MG capsule Take 1 capsule (20 mg total) by mouth 2 (two) times daily before a meal. (Patient taking differently: Take 20 mg by mouth 2 (two) times daily. ) 60 capsule 3  . oxybutynin (DITROPAN-XL) 10 MG 24 hr tablet Take 10 mg by mouth daily.     No current facility-administered medications for this visit.    Allergies as of 10/31/2019 - Review Complete 10/31/2019  Allergen Reaction Noted  . Clindamycin/lincomycin Rash 10/27/2015  . Erythromycin Itching and Rash 10/27/2015    Past Medical History:  Diagnosis Date  . ANA positive   . Anxiety   . Asthma    Intermittent; asthmatic bronchitis  . Back pain 06/25/2011  . Cancer ([place])    skin cancer L arm  . Carpal tunnel syndrome on right 10/11/2008  . Cervical nerve root impingement 10/03/2012  . Cervical spondylolysis 10/03/2012  . Chronic back pain   . Colon polyps 08/25/2012  . Constipation   . COPD (chronic obstructive pulmonary disease) ([place]) 06/25/2011  . De Quervain's disease (tenosynovitis) 07/02/2013  . Depression 06/25/2011  . Diverticulosis 08/25/2012  . Edema 07/03/2003  . Excessive hair 08/22/2012   body and facial   . Fatty liver 06/10/2015  . Fibromyalgia 06/30/2009  .  GERD (gastroesophageal reflux disease) 07/03/2003  . H/O bladder repair surgery 02/23/2013  . H/O neck surgery 10/03/2012  . Hemorrhoids 07/01/2014  . Hirsutism   . Hypercholesterolemia 07/05/2003   Mild  . Hyperlipidemia   . Hypertension   . IBS (irritable bowel syndrome) 2010  . Incontinence of urine in female 09/09/2003  . Insomnia 2010  . Lumbar degenerative disc disease   . Migraine 08/17/2019  . Neural foraminal stenosis of cervical spine   . Obesity 08/23/2013  . Obstructive chronic bronchitis ([place])   . Osteoarthritis 06/25/2011  . Pilar cyst 07/30/2011  . Polymyalgia ([place]) 03/31/2009  . SS-A  antibody positive 08/22/2012  . Suicide attempt ([place]) 06/25/2011  . Thyroid disease    Hypothyroidism  . Tobacco abuse 07/03/2003  . Trigger middle finger 05/30/2008    Past Surgical History:  Procedure Laterality Date  . ABDOMINAL HYSTERECTOMY  04/04/2013  . BACK SURGERY  2007  . CARPECTOMY WITH RADIAL STYLOIDECTOMY  01/05/2012   DeQuervain's Release   . CERVICAL FUSION  2007  . COLONOSCOPY WITH PROPOFOL N/A 09/12/2019   Procedure: COLONOSCOPY WITH PROPOFOL;  Surgeon: Daneil Dolin, MD;  [facility];  Service: Endoscopy;  Laterality: N/A;  7:30am  . POLYPECTOMY  09/12/2019   Procedure: POLYPECTOMY;  Surgeon: Daneil Dolin, MD;  Location: [facility];  Service: Endoscopy;;  hepatic flexure x5; cecal; descending  . WRIST ARTHROSCOPY  01/05/2012   release of carpal ligament    Family History  Problem Relation Age of Onset  . Emphysema Mother   . Hypertension Father   . Cancer Sister        Ovarian  . Cancer Maternal Grandmother   . Cancer Maternal Grandfather   . Colon cancer Neg Hx     Social History   Socioeconomic History  . Marital status: Divorced    Spouse name: Not on file  . Number of children: Not on file  . Years of education: Not on file  . Highest education level: Not on file  Occupational History  . Not on file  Tobacco Use  . Smoking status: Current Every Day Smoker    Packs/day: 0.25    Years: 35.00    Pack years: 8.75  . Smokeless tobacco: Never Used  . Tobacco comment: has used cocaine in the past, over 10 years  Substance and Sexual Activity  . Alcohol use: No    Alcohol/week: 0.0 standard drinks  . Drug use: Not Currently    Comment: has used cocaine  . Sexual activity: Not on file  Other Topics Concern  . Not on file  Social History Narrative   Widowed   Unemployed   Social Determinants of Health   Financial Resource Strain: High Risk  . Difficulty of Paying Living Expenses: Hard  Food Insecurity: Food Insecurity Present   . Worried About Charity fundraiser in the Last Year: Often true  . Ran Out of Food in the Last Year: Sometimes true  Transportation Needs: No Transportation Needs  . Lack of Transportation (Medical): No  . Lack of Transportation (Non-Medical): No  Physical Activity: Inactive  . Days of Exercise per Week: 0 days  . Minutes of Exercise per Session: 0 min  Stress: Stress Concern Present  . Feeling of Stress : Very much  Social Connections: Moderately Isolated  . Frequency of Communication with Friends and Family: Twice a week  . Frequency of Social Gatherings with Friends and Family: Twice a week  .  Attends Religious Services: Never  . Active Member of Clubs or Organizations: No  . Attends Archivist Meetings: Never  . Marital Status: Divorced  Human resources officer Violence: Not At Risk  . Fear of Current or Ex-Partner: No  . Emotionally Abused: No  . Physically Abused: No  . Sexually Abused: No      ROS:  General: Negative for anorexia, weight loss, fever, chills, fatigue, weakness. Eyes: Negative for vision changes.  ENT: Negative for hoarseness, difficulty swallowing , nasal congestion. CV: Negative for chest pain, angina, palpitations, dyspnea on exertion, peripheral edema.  Respiratory: Negative for dyspnea at rest, dyspnea on exertion, cough, sputum, wheezing.  GI: See history of present illness. GU:  Negative for dysuria, hematuria, urinary incontinence, urinary frequency, nocturnal urination.  MS: Negative for joint pain, low back pain.  Derm: Negative for rash or itching.  Neuro: Negative for weakness, abnormal sensation, seizure, frequent headaches, memory loss, confusion.  Psych: Negative for anxiety, depression, suicidal ideation, hallucinations.  Endo: Negative for unusual weight change.  Heme: Negative for bruising or bleeding. Allergy: Negative for rash or hives.    Physical Examination:  BP 121/68   Pulse 78   Temp (!) 97.1 F (36.2 C) (Temporal)    Ht 5\' 3"  (1.6 m)   Wt 190 lb 6.4 oz (86.4 kg)   BMI 33.73 kg/m    General: Well-nourished, well-developed in no acute distress.  Head: Normocephalic, atraumatic.   Eyes: Conjunctiva pink, no icterus. Mouth: masked. Neck: Supple without thyromegaly, masses, or lymphadenopathy.  Lungs: Clear to auscultation bilaterally.  Heart: Regular rate and rhythm, no murmurs rubs or gallops.  Abdomen: Bowel sounds are normal, nontender, nondistended, no hepatosplenomegaly or masses, no abdominal bruits or    hernia , no rebound or guarding.   Rectal: not performed Extremities: No lower extremity edema. No clubbing or deformities.  Neuro: Alert and oriented x 4 , grossly normal neurologically.  Skin: Warm and dry, no rash or jaundice.   Psych: Alert and cooperative, normal mood and affect.  Labs: Lab Results  Component Value Date   CREATININE 0.91 09/10/2019   BUN 12 09/10/2019   NA 139 09/10/2019   K 3.2 (L) 09/10/2019   CL 106 09/10/2019   CO2 23 09/10/2019   Lab Results  Component Value Date   WBC 9.7 08/24/2019   HGB 12.9 08/24/2019   HCT 39.7 08/24/2019   MCV 87.8 08/24/2019   PLT 264 08/24/2019     Imaging Studies: No results found.

## 2019-11-01 ENCOUNTER — Encounter: Payer: Self-pay | Admitting: Gastroenterology

## 2019-11-01 NOTE — Assessment & Plan Note (Signed)
H/o alternating constipation/diarrhea likely due to IBS. Rectal bleeding resolved. H/O tubular adenomas, with total of 7 polyps at time of colonoscopy 08/2019. Prep was inadequate therefore she presents back for short interval surveillance colonoscopy. Plan for two days of clear liquids. Begin Linzess 118mcg daily now. Will make sure she takes the week prior to colonoscopy as well. If stools are not regular she will let us know. Trilyte bowel prep.  I have discussed the risks, alternatives, benefits with regards to but not limited to the risk of reaction to medication, bleeding, infection, perforation and the patient is agreeable to proceed. Written consent to be obtained.

## 2019-11-02 NOTE — Progress Notes (Signed)
Cc'ed to pcp °

## 2019-11-05 ENCOUNTER — Encounter: Payer: Self-pay | Admitting: *Deleted

## 2019-11-05 ENCOUNTER — Other Ambulatory Visit: Payer: Self-pay | Admitting: *Deleted

## 2019-11-05 NOTE — Patient Outreach (Signed)
[facility] ([facility] Care Management  11/05/2019  Stacey Brooks 02/05/1961 [id number]   CSW was able to make contact with patient today to follow-up regarding social work services and resources, as well as to try and assist patient with making alternate housing arrangements.  Patient admitted that she was extremely happy today, having reconnected with her old high school boyfriend and considering the possibility of moving in with him.  Patient stated, "Even if I don't move in with him, I can at least spend the night at his house a few times a week, which will give me a break from my father".    Patient talked a lot about her relationship with her boyfriend and how he is extremely caring and supportive of her, and always has been.  Patient admitted that she has a great deal of hope for her future and believes that her boyfriend will "relieve her from the torment of her father".  Patient continues to deny the need for ongoing counseling services with a community therapist, nor is patient interested in CSW continuing to provide counseling and supportive services telephonically.  Patient stated, "You will never know how much you have already helped me, just by listening".  Patient further reported that she no longer requires CSW's assistance with obtaining housing, admitting that she will either be moving in with her boyfriend or her and her daughter will be finding a place together.  Patient went on to say that there are two apartment complexes in [place] that currently have vacancies, for both a one-bedroom and a three-bedroom.  Patient indicated that she "plans to make her move" within the next month, having saved enough money for application fees, deposits and first months rent.  CSW will perform a case closure on patient, as all goals of treatment have been met from social work standpoint and no additional social work needs have been identified at this time.  CSW will fax an update to patient's  Primary Care Physician, Dr. John Hall to ensure that they are aware of CSW's involvement with patient's plan of care.  CSW was able to confirm that patient has the correct contact information for CSW, encouraging patient to contact CSW directly if additional social work needs arise in the near future.  Patient voiced understanding and was agreeable to this plan.    Joanna Saporito, BSW, MSW, LCSW  Licensed Clinical Social Worker  [facility] [place] [facility]  Mailing Address-[address], [place], [place] [address] Physical Address-[address], [place], [place] [address] Toll Free Main # [phone] Fax # [phone] Cell # [phone]  Office # [phone] [email or web address]        

## 2019-12-10 DIAGNOSIS — I10 Essential (primary) hypertension: Secondary | ICD-10-CM | POA: Diagnosis not present

## 2019-12-10 DIAGNOSIS — E782 Mixed hyperlipidemia: Secondary | ICD-10-CM | POA: Diagnosis not present

## 2020-01-08 ENCOUNTER — Encounter: Payer: Self-pay | Admitting: *Deleted

## 2020-01-08 ENCOUNTER — Telehealth: Payer: Self-pay | Admitting: Internal Medicine

## 2020-01-08 NOTE — Telephone Encounter (Signed)
[phone] PATIENT NEEDS TO RESCHEDULE HER PROCEDURE

## 2020-01-08 NOTE — Telephone Encounter (Signed)
Called spoke with pt. She needs to r/s procedure. This has been rescheduled to 6/15 at 11:15am. Patient aware will mail new prep instructions with her new pre-op/covid test appt. Called endo and aware of appt change.

## 2020-01-11 ENCOUNTER — Other Ambulatory Visit (HOSPITAL_COMMUNITY): Payer: Medicare Other

## 2020-01-11 ENCOUNTER — Encounter (HOSPITAL_COMMUNITY): Admission: RE | Admit: 2020-01-11 | Payer: Medicare Other | Source: Ambulatory Visit

## 2020-02-12 DIAGNOSIS — E782 Mixed hyperlipidemia: Secondary | ICD-10-CM | POA: Diagnosis not present

## 2020-02-12 DIAGNOSIS — I10 Essential (primary) hypertension: Secondary | ICD-10-CM | POA: Diagnosis not present

## 2020-02-28 ENCOUNTER — Telehealth: Payer: Self-pay | Admitting: Internal Medicine

## 2020-02-28 NOTE — Telephone Encounter (Signed)
Communication noted.  No further recommendations. 

## 2020-02-28 NOTE — Telephone Encounter (Signed)
FYI Spoke with pt. Pt has a hx of IBS. Pt is having a BM daily with Linzess and noticed some abdominal pain a few days ago. Abdominal pain is without n/v, pt isn't feeling lightheaded. Pt is aware that it's ok to have her procedure on 03/04/20. Pt mentioned she is having her second covid vaccine on tomorrow Friday 02/29/20. Pt is aware that she is ok to have her procedure. Pt was concerned about feeling bad after her vaccine. Pt was advised to contact our office if that occurs.

## 2020-02-28 NOTE — Telephone Encounter (Signed)
PATIENT CALLED STATING THAT SHE HAS HAD ABD PAIN THE PAST FEW DAYS AND WANTS TO KNOW IF SHE SHOULD STILL GO AHEAD AND DRINK HER PREP AND HAVE THE PROCEDURE

## 2020-02-29 NOTE — Patient Instructions (Signed)
Stacey Brooks  02/29/2020     @PREFPERIOPPHARMACY @   Your procedure is scheduled on  03/04/2020.  Report to [facility] at  [facility].  Call this number if you have problems the morning of surgery:  [phone]   Remember:  Follow the diet and prep instructions given to you by Dr Roseanne Kaufman office.                        Take these medicines the morning of surgery with A SIP OF WATER  Amlodipine, bentyl(if needed), lexapro, prilosec, ditropan.    Do not wear jewelry, make-up or nail polish.  Do not wear lotions, powders, or perfumes. Please wear deodorant and brush your teeth.  Do not shave 48 hours prior to surgery.  Men may shave face and neck.  Do not bring valuables to the hospital.  [facility] is not responsible for any belongings or valuables.  Contacts, dentures or bridgework may not be worn into surgery.  Leave your suitcase in the car.  After surgery it may be brought to your room.  For patients admitted to the hospital, discharge time will be determined by your treatment team.  Patients discharged the day of surgery will not be allowed to drive home.   Name and phone number of your driver:   family Special instructions:  DO NOT smoke the morning of your procedure.  Please read over the following fact sheets that you were given. Anesthesia Post-op Instructions and Care and Recovery After Surgery       Colonoscopy, Adult, Care After This sheet gives you information about how to care for yourself after your procedure. Your health care provider may also give you more specific instructions. If you have problems or questions, contact your health care provider. What can I expect after the procedure? After the procedure, it is common to have:  A small amount of blood in your stool for 24 hours after the procedure.  Some gas.  Mild cramping or bloating of your abdomen. Follow these instructions at home: Eating and drinking   Drink enough fluid to keep  your urine pale yellow.  Follow instructions from your health care provider about eating or drinking restrictions.  Resume your normal diet as instructed by your health care provider. Avoid heavy or fried foods that are hard to digest. Activity  Rest as told by your health care provider.  Avoid sitting for a long time without moving. Get up to take short walks every 1-2 hours. This is important to improve blood flow and breathing. Ask for help if you feel weak or unsteady.  Return to your normal activities as told by your health care provider. Ask your health care provider what activities are safe for you. Managing cramping and bloating   Try walking around when you have cramps or feel bloated.  Apply heat to your abdomen as told by your health care provider. Use the heat source that your health care provider recommends, such as a moist heat pack or a heating pad. ? Place a towel between your skin and the heat source. ? Leave the heat on for 20-30 minutes. ? Remove the heat if your skin turns bright red. This is especially important if you are unable to feel pain, heat, or cold. You may have a greater risk of getting burned. General instructions  For the first 24 hours after the procedure: ? Do not drive or  use machinery. ? Do not sign important documents. ? Do not drink alcohol. ? Do your regular daily activities at a slower pace than normal. ? Eat soft foods that are easy to digest.  Take over-the-counter and prescription medicines only as told by your health care provider.  Keep all follow-up visits as told by your health care provider. This is important. Contact a health care provider if:  You have blood in your stool 2-3 days after the procedure. Get help right away if you have:  More than a small spotting of blood in your stool.  Large blood clots in your stool.  Swelling of your abdomen.  Nausea or vomiting.  A fever.  Increasing pain in your abdomen that is not  relieved with medicine. Summary  After the procedure, it is common to have a small amount of blood in your stool. You may also have mild cramping and bloating of your abdomen.  For the first 24 hours after the procedure, do not drive or use machinery, sign important documents, or drink alcohol.  Get help right away if you have a lot of blood in your stool, nausea or vomiting, a fever, or increased pain in your abdomen. This information is not intended to replace advice given to you by your health care provider. Make sure you discuss any questions you have with your health care provider. Document Revised: 04/02/2019 Document Reviewed: 04/02/2019 Elsevier Patient Education  [place] After These instructions provide you with information about caring for yourself after your procedure. Your health care provider may also give you more specific instructions. Your treatment has been planned according to current medical practices, but problems sometimes occur. Call your health care provider if you have any problems or questions after your procedure. What can I expect after the procedure? After your procedure, you may:  Feel sleepy for several hours.  Feel clumsy and have poor balance for several hours.  Feel forgetful about what happened after the procedure.  Have poor judgment for several hours.  Feel nauseous or vomit.  Have a sore throat if you had a breathing tube during the procedure. Follow these instructions at home: For at least 24 hours after the procedure:      Have a responsible adult stay with you. It is important to have someone help care for you until you are awake and alert.  Rest as needed.  Do not: ? Participate in activities in which you could fall or become injured. ? Drive. ? Use heavy machinery. ? Drink alcohol. ? Take sleeping pills or medicines that cause drowsiness. ? Make important decisions or sign legal  documents. ? Take care of children on your own. Eating and drinking  Follow the diet that is recommended by your health care provider.  If you vomit, drink water, juice, or soup when you can drink without vomiting.  Make sure you have little or no nausea before eating solid foods. General instructions  Take over-the-counter and prescription medicines only as told by your health care provider.  If you have sleep apnea, surgery and certain medicines can increase your risk for breathing problems. Follow instructions from your health care provider about wearing your sleep device: ? Anytime you are sleeping, including during daytime naps. ? While taking prescription pain medicines, sleeping medicines, or medicines that make you drowsy.  If you smoke, do not smoke without supervision.  Keep all follow-up visits as told by your health care provider. This is important.  Contact a health care provider if:  You keep feeling nauseous or you keep vomiting.  You feel light-headed.  You develop a rash.  You have a fever. Get help right away if:  You have trouble breathing. Summary  For several hours after your procedure, you may feel sleepy and have poor judgment.  Have a responsible adult stay with you for at least 24 hours or until you are awake and alert. This information is not intended to replace advice given to you by your health care provider. Make sure you discuss any questions you have with your health care provider. Document Revised: 12/05/2017 Document Reviewed: 12/28/2015 Elsevier Patient Education  [place].

## 2020-03-03 ENCOUNTER — Other Ambulatory Visit: Payer: Self-pay

## 2020-03-03 ENCOUNTER — Encounter (HOSPITAL_COMMUNITY)
Admission: RE | Admit: 2020-03-03 | Discharge: 2020-03-03 | Disposition: A | Discharge status: Home / Self Care | Payer: Medicare Other | Source: Ambulatory Visit | Attending: Internal Medicine | Admitting: Internal Medicine

## 2020-03-03 ENCOUNTER — Telehealth: Payer: Self-pay

## 2020-03-03 ENCOUNTER — Other Ambulatory Visit (HOSPITAL_COMMUNITY)
Admission: RE | Admit: 2020-03-03 | Discharge: 2020-03-03 | Disposition: A | Discharge status: Home / Self Care | Payer: Medicare Other | Source: Ambulatory Visit | Attending: Internal Medicine | Admitting: Internal Medicine

## 2020-03-03 ENCOUNTER — Encounter (HOSPITAL_COMMUNITY): Payer: Self-pay

## 2020-03-03 DIAGNOSIS — Z20822 Contact with and (suspected) exposure to covid-19: Secondary | ICD-10-CM | POA: Diagnosis not present

## 2020-03-03 DIAGNOSIS — Z01818 Encounter for other preprocedural examination: Secondary | ICD-10-CM | POA: Diagnosis not present

## 2020-03-03 LAB — BASIC METABOLIC PANEL
Anion gap: 11 (ref 5–15)
BUN: 12 mg/dL (ref 6–20)
CO2: 22 mmol/L (ref 22–32)
Calcium: 8.8 mg/dL — ABNORMAL LOW (ref 8.9–10.3)
Chloride: 108 mmol/L (ref 98–111)
Creatinine, Ser: 0.78 mg/dL (ref 0.44–1.00)
GFR calc Af Amer: >60 mL/min (ref >60)
GFR calc non Af Amer: >60 mL/min (ref >60)
Glucose, Bld: 111 mg/dL — ABNORMAL HIGH (ref 70–99)
Potassium: 3.2 mmol/L — ABNORMAL LOW (ref 3.5–5.1)
Sodium: 141 mmol/L (ref 135–145)

## 2020-03-03 LAB — SARS CORONAVIRUS 2 (TAT 6-24 HRS): SARS Coronavirus 2: NEGATIVE

## 2020-03-03 MED ORDER — POTASSIUM CHLORIDE CRYS ER 20 MEQ PO TBCR: 40.0000 meq | EXTENDED_RELEASE_TABLET | Freq: Every day | ORAL | 0 refills | Status: DC

## 2020-03-03 MED ORDER — PEG 3350-KCL-NA BICARB-NACL 420 G PO SOLR: 4000.0000 mL | ORAL | 0 refills | Status: DC

## 2020-03-03 NOTE — Telephone Encounter (Signed)
Pt called and was suppose to start her prep today at 2pm for her TCS. Pt states [facility] doesn't have her medication. Pt is asking that the prep be sent to [facility].

## 2020-03-03 NOTE — Telephone Encounter (Signed)
Called pt, she had pharmacy on hold on other line. Pharmacy was trying to see if they could use rx on file (original rx was sent 10/2019). Informed pt I would send another rx in case they need a new one. Rx sent.

## 2020-03-04 ENCOUNTER — Ambulatory Visit (HOSPITAL_COMMUNITY)
Admission: RE | Admit: 2020-03-04 | Discharge: 2020-03-04 | Disposition: A | Discharge status: Home / Self Care | Payer: Medicare Other | Attending: Internal Medicine | Admitting: Internal Medicine

## 2020-03-04 ENCOUNTER — Encounter (HOSPITAL_COMMUNITY): Payer: Self-pay | Admitting: Internal Medicine

## 2020-03-04 ENCOUNTER — Ambulatory Visit (HOSPITAL_COMMUNITY): Payer: Medicare Other | Admitting: Anesthesiology

## 2020-03-04 ENCOUNTER — Other Ambulatory Visit: Payer: Self-pay

## 2020-03-04 ENCOUNTER — Encounter (HOSPITAL_COMMUNITY)
Admission: RE | Disposition: A | Discharge status: Home / Self Care | Payer: Self-pay | Source: Home / Self Care | Attending: Internal Medicine

## 2020-03-04 DIAGNOSIS — J4520 Mild intermittent asthma, uncomplicated: Secondary | ICD-10-CM | POA: Diagnosis not present

## 2020-03-04 DIAGNOSIS — Z6834 Body mass index (BMI) 34.0-34.9, adult: Secondary | ICD-10-CM | POA: Diagnosis not present

## 2020-03-04 DIAGNOSIS — Z79899 Other long term (current) drug therapy: Secondary | ICD-10-CM | POA: Insufficient documentation

## 2020-03-04 DIAGNOSIS — F17210 Nicotine dependence, cigarettes, uncomplicated: Secondary | ICD-10-CM | POA: Diagnosis not present

## 2020-03-04 DIAGNOSIS — E039 Hypothyroidism, unspecified: Secondary | ICD-10-CM | POA: Insufficient documentation

## 2020-03-04 DIAGNOSIS — M797 Fibromyalgia: Secondary | ICD-10-CM | POA: Diagnosis not present

## 2020-03-04 DIAGNOSIS — M353 Polymyalgia rheumatica: Secondary | ICD-10-CM | POA: Insufficient documentation

## 2020-03-04 DIAGNOSIS — F419 Anxiety disorder, unspecified: Secondary | ICD-10-CM | POA: Diagnosis not present

## 2020-03-04 DIAGNOSIS — Z86010 Personal history of colonic polyps: Secondary | ICD-10-CM | POA: Diagnosis not present

## 2020-03-04 DIAGNOSIS — J449 Chronic obstructive pulmonary disease, unspecified: Secondary | ICD-10-CM | POA: Insufficient documentation

## 2020-03-04 DIAGNOSIS — E785 Hyperlipidemia, unspecified: Secondary | ICD-10-CM | POA: Diagnosis not present

## 2020-03-04 DIAGNOSIS — K5790 Diverticulosis of intestine, part unspecified, without perforation or abscess without bleeding: Secondary | ICD-10-CM | POA: Diagnosis not present

## 2020-03-04 DIAGNOSIS — K635 Polyp of colon: Secondary | ICD-10-CM

## 2020-03-04 DIAGNOSIS — Z09 Encounter for follow-up examination after completed treatment for conditions other than malignant neoplasm: Secondary | ICD-10-CM | POA: Diagnosis not present

## 2020-03-04 DIAGNOSIS — E7800 Pure hypercholesterolemia, unspecified: Secondary | ICD-10-CM | POA: Diagnosis not present

## 2020-03-04 DIAGNOSIS — F329 Major depressive disorder, single episode, unspecified: Secondary | ICD-10-CM | POA: Insufficient documentation

## 2020-03-04 DIAGNOSIS — K589 Irritable bowel syndrome without diarrhea: Secondary | ICD-10-CM | POA: Diagnosis not present

## 2020-03-04 DIAGNOSIS — E669 Obesity, unspecified: Secondary | ICD-10-CM | POA: Insufficient documentation

## 2020-03-04 DIAGNOSIS — I10 Essential (primary) hypertension: Secondary | ICD-10-CM | POA: Diagnosis not present

## 2020-03-04 DIAGNOSIS — D123 Benign neoplasm of transverse colon: Secondary | ICD-10-CM | POA: Insufficient documentation

## 2020-03-04 DIAGNOSIS — K219 Gastro-esophageal reflux disease without esophagitis: Secondary | ICD-10-CM | POA: Insufficient documentation

## 2020-03-04 DIAGNOSIS — K5730 Diverticulosis of large intestine without perforation or abscess without bleeding: Secondary | ICD-10-CM | POA: Diagnosis not present

## 2020-03-04 DIAGNOSIS — Z881 Allergy status to other antibiotic agents status: Secondary | ICD-10-CM | POA: Insufficient documentation

## 2020-03-04 DIAGNOSIS — Z85828 Personal history of other malignant neoplasm of skin: Secondary | ICD-10-CM | POA: Insufficient documentation

## 2020-03-04 HISTORY — PX: COLONOSCOPY WITH PROPOFOL: SHX5780

## 2020-03-04 HISTORY — PX: POLYPECTOMY: SHX5525

## 2020-03-04 SURGERY — COLONOSCOPY WITH PROPOFOL
Anesthesia: General

## 2020-03-04 MED ORDER — EPHEDRINE 5 MG/ML INJ
INTRAVENOUS | Status: AC
  Filled 2020-03-04: qty 10

## 2020-03-04 MED ORDER — LACTATED RINGERS IV SOLN: INTRAVENOUS | Status: DC

## 2020-03-04 MED ORDER — KETAMINE HCL 10 MG/ML IJ SOLN
INTRAMUSCULAR | Status: DC | PRN
  Administered 2020-03-04: 20 mg via INTRAVENOUS

## 2020-03-04 MED ORDER — KETAMINE HCL 50 MG/5ML IJ SOSY
PREFILLED_SYRINGE | INTRAMUSCULAR | Status: AC
  Filled 2020-03-04: qty 5

## 2020-03-04 MED ORDER — EPHEDRINE SULFATE 50 MG/ML IJ SOLN
INTRAMUSCULAR | Status: DC | PRN
  Administered 2020-03-04 (×2): 10 mg via INTRAVENOUS

## 2020-03-04 MED ORDER — PROPOFOL 10 MG/ML IV BOLUS
INTRAVENOUS | Status: DC | PRN
  Administered 2020-03-04 (×2): 20 mg via INTRAVENOUS
  Administered 2020-03-04: 50 mg via INTRAVENOUS

## 2020-03-04 MED ORDER — CHLORHEXIDINE GLUCONATE CLOTH 2 % EX PADS: 6.0000 | MEDICATED_PAD | Freq: Once | CUTANEOUS | Status: DC

## 2020-03-04 MED ORDER — LACTATED RINGERS IV SOLN: INTRAVENOUS | Status: DC | PRN

## 2020-03-04 MED ORDER — PROPOFOL 500 MG/50ML IV EMUL
INTRAVENOUS | Status: DC | PRN
  Administered 2020-03-04: 150 ug/kg/min via INTRAVENOUS

## 2020-03-04 NOTE — Anesthesia Preprocedure Evaluation (Signed)
Anesthesia Evaluation  Patient identified by MRN, date of birth, ID band Patient awake    Reviewed: Allergy & Precautions, H&P , NPO status , Patient's Chart, lab work & pertinent test results, reviewed documented beta blocker date and time   Airway Mallampati: II  TM Distance: >3 FB Neck ROM: full    Dental no notable dental hx. (+) Teeth Intact   Pulmonary asthma , COPD,  COPD inhaler, Current Smoker,    Pulmonary exam normal breath sounds clear to auscultation       Cardiovascular Exercise Tolerance: Good hypertension, negative cardio ROS   Rhythm:regular Rate:Normal     Neuro/Psych  Headaches, PSYCHIATRIC DISORDERS Anxiety Depression  Neuromuscular disease    GI/Hepatic Neg liver ROS, GERD  Medicated,  Endo/Other  negative endocrine ROS  Renal/GU negative Renal ROS  negative genitourinary   Musculoskeletal   Abdominal   Peds  Hematology negative hematology ROS (+)   Anesthesia Other Findings   Reproductive/Obstetrics negative OB ROS                             Anesthesia Physical Anesthesia Plan  ASA: III  Anesthesia Plan: General   Post-op Pain Management:    Induction:   PONV Risk Score and Plan: 2 and Propofol infusion  Airway Management Planned:   Additional Equipment:   Intra-op Plan:   Post-operative Plan:   Informed Consent: I have reviewed the patients History and Physical, chart, labs and discussed the procedure including the risks, benefits and alternatives for the proposed anesthesia with the patient or authorized representative who has indicated his/her understanding and acceptance.     Dental Advisory Given  Plan Discussed with: CRNA  Anesthesia Plan Comments:         Anesthesia Quick Evaluation

## 2020-03-04 NOTE — H&P (Signed)
@LOGO @   Primary Care Physician:  Celene Squibb, MD Primary Gastroenterologist:  Dr. Gala Romney  Pre-Procedure History & Physical: HPI:  Stacey Brooks is a 59 y.o. female here for follow-up/surveillance colonoscopy.  Colonoscopy done December 2020 for Hemoccult positive stool poor prep.  However, 7 polyps removed.  She was asked to come back early interval colonoscopy in the setting of an adequate preparation. Patient on Linzess.  Bowel function is normalized.  Past Medical History:  Diagnosis Date  . ANA positive   . Anxiety   . Asthma    Intermittent; asthmatic bronchitis  . Back pain 06/25/2011  . Cancer ([place])    skin cancer L arm  . Carpal tunnel syndrome on right 10/11/2008  . Cervical nerve root impingement 10/03/2012  . Cervical spondylolysis 10/03/2012  . Chronic back pain   . Colon polyps 08/25/2012  . Constipation   . COPD (chronic obstructive pulmonary disease) ([place]) 06/25/2011  . De Quervain's disease (tenosynovitis) 07/02/2013  . Depression 06/25/2011  . Diverticulosis 08/25/2012  . Edema 07/03/2003  . Excessive hair 08/22/2012   body and facial   . Fatty liver 06/10/2015  . Fibromyalgia 06/30/2009  . GERD (gastroesophageal reflux disease) 07/03/2003  . H/O bladder repair surgery 02/23/2013  . H/O neck surgery 10/03/2012  . Hemorrhoids 07/01/2014  . Hirsutism   . Hypercholesterolemia 07/05/2003   Mild  . Hyperlipidemia   . Hypertension   . IBS (irritable bowel syndrome) 2010  . Incontinence of urine in female 09/09/2003  . Insomnia 2010  . Lumbar degenerative disc disease   . Migraine 08/17/2019  . Neural foraminal stenosis of cervical spine   . Obesity 08/23/2013  . Obstructive chronic bronchitis ([place])   . Osteoarthritis 06/25/2011  . Pilar cyst 07/30/2011  . Polymyalgia ([place]) 03/31/2009  . SS-A antibody positive 08/22/2012  . Suicide attempt ([place]) 06/25/2011  . Thyroid disease    Hypothyroidism  . Tobacco abuse 07/03/2003  . Trigger middle finger 05/30/2008     Past Surgical History:  Procedure Laterality Date  . ABDOMINAL HYSTERECTOMY  04/04/2013  . BACK SURGERY  2007  . CARPECTOMY WITH RADIAL STYLOIDECTOMY  01/05/2012   DeQuervain's Release   . CERVICAL FUSION  2007  . COLONOSCOPY WITH PROPOFOL N/A 09/12/2019   Procedure: COLONOSCOPY WITH PROPOFOL;  Surgeon: Daneil Dolin, MD;  [facility]: [facility];  Service: Endoscopy;  Laterality: N/A;  7:30am  . POLYPECTOMY  09/12/2019   Procedure: POLYPECTOMY;  Surgeon: Daneil Dolin, MD;  Location: [facility];  Service: Endoscopy;;  hepatic flexure x5; cecal; descending  . WRIST ARTHROSCOPY  01/05/2012   release of carpal ligament    Prior to Admission medications   Medication Sig Start Date End Date Taking? Authorizing Provider  albuterol (PROVENTIL HFA;VENTOLIN HFA) 108 (90 Base) MCG/ACT inhaler Inhale 2 puffs into the lungs every 6 (six) hours as needed for wheezing or shortness of breath.   Yes [provider]  amLODipine (NORVASC) 5 MG tablet Take 5 mg by mouth daily.  03/03/19  Yes [provider]  cholecalciferol (VITAMIN D3) 25 MCG (1000 UT) tablet Take 1,000 Units by mouth daily.   Yes [provider]  dicyclomine (BENTYL) 10 MG capsule Take 1 capsule (10 mg total) by mouth 4 (four) times daily -  before meals and at bedtime. Hold for constipation Patient taking differently: Take 10 mg by mouth 3 (three) times daily as needed for spasms. Hold for constipation 07/31/19  Yes Mahala Menghini, PA-C  escitalopram (LEXAPRO) 5 MG tablet Take 5 mg by mouth daily.   Yes [provider]  hydrALAZINE (APRESOLINE) 25 MG tablet Take 25 mg by mouth 2 (two) times daily.   Yes [provider]  hydrochlorothiazide (HYDRODIURIL) 25 MG tablet Take 25 mg by mouth daily. 08/27/19  Yes [provider]  linaclotide Rolan Lipa) 145 MCG CAPS capsule Take 1 capsule (145 mcg total) by mouth daily before breakfast. 10/31/19  Yes Mahala Menghini, PA-C  lisinopril  (ZESTRIL) 40 MG tablet Take 40 mg by mouth daily.  07/15/17  Yes [provider]  lovastatin (MEVACOR) 20 MG tablet Take 20 mg by mouth at bedtime.  06/03/17  Yes [provider]  omeprazole (PRILOSEC) 20 MG capsule Take 1 capsule (20 mg total) by mouth 2 (two) times daily before a meal. Patient taking differently: Take 20 mg by mouth 2 (two) times daily.  04/11/19  Yes Carlis Stable, NP  oxybutynin (DITROPAN-XL) 10 MG 24 hr tablet Take 10 mg by mouth daily.   Yes [provider]  polyethylene glycol-electrolytes (TRILYTE) 420 g solution Take 4,000 mLs by mouth as directed. 10/31/19  Yes Mailynn Everly, Cristopher Estimable, MD  polyethylene glycol-electrolytes (TRILYTE) 420 g solution Take 4,000 mLs by mouth as directed. 03/03/20  Yes Layson Bertsch, Cristopher Estimable, MD  potassium chloride SA (KLOR-CON) 20 MEQ tablet Take 2 tablets (40 mEq total) by mouth daily for 2 days. 03/03/20 03/05/20 Yes RourkCristopher Estimable, MD    Allergies as of 10/31/2019 - Review Complete 10/31/2019  Allergen Reaction Noted  . Clindamycin/lincomycin Rash 10/27/2015  . Erythromycin Itching and Rash 10/27/2015    Family History  Problem Relation Age of Onset  . Emphysema Mother   . Hypertension Father   . Cancer Sister        Ovarian  . Cancer Maternal Grandmother   . Cancer Maternal Grandfather   . Colon cancer Neg Hx     Social History   Socioeconomic History  . Marital status: Divorced    Spouse name: Not on file  . Number of children: Not on file  . Years of education: Not on file  . Highest education level: Not on file  Occupational History  . Not on file  Tobacco Use  . Smoking status: Current Every Day Smoker    Packs/day: 0.25    Years: 35.00    Pack years: 8.75    Types: Cigarettes  . Smokeless tobacco: Never Used  . Tobacco comment: has used cocaine in the past, over 10 years  Vaping Use  . Vaping Use: Never used  Substance and Sexual Activity  . Alcohol use: No    Alcohol/week: 0.0 standard drinks  .  Drug use: Not Currently    Comment: has used cocaine  . Sexual activity: Not on file  Other Topics Concern  . Not on file  Social History Narrative   Widowed   Unemployed   Social Determinants of Health   Financial Resource Strain: High Risk  . Difficulty of Paying Living Expenses: Hard  Food Insecurity: Food Insecurity Present  . Worried About Charity fundraiser in the Last Year: Often true  . Ran Out of Food in the Last Year: Sometimes true  Transportation Needs: No Transportation Needs  . Lack of Transportation (Medical): No  . Lack of Transportation (Non-Medical): No  Physical Activity: Inactive  . Days of Exercise per Week: 0 days  . Minutes of Exercise per Session: 0 min  Stress: Stress Concern  Present  . Feeling of Stress : Very much  Social Connections: Socially Isolated  . Frequency of Communication with Friends and Family: Twice a week  . Frequency of Social Gatherings with Friends and Family: Twice a week  . Attends Religious Services: Never  . Active Member of Clubs or Organizations: No  . Attends Archivist Meetings: Never  . Marital Status: Divorced  Human resources officer Violence: Not At Risk  . Fear of Current or Ex-Partner: No  . Emotionally Abused: No  . Physically Abused: No  . Sexually Abused: No    Review of Systems: See HPI, otherwise negative ROS  Physical Exam: There were no vitals taken for this visit. General:   Alert,  Well-developed, well-nourished, pleasant and cooperative in NAD Mouth:  No deformity or lesions. Neck:  Supple; no masses or thyromegaly. No significant cervical adenopathy. Lungs:  Clear throughout to auscultation.   No wheezes, crackles, or rhonchi. No acute distress. Heart:  Regular rate and rhythm; no murmurs, clicks, rubs,  or gallops. Abdomen: Non-distended, normal bowel sounds.  Soft and nontender without appreciable mass or hepatosplenomegaly.  Pulses:  Normal pulses noted. Extremities:  Without clubbing or  edema.  1. Impression/Plan: 59 year old lady with a history of multiple colonic adenomas/Hemoccult positive stool.  Poor prep on colonoscopy December of last year.  Here for follow-up colonoscopy in the setting of a better preparation. 2.  3. The risks, benefits, limitations, alternatives and imponderables have been reviewed with the patient. Questions have been answered. All parties are agreeable.      Notice: This dictation was prepared with Dragon dictation along with smaller phrase technology. Any transcriptional errors that result from this process are unintentional and may not be corrected upon review.

## 2020-03-04 NOTE — Anesthesia Postprocedure Evaluation (Signed)
Anesthesia Post Note  Patient: Stacey Brooks  Procedure(s) Performed: COLONOSCOPY WITH PROPOFOL (N/A ) POLYPECTOMY  Patient location during evaluation: PACU Anesthesia Type: General Level of consciousness: awake and alert and oriented Pain management: pain level controlled Vital Signs Assessment: post-procedure vital signs reviewed and stable Respiratory status: spontaneous breathing Cardiovascular status: blood pressure returned to baseline and stable Postop Assessment: no apparent nausea or vomiting Anesthetic complications: no   No complications documented.   Last Vitals:  Vitals:   03/04/20 0950  BP: 122/73  Pulse: 85  Resp: 12  Temp: 36.7 C    Last Pain:  Vitals:   03/04/20 1003  TempSrc:   PainSc: 0-No pain                 Annagrace Carr

## 2020-03-04 NOTE — Discharge Instructions (Signed)
High-Fiber Diet Fiber, also called dietary fiber, is a type of carbohydrate that is found in fruits, vegetables, whole grains, and beans. A high-fiber diet can have many health benefits. Your health care provider may recommend a high-fiber diet to help:  Prevent constipation. Fiber can make your bowel movements more regular.  Lower your cholesterol.  Relieve the following conditions: ? Swelling of veins in the anus (hemorrhoids). ? Swelling and irritation (inflammation) of specific areas of the digestive tract (uncomplicated diverticulosis). ? A problem of the large intestine (colon) that sometimes causes pain and diarrhea (irritable bowel syndrome, IBS).  Prevent overeating as part of a weight-loss plan.  Prevent heart disease, type 2 diabetes, and certain cancers. What is my plan? The recommended daily fiber intake in grams (g) includes:  38 g for men age 50 or younger.  30 g for men over age 50.  25 g for women age 50 or younger.  21 g for women over age 50. You can get the recommended daily intake of dietary fiber by:  Eating a variety of fruits, vegetables, grains, and beans.  Taking a fiber supplement, if it is not possible to get enough fiber through your diet. What do I need to know about a high-fiber diet?  It is better to get fiber through food sources rather than from fiber supplements. There is not a lot of research about how effective supplements are.  Always check the fiber content on the nutrition facts label of any prepackaged food. Look for foods that contain 5 g of fiber or more per serving.  Talk with a diet and nutrition specialist (dietitian) if you have questions about specific foods that are recommended or not recommended for your medical condition, especially if those foods are not listed below.  Gradually increase how much fiber you consume. If you increase your intake of dietary fiber too quickly, you may have bloating, cramping, or gas.  Drink plenty  of water. Water helps you to digest fiber. What are tips for following this plan?  Eat a wide variety of high-fiber foods.  Make sure that half of the grains that you eat each day are whole grains.  Eat breads and cereals that are made with whole-grain flour instead of refined flour or white flour.  Eat brown rice, bulgur wheat, or millet instead of white rice.  Start the day with a breakfast that is high in fiber, such as a cereal that contains 5 g of fiber or more per serving.  Use beans in place of meat in soups, salads, and pasta dishes.  Eat high-fiber snacks, such as berries, raw vegetables, nuts, and popcorn.  Choose whole fruits and vegetables instead of processed forms like juice or sauce. What foods can I eat?  Fruits Berries. Pears. Apples. Oranges. Avocado. Prunes and raisins. Dried figs. Vegetables Sweet potatoes. Spinach. Kale. Artichokes. Cabbage. Broccoli. Cauliflower. Green peas. Carrots. Squash. Grains Whole-grain breads. Multigrain cereal. Oats and oatmeal. Brown rice. Barley. Bulgur wheat. Millet. Quinoa. Bran muffins. Popcorn. Rye wafer crackers. Meats and other proteins Navy, kidney, and pinto beans. Soybeans. Split peas. Lentils. Nuts and seeds. Dairy Fiber-fortified yogurt. Beverages Fiber-fortified soy milk. Fiber-fortified orange juice. Other foods Fiber bars. The items listed above may not be a complete list of recommended foods and beverages. Contact a dietitian for more options. What foods are not recommended? Fruits Fruit juice. Cooked, strained fruit. Vegetables Fried potatoes. Canned vegetables. Well-cooked vegetables. Grains White bread. Pasta made with refined flour. White rice. Meats and other   proteins Fatty cuts of meat. Fried chicken or fried fish. Dairy Milk. Yogurt. Cream cheese. Sour cream. Fats and oils Butters. Beverages Soft drinks. Other foods Cakes and pastries. The items listed above may not be a complete list of foods  and beverages to avoid. Contact a dietitian for more information. Summary  Fiber is a type of carbohydrate. It is found in fruits, vegetables, whole grains, and beans.  There are many health benefits of eating a high-fiber diet, such as preventing constipation, lowering blood cholesterol, helping with weight loss, and reducing your risk of heart disease, diabetes, and certain cancers.  Gradually increase your intake of fiber. Increasing too fast can result in cramping, bloating, and gas. Drink plenty of water while you increase your fiber.  The best sources of fiber include whole fruits and vegetables, whole grains, nuts, seeds, and beans. This information is not intended to replace advice given to you by your health care provider. Make sure you discuss any questions you have with your health care provider. Document Revised: 07/11/2017 Document Reviewed: 07/11/2017 Elsevier Patient Education  [place]. Diverticulosis  Diverticulosis is a condition that develops when small pouches (diverticula) form in the wall of the large intestine (colon). The colon is where water is absorbed and stool (feces) is formed. The pouches form when the inside layer of the colon pushes through weak spots in the outer layers of the colon. You may have a few pouches or many of them. The pouches usually do not cause problems unless they become inflamed or infected. When this happens, the condition is called diverticulitis. What are the causes? The cause of this condition is not known. What increases the risk? The following factors may make you more likely to develop this condition:  Being older than age 54. Your risk for this condition increases with age. Diverticulosis is rare among people younger than age 17. By age 61, many people have it.  Eating a low-fiber diet.  Having frequent constipation.  Being overweight.  Not getting enough exercise.  Smoking.  Taking over-the-counter pain medicines,  like aspirin and ibuprofen.  Having a family history of diverticulosis. What are the signs or symptoms? In most people, there are no symptoms of this condition. If you do have symptoms, they may include:  Bloating.  Cramps in the abdomen.  Constipation or diarrhea.  Pain in the lower left side of the abdomen. How is this diagnosed? Because diverticulosis usually has no symptoms, it is most often diagnosed during an exam for other colon problems. The condition may be diagnosed by:  Using a flexible scope to examine the colon (colonoscopy).  Taking an X-ray of the colon after dye has been put into the colon (barium enema).  Having a CT scan. How is this treated? You may not need treatment for this condition. Your health care provider may recommend treatment to prevent problems. You may need treatment if you have symptoms or if you previously had diverticulitis. Treatment may include:  Eating a high-fiber diet.  Taking a fiber supplement.  Taking a live bacteria supplement (probiotic).  Taking medicine to relax your colon. Follow these instructions at home: Medicines  Take over-the-counter and prescription medicines only as told by your health care provider.  If told by your health care provider, take a fiber supplement or probiotic. Constipation prevention Your condition may cause constipation. To prevent or treat constipation, you may need to:  Drink enough fluid to keep your urine pale yellow.  Take over-the-counter or prescription  medicines.  Eat foods that are high in fiber, such as beans, whole grains, and fresh fruits and vegetables.  Limit foods that are high in fat and processed sugars, such as fried or sweet foods.  General instructions  Try not to strain when you have a bowel movement.  Keep all follow-up visits as told by your health care provider. This is important. Contact a health care provider if you:  Have pain in your abdomen.  Have  bloating.  Have cramps.  Have not had a bowel movement in 3 days. Get help right away if:  Your pain gets worse.  Your bloating becomes very bad.  You have a fever or chills, and your symptoms suddenly get worse.  You vomit.  You have bowel movements that are bloody or black.  You have bleeding from your rectum. Summary  Diverticulosis is a condition that develops when small pouches (diverticula) form in the wall of the large intestine (colon).  You may have a few pouches or many of them.  This condition is most often diagnosed during an exam for other colon problems.  Treatment may include increasing the fiber in your diet, taking supplements, or taking medicines. This information is not intended to replace advice given to you by your health care provider. Make sure you discuss any questions you have with your health care provider. Document Revised: 04/05/2019 Document Reviewed: 04/05/2019 Elsevier Patient Education  [place]. Colon Polyps  Polyps are tissue growths inside the body. Polyps can grow in many places, including the large intestine (colon). A polyp may be a round bump or a mushroom-shaped growth. You could have one polyp or several. Most colon polyps are noncancerous (benign). However, some colon polyps can become cancerous over time. Finding and removing the polyps early can help prevent this. What are the causes? The exact cause of colon polyps is not known. What increases the risk? You are more likely to develop this condition if you:  Have a family history of colon cancer or colon polyps.  Are older than 7 or older than 45 if you are African American.  Have inflammatory bowel disease, such as ulcerative colitis or Crohn's disease.  Have certain hereditary conditions, such as: ? Familial adenomatous polyposis. ? Lynch syndrome. ? Turcot syndrome. ? Peutz-Jeghers syndrome.  Are overweight.  Smoke cigarettes.  Do not get enough  exercise.  Drink too much alcohol.  Eat a diet that is high in fat and red meat and low in fiber.  Had childhood cancer that was treated with abdominal radiation. What are the signs or symptoms? Most polyps do not cause symptoms. If you have symptoms, they may include:  Blood coming from your rectum when having a bowel movement.  Blood in your stool. The stool may look dark red or black.  Abdominal pain.  A change in bowel habits, such as constipation or diarrhea. How is this diagnosed? This condition is diagnosed with a colonoscopy. This is a procedure in which a lighted, flexible scope is inserted into the anus and then passed into the colon to examine the area. Polyps are sometimes found when a colonoscopy is done as part of routine cancer screening tests. How is this treated? Treatment for this condition involves removing any polyps that are found. Most polyps can be removed during a colonoscopy. Those polyps will then be tested for cancer. Additional treatment may be needed depending on the results of testing. Follow these instructions at home: Lifestyle  Maintain a healthy  weight, or lose weight if recommended by your health care provider.  Exercise every day or as told by your health care provider.  Do not use any products that contain nicotine or tobacco, such as cigarettes and e-cigarettes. If you need help quitting, ask your health care provider.  If you drink alcohol, limit how much you have: ? 0-1 drink a day for women. ? 0-2 drinks a day for men.  Be aware of how much alcohol is in your drink. In the [place] one drink equals one 12 oz bottle of beer (355 mL), one 5 oz glass of wine (148 mL), or one 1 oz shot of hard liquor (44 mL). Eating and drinking   Eat foods that are high in fiber, such as fruits, vegetables, and whole grains.  Eat foods that are high in calcium and vitamin D, such as milk, cheese, yogurt, eggs, liver, fish, and broccoli.  Limit foods that  are high in fat, such as fried foods and desserts.  Limit the amount of red meat and processed meat you eat, such as hot dogs, sausage, bacon, and lunch meats. General instructions  Keep all follow-up visits as told by your health care provider. This is important. ? This includes having regularly scheduled colonoscopies. ? Talk to your health care provider about when you need a colonoscopy. Contact a health care provider if:  You have new or worsening bleeding during a bowel movement.  You have new or increased blood in your stool.  You have a change in bowel habits.  You lose weight for no known reason. Summary  Polyps are tissue growths inside the body. Polyps can grow in many places, including the colon.  Most colon polyps are noncancerous (benign), but some can become cancerous over time.  This condition is diagnosed with a colonoscopy.  Treatment for this condition involves removing any polyps that are found. Most polyps can be removed during a colonoscopy. This information is not intended to replace advice given to you by your health care provider. Make sure you discuss any questions you have with your health care provider. Document Revised: 12/22/2017 Document Reviewed: 12/22/2017 Elsevier Patient Education  [place].  Colonoscopy Discharge Instructions  Read the instructions outlined below and refer to this sheet in the next few weeks. These discharge instructions provide you with general information on caring for yourself after you leave the hospital. Your doctor may also give you specific instructions. While your treatment has been planned according to the most current medical practices available, unavoidable complications occasionally occur. If you have any problems or questions after discharge, call Dr. Gala Romney at [phone]. ACTIVITY  You may resume your regular activity, but move at a slower pace for the next 24 hours.   Take frequent rest periods for the  next 24 hours.   Walking will help get rid of the air and reduce the bloated feeling in your belly (abdomen).   No driving for 24 hours (because of the medicine (anesthesia) used during the test).    Do not sign any important legal documents or operate any machinery for 24 hours (because of the anesthesia used during the test).  NUTRITION  Drink plenty of fluids.   You may resume your normal diet as instructed by your doctor.   Begin with a light meal and progress to your normal diet. Heavy or fried foods are harder to digest and may make you feel sick to your stomach (nauseated).   Avoid alcoholic beverages for 24  hours or as instructed.  MEDICATIONS  You may resume your normal medications unless your doctor tells you otherwise.  WHAT YOU CAN EXPECT TODAY  Some feelings of bloating in the abdomen.   Passage of more gas than usual.   Spotting of blood in your stool or on the toilet paper.  IF YOU HAD POLYPS REMOVED DURING THE COLONOSCOPY:  No aspirin products for 7 days or as instructed.   No alcohol for 7 days or as instructed.   Eat a soft diet for the next 24 hours.  FINDING OUT THE RESULTS OF YOUR TEST Not all test results are available during your visit. If your test results are not back during the visit, make an appointment with your caregiver to find out the results. Do not assume everything is normal if you have not heard from your caregiver or the medical facility. It is important for you to follow up on all of your test results.  SEEK IMMEDIATE MEDICAL ATTENTION IF:  You have more than a spotting of blood in your stool.   Your belly is swollen (abdominal distention).   You are nauseated or vomiting.   You have a temperature over 101.   You have abdominal pain or discomfort that is severe or gets worse throughout the day.    Colon polyp and diverticulosis information provided  Further recommendations to follow pending review of pathology report  At patient  request I called Milinda Pointer at [phone] and reviewed results.

## 2020-03-04 NOTE — Op Note (Signed)
[facility] Patient Name: Stacey Brooks Procedure Date: 03/04/2020 9:57 AM MRN: [id number] Date of Birth: 14-Oct-1960 Attending MD: Norvel Richards , MD CSN: [id number] Age: 59 Admit Type: Outpatient Procedure:                Colonoscopy Indications:              High risk colon cancer surveillance: Personal                            history of colonic polyps Providers:                Norvel Richards, MD, Crystal Page, Nelma Rothman,                            Technician Referring MD:              Medicines:                Propofol per Anesthesia Complications:            No immediate complications. Estimated Blood Loss:     Estimated blood loss was minimal. Procedure:                Pre-Anesthesia Assessment:                           - Prior to the procedure, a History and Physical                            was performed, and patient medications and                            allergies were reviewed. The patient's tolerance of                            previous anesthesia was also reviewed. The risks                            and benefits of the procedure and the sedation                            options and risks were discussed with the patient.                            All questions were answered, and informed consent                            was obtained. Prior Anticoagulants: The patient has                            taken no previous anticoagulant or antiplatelet                            agents. ASA Grade Assessment: II - A patient with  mild systemic disease. After reviewing the risks                            and benefits, the patient was deemed in                            satisfactory condition to undergo the procedure.                           After obtaining informed consent, the colonoscope                            was passed under direct vision. Throughout the                            procedure, the patient's  blood pressure, pulse, and                            oxygen saturations were monitored continuously. The                            [id number]-[id number] ([id number]) scope was introduced through                            the anus and advanced to the the cecum, identified                            by appendiceal orifice and ileocecal valve. The                            colonoscopy was performed without difficulty. The                            patient tolerated the procedure well. The quality                            of the bowel preparation was adequate. Scope In: 10:07:58 AM Scope Out: 10:25:18 AM Scope Withdrawal Time: 0 hours 12 minutes 36 seconds  Total Procedure Duration: 0 hours 17 minutes 20 seconds  Findings:      The perianal and digital rectal examinations were normal.      Multiple medium-mouthed diverticula were found in the entire colon.      Five sessile polyps were found in the sigmoid colon, splenic flexure and       hepatic flexure. The polyps were 4 to 6 mm in size. These polyps were       removed with a cold snare. Resection and retrieval were complete.       Estimated blood loss was minimal.      The exam was otherwise without abnormality on direct and retroflexion       views. Impression:               - Diverticulosis in the entire examined colon.                           -  Five 4 to 6 mm polyps in the sigmoid colon, at                            the splenic flexure and at the hepatic flexure,                            removed with a cold snare. Resected and retrieved.                           - The examination was otherwise normal on direct                            and retroflexion views. Moderate Sedation:      Moderate (conscious) sedation was personally administered by an       anesthesia professional. The following parameters were monitored: oxygen       saturation, heart rate, blood pressure, respiratory rate, EKG, adequacy       of pulmonary ventilation, and  response to care. Recommendation:           - Patient has a contact number available for                            emergencies. The signs and symptoms of potential                            delayed complications were discussed with the                            patient. Return to normal activities tomorrow.                            Written discharge instructions were provided to the                            patient.                           - Resume previous diet.                           - Continue present medications.                           - Repeat colonoscopy date to be determined after                            pending pathology results are reviewed for                            surveillance.                           - Return to GI office in 6 months. Procedure Code(s):        --- Professional ---  45385, Colonoscopy, flexible; with removal of                            tumor(s), polyp(s), or other lesion(s) by snare                            technique Diagnosis Code(s):        --- Professional ---                           Z86.010, Personal history of colonic polyps                           K63.5, Polyp of colon                           K57.30, Diverticulosis of large intestine without                            perforation or abscess without bleeding CPT copyright 2019 American Medical Association. All rights reserved. The codes documented in this report are preliminary and upon coder review may  be revised to meet current compliance requirements. Cristopher Estimable. [place], MD Norvel Richards, MD 03/04/2020 10:39:21 AM This report has been signed electronically. Number of Addenda: 0

## 2020-03-04 NOTE — Transfer of Care (Signed)
Immediate Anesthesia Transfer of Care Note  Patient: Stacey Brooks  Procedure(s) Performed: COLONOSCOPY WITH PROPOFOL (N/A ) POLYPECTOMY  Patient Location: PACU  Anesthesia Type:General  Level of Consciousness: awake  Airway & Oxygen Therapy: Patient Spontanous Breathing  Post-op Assessment: Report given to RN  Post vital signs: Reviewed  Last Vitals:  Vitals Value Taken Time  BP 96/54 03/04/20 1031  Temp    Pulse 82 03/04/20 1032  Resp 14 03/04/20 1032  SpO2 97 % 03/04/20 1032  Vitals shown include unvalidated device data.  Last Pain:  Vitals:   03/04/20 1003  TempSrc:   PainSc: 0-No pain      Patients Stated Pain Goal: 1 (70/05/25 9102)  Complications: No complications documented.

## 2020-03-05 ENCOUNTER — Encounter: Payer: Self-pay | Admitting: Internal Medicine

## 2020-03-05 LAB — SURGICAL PATHOLOGY

## 2020-03-06 ENCOUNTER — Encounter (HOSPITAL_COMMUNITY): Payer: Self-pay | Admitting: Internal Medicine

## 2020-03-17 ENCOUNTER — Other Ambulatory Visit: Payer: Self-pay

## 2020-03-17 ENCOUNTER — Encounter: Payer: Self-pay | Admitting: Advanced Practice Midwife

## 2020-03-17 ENCOUNTER — Ambulatory Visit (INDEPENDENT_AMBULATORY_CARE_PROVIDER_SITE_OTHER): Payer: Medicare Other | Admitting: Advanced Practice Midwife

## 2020-03-17 VITALS — BP 104/67 | HR 81 | Ht 63.0 in | Wt 193.4 lb

## 2020-03-17 DIAGNOSIS — B3731 Acute candidiasis of vulva and vagina: Secondary | ICD-10-CM

## 2020-03-17 DIAGNOSIS — B373 Candidiasis of vulva and vagina: Secondary | ICD-10-CM | POA: Diagnosis not present

## 2020-03-17 MED ORDER — FLUCONAZOLE 100 MG PO TABS
100.0000 mg | ORAL_TABLET | Freq: Every day | ORAL | 0 refills | Status: DC
Start: 2020-03-17 — End: 2020-04-03

## 2020-03-17 NOTE — Progress Notes (Signed)
[facility] Visit  Patient name: Stacey Brooks MRN [id number]  Date of birth: [birth year]  CC & HPI:  Stacey Brooks is a 59 y.o. Caucasian female presenting today for an itchy rash (diagnosed as yeast about 6 months ago).  Treated w/gentian violet, several creams, oral diflucan.  Goes away for a very short time and comes right back.   Pertinent History Reviewed:  Medical & Surgical Hx:   Past Medical History:  Diagnosis Date  . ANA positive   . Anxiety   . Asthma    Intermittent; asthmatic bronchitis  . Back pain 06/25/2011  . Cancer ([place])    skin cancer L arm  . Carpal tunnel syndrome on right 10/11/2008  . Cervical nerve root impingement 10/03/2012  . Cervical spondylolysis 10/03/2012  . Chronic back pain   . Colon polyps 08/25/2012  . Constipation   . COPD (chronic obstructive pulmonary disease) ([place]) 06/25/2011  . De Quervain's disease (tenosynovitis) 07/02/2013  . Depression 06/25/2011  . Diverticulosis 08/25/2012  . Edema 07/03/2003  . Excessive hair 08/22/2012   body and facial   . Fatty liver 06/10/2015  . Fibromyalgia 06/30/2009  . GERD (gastroesophageal reflux disease) 07/03/2003  . H/O bladder repair surgery 02/23/2013  . H/O neck surgery 10/03/2012  . Hemorrhoids 07/01/2014  . Hirsutism   . Hypercholesterolemia 07/05/2003   Mild  . Hyperlipidemia   . Hypertension   . IBS (irritable bowel syndrome) 2010  . Incontinence of urine in female 09/09/2003  . Insomnia 2010  . Lumbar degenerative disc disease   . Migraine 08/17/2019  . Neural foraminal stenosis of cervical spine   . Obesity 08/23/2013  . Obstructive chronic bronchitis ([place])   . Osteoarthritis 06/25/2011  . Pilar cyst 07/30/2011  . Polymyalgia ([place]) 03/31/2009  . SS-A antibody positive 08/22/2012  . Suicide attempt ([place]) 06/25/2011  . Thyroid disease    Hypothyroidism  . Tobacco abuse 07/03/2003  . Trigger middle finger 05/30/2008   Past Surgical History:  Procedure Laterality Date  .  ABDOMINAL HYSTERECTOMY  04/04/2013  . BACK SURGERY  2007  . CARPECTOMY WITH RADIAL STYLOIDECTOMY  01/05/2012   DeQuervain's Release   . CERVICAL FUSION  2007  . COLONOSCOPY WITH PROPOFOL N/A 09/12/2019   Procedure: COLONOSCOPY WITH PROPOFOL;  Surgeon: Daneil Dolin, MD;  [facility];  Service: Endoscopy;  Laterality: N/A;  7:30am  . COLONOSCOPY WITH PROPOFOL N/A 03/04/2020   Procedure: COLONOSCOPY WITH PROPOFOL;  Surgeon: Daneil Dolin, MD;  [facility];  Service: Endoscopy;  Laterality: N/A;  1:00pm  . POLYPECTOMY  09/12/2019   Procedure: POLYPECTOMY;  Surgeon: Daneil Dolin, MD;  [facility];  Service: Endoscopy;;  hepatic flexure x5; cecal; descending  . POLYPECTOMY  03/04/2020   Procedure: POLYPECTOMY;  Surgeon: Daneil Dolin, MD;  [facility];  Service: Endoscopy;;  . WRIST ARTHROSCOPY  01/05/2012   release of carpal ligament   Family History  Problem Relation Age of Onset  . Emphysema Mother   . Hypertension Father   . Cancer Sister        Ovarian  . Cancer Maternal Grandmother   . Cancer Maternal Grandfather   . Colon cancer Neg Hx     Current Outpatient Medications:  .  albuterol (PROVENTIL HFA;VENTOLIN HFA) 108 (90 Base) MCG/ACT inhaler, Inhale 2 puffs into the lungs every 6 (six) hours as needed for wheezing or shortness of breath., Disp: , Rfl:  .  amLODipine (NORVASC) 5 MG tablet, Take 5 mg by mouth daily. , Disp: , Rfl:  .  cholecalciferol (VITAMIN D3) 25 MCG (1000 UT) tablet, Take 1,000 Units by mouth daily., Disp: , Rfl:  .  escitalopram (LEXAPRO) 5 MG tablet, Take 5 mg by mouth daily., Disp: , Rfl:  .  hydrALAZINE (APRESOLINE) 25 MG tablet, Take 25 mg by mouth 2 (two) times daily., Disp: , Rfl:  .  hydrochlorothiazide (HYDRODIURIL) 25 MG tablet, Take 25 mg by mouth daily., Disp: , Rfl:  .  lisinopril (ZESTRIL) 40 MG tablet, Take 40 mg by mouth daily. , Disp: , Rfl:  .  lovastatin (MEVACOR) 20 MG tablet, Take 20 mg  by mouth at bedtime. , Disp: , Rfl:  .  omeprazole (PRILOSEC) 20 MG capsule, Take 1 capsule (20 mg total) by mouth 2 (two) times daily before a meal. (Patient taking differently: Take 20 mg by mouth 2 (two) times daily. ), Disp: 60 capsule, Rfl: 3 .  dicyclomine (BENTYL) 10 MG capsule, Take 1 capsule (10 mg total) by mouth 4 (four) times daily -  before meals and at bedtime. Hold for constipation (Patient not taking: Reported on 03/17/2020), Disp: 120 capsule, Rfl: 1 .  fluconazole (DIFLUCAN) 100 MG tablet, Take 1 tablet (100 mg total) by mouth daily., Disp: 14 tablet, Rfl: 0 .  linaclotide (LINZESS) 145 MCG CAPS capsule, Take 1 capsule (145 mcg total) by mouth daily before breakfast. (Patient not taking: Reported on 03/17/2020), Disp: 30 capsule, Rfl: 3 .  oxybutynin (DITROPAN-XL) 10 MG 24 hr tablet, Take 10 mg by mouth daily., Disp: , Rfl:  .  polyethylene glycol-electrolytes (TRILYTE) 420 g solution, Take 4,000 mLs by mouth as directed., Disp: 4000 mL, Rfl: 0 .  polyethylene glycol-electrolytes (TRILYTE) 420 g solution, Take 4,000 mLs by mouth as directed., Disp: 4000 mL, Rfl: 0 .  potassium chloride SA (KLOR-CON) 20 MEQ tablet, Take 2 tablets (40 mEq total) by mouth daily for 2 days., Disp: 4 tablet, Rfl: 0 Social History: Reviewed -  reports that she has been smoking cigarettes. She has a 8.75 pack-year smoking history. She has never used smokeless tobacco.  Review of Systems:   Constitutional: Negative for fever and chills Eyes: Negative for visual disturbances Respiratory: Negative for shortness of breath, dyspnea Cardiovascular: Negative for chest pain or palpitations  Gastrointestinal: Negative for vomiting, diarrhea and constipation; no abdominal pain Genitourinary: Negative for dysuria: STRESS/URGE INCONTINECE Musculoskeletal: Negative for back pain, joint pain, myalgias  Neurological: Negative for dizziness and headaches    Objective Findings:    Physical Examination: Vitals:    03/17/20 1456  BP: 104/67  Pulse: 81   General appearance - well appearing, and in no distress Mental status - alert, oriented to person, place, and time Chest:  Normal respiratory effort Heart - normal rate and regular rhythm Abdomen:  Soft, nontender Pelvic: co exam w/LHE. No real rash to see.  Itches all over vulva/mons.  Painted w/gentian violet.   Musculoskeletal:  Normal range of motion without pain Extremities:  No edema    No results found for this or any previous visit (from the past 24 hour(s)).    Assessment & Plan:  A:   Yeast (?) P:diflucan 100mg  daily for 2 weeks (let us know if she has muscle pain).  Orders Placed This Encounter  Procedures  . Hemoglobin A1c    Return in about 2 weeks (around 03/31/2020) for with [facility]. ([place]) Christin Fudge CNM 03/17/2020 4:30  PM

## 2020-03-18 LAB — HEMOGLOBIN A1C
Est. average glucose Bld gHb Est-mCnc: 114 mg/dL
Hgb A1c MFr Bld: 5.6 % (ref 4.8–5.6)

## 2020-03-27 ENCOUNTER — Telehealth: Payer: Self-pay | Admitting: Advanced Practice Midwife

## 2020-03-27 NOTE — Telephone Encounter (Signed)
I called her

## 2020-03-27 NOTE — Telephone Encounter (Signed)
Patient called stating that she had A1C blood work done not to long ago and would like for a nurse to give her a call back with the results. Please contact pt

## 2020-04-03 ENCOUNTER — Ambulatory Visit (INDEPENDENT_AMBULATORY_CARE_PROVIDER_SITE_OTHER): Payer: Medicare Other | Admitting: Obstetrics & Gynecology

## 2020-04-03 ENCOUNTER — Encounter: Payer: Self-pay | Admitting: Obstetrics & Gynecology

## 2020-04-03 VITALS — BP 105/65 | HR 80 | Ht 63.0 in | Wt 192.2 lb

## 2020-04-03 DIAGNOSIS — N3941 Urge incontinence: Secondary | ICD-10-CM

## 2020-04-03 DIAGNOSIS — B373 Candidiasis of vulva and vagina: Secondary | ICD-10-CM | POA: Diagnosis not present

## 2020-04-03 DIAGNOSIS — B3731 Acute candidiasis of vulva and vagina: Secondary | ICD-10-CM

## 2020-04-03 MED ORDER — FLUCONAZOLE 100 MG PO TABS: 100.0000 mg | ORAL_TABLET | Freq: Every day | ORAL | 0 refills | Status: DC

## 2020-04-03 MED ORDER — OXYBUTYNIN CHLORIDE ER 10 MG PO TB24: 10.0000 mg | ORAL_TABLET | Freq: Every day | ORAL | 11 refills | Status: DC

## 2020-04-03 NOTE — Progress Notes (Signed)
Follow up appointment for results  Chief Complaint  Patient presents with  . 2 Week Follow-up    had yeast infection/ talk about bladder surgery    Blood pressure 105/65, pulse 80, height 5\' 3"  (1.6 m), weight 192 lb 3.2 oz (87.2 kg).      MEDS ordered this encounter: Meds ordered this encounter  Medications  . oxybutynin (DITROPAN-XL) 10 MG 24 hr tablet    Sig: Take 1 tablet (10 mg total) by mouth daily.    Dispense:  30 tablet    Refill:  11  . DISCONTD: fluconazole (DIFLUCAN) 100 MG tablet    Sig: Take 1 tablet (100 mg total) by mouth daily.    Dispense:  14 tablet    Refill:  0    Orders for this encounter: No orders of the defined types were placed in this encounter.   Impression:   ICD-10-CM   1. Vulvovaginal candidiasis, pad moisture related  B37.3   2. Urge incontinence  N39.41    significantly improved on Ditropan xl 10 at bedtime     Plan: 2-week trial of Diflucan daily Began Ditropan XL therapy  Follow Up: Return in about 3 months (around 07/04/2020) for Follow up, with Dr Elonda Husky.       Face to face time:  10 minutes  Greater than 50% of the visit time was spent in counseling and coordination of care with the patient.  The summary and outline of the counseling and care coordination is summarized in the note above.   All questions were answered.  Past Medical History:  Diagnosis Date  . ANA positive   . Anxiety   . Asthma    Intermittent; asthmatic bronchitis  . Cancer ([place])    skin cancer L arm  . Carpal tunnel syndrome on right 10/11/2008  . Cervical nerve root impingement 10/03/2012  . Cervical spondylolysis 10/03/2012  . Chronic back pain   . Colon polyps 08/25/2012  . Constipation   . COPD (chronic obstructive pulmonary disease) ([place]) 06/25/2011  . De Quervain's disease (tenosynovitis) 07/02/2013  . Depression 06/25/2011  . Diverticulosis 08/25/2012  . Fatty liver 06/10/2015  . Fibromyalgia 06/30/2009  . GERD (gastroesophageal reflux  disease) 07/03/2003  . Hemorrhoids 07/01/2014  . Hirsutism   . Hypercholesterolemia 07/05/2003   Mild  . Hyperlipidemia   . Hypertension   . IBS (irritable bowel syndrome) 2010  . Insomnia 2010  . Lumbar degenerative disc disease   . Migraine 08/17/2019  . Neural foraminal stenosis of cervical spine   . Obesity 08/23/2013  . Obstructive chronic bronchitis ([place])   . Osteoarthritis 06/25/2011  . Pilar cyst 07/30/2011  . Polymyalgia ([place]) 03/31/2009  . SS-A antibody positive 08/22/2012  . Suicide attempt ([place]) 06/25/2011  . Thyroid disease    Hypothyroidism  . Tobacco abuse 07/03/2003  . Trigger middle finger 05/30/2008    Past Surgical History:  Procedure Laterality Date  . ABDOMINAL HYSTERECTOMY  04/04/2013  . BACK SURGERY  2007  . CARPECTOMY WITH RADIAL STYLOIDECTOMY  01/05/2012   DeQuervain's Release   . CERVICAL FUSION  2007  . COLONOSCOPY WITH PROPOFOL N/A 09/12/2019   Procedure: COLONOSCOPY WITH PROPOFOL;  Surgeon: Daneil Dolin, MD;  [facility];  Service: Endoscopy;  Laterality: N/A;  7:30am  . COLONOSCOPY WITH PROPOFOL N/A 03/04/2020   Procedure: COLONOSCOPY WITH PROPOFOL;  Surgeon: Daneil Dolin, MD;  [facility];  Service: Endoscopy;  Laterality: N/A;  1:00pm  . POLYPECTOMY  09/12/2019  Procedure: POLYPECTOMY;  Surgeon: Daneil Dolin, MD;  [facility];  Service: Endoscopy;;  hepatic flexure x5; cecal; descending  . POLYPECTOMY  03/04/2020   Procedure: POLYPECTOMY;  Surgeon: Daneil Dolin, MD;  [facility];  Service: Endoscopy;;  . WRIST ARTHROSCOPY  01/05/2012   release of carpal ligament    OB History    Gravida  3   Para  3   Term  3   Preterm      AB      Living  3     SAB      IAB      Ectopic      Multiple      Live Births  3           Allergies  Allergen Reactions  . Clindamycin/Lincomycin Rash  . Erythromycin Itching and Rash    Social History   Socioeconomic History  .  Marital status: Widowed    Spouse name: Not on file  . Number of children: 3  . Years of education: Not on file  . Highest education level: Not on file  Occupational History  . Not on file  Tobacco Use  . Smoking status: Current Every Day Smoker    Packs/day: 0.25    Years: 35.00    Pack years: 8.75    Types: Cigarettes  . Smokeless tobacco: Never Used  . Tobacco comment: has used cocaine in the past, over 10 years  Vaping Use  . Vaping Use: Never used  Substance and Sexual Activity  . Alcohol use: No    Alcohol/week: 0.0 standard drinks  . Drug use: Not Currently    Comment: has used cocaine  . Sexual activity: Not Currently    Birth control/protection: Surgical    Comment: hyst  Other Topics Concern  . Not on file  Social History Narrative   Widowed.  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       Social Determinants of Health   Financial Resource Strain: Medium Risk  . Difficulty of Paying Living Expenses: Somewhat hard  Food Insecurity: Food Insecurity Present  . Worried About Charity fundraiser in the Last Year: Sometimes true  . Ran Out of Food in the Last Year: Sometimes true  Transportation Needs: No Transportation Needs  . Lack of Transportation (Medical): No  . Lack of Transportation (Non-Medical): No  Physical Activity: Insufficiently Active  . Days of Exercise per Week: 2 days  . Minutes of Exercise per Session: 20 min  Stress: Stress Concern Present  . Feeling of Stress : Very much  Social Connections: Socially Isolated  . Frequency of Communication with Friends and Family: More than three times a week  . Frequency of Social Gatherings with Friends and Family: Once a week  . Attends Religious Services: Never  . Active Member of Clubs or Organizations: No  . Attends Archivist Meetings: Never  . Marital Status: Widowed    Family History  Problem Relation Age of Onset  . Emphysema Mother   . Hypertension Father   . Stroke Father   . Cancer Maternal  Grandmother   . Cancer Maternal Grandfather   . Ovarian cancer Sister   . Colon cancer Neg Hx

## 2020-04-19 DIAGNOSIS — F5101 Primary insomnia: Secondary | ICD-10-CM | POA: Diagnosis not present

## 2020-04-19 DIAGNOSIS — G894 Chronic pain syndrome: Secondary | ICD-10-CM | POA: Diagnosis not present

## 2020-04-19 DIAGNOSIS — R7301 Impaired fasting glucose: Secondary | ICD-10-CM | POA: Diagnosis not present

## 2020-05-13 DIAGNOSIS — I10 Essential (primary) hypertension: Secondary | ICD-10-CM | POA: Diagnosis not present

## 2020-05-13 DIAGNOSIS — E782 Mixed hyperlipidemia: Secondary | ICD-10-CM | POA: Diagnosis not present

## 2020-05-22 DIAGNOSIS — M25519 Pain in unspecified shoulder: Secondary | ICD-10-CM | POA: Diagnosis not present

## 2020-05-22 DIAGNOSIS — M545 Low back pain: Secondary | ICD-10-CM | POA: Diagnosis not present

## 2020-05-22 DIAGNOSIS — G894 Chronic pain syndrome: Secondary | ICD-10-CM | POA: Diagnosis not present

## 2020-05-22 DIAGNOSIS — M542 Cervicalgia: Secondary | ICD-10-CM | POA: Diagnosis not present

## 2020-05-22 DIAGNOSIS — Z79899 Other long term (current) drug therapy: Secondary | ICD-10-CM | POA: Diagnosis not present

## 2020-06-04 ENCOUNTER — Other Ambulatory Visit: Payer: Self-pay | Admitting: Obstetrics & Gynecology

## 2020-06-18 DIAGNOSIS — M25519 Pain in unspecified shoulder: Secondary | ICD-10-CM | POA: Diagnosis not present

## 2020-06-18 DIAGNOSIS — Z79899 Other long term (current) drug therapy: Secondary | ICD-10-CM | POA: Diagnosis not present

## 2020-06-18 DIAGNOSIS — Z79891 Long term (current) use of opiate analgesic: Secondary | ICD-10-CM | POA: Diagnosis not present

## 2020-06-18 DIAGNOSIS — M545 Low back pain: Secondary | ICD-10-CM | POA: Diagnosis not present

## 2020-06-18 DIAGNOSIS — M542 Cervicalgia: Secondary | ICD-10-CM | POA: Diagnosis not present

## 2020-06-24 DIAGNOSIS — I10 Essential (primary) hypertension: Secondary | ICD-10-CM | POA: Diagnosis not present

## 2020-06-24 DIAGNOSIS — R609 Edema, unspecified: Secondary | ICD-10-CM | POA: Diagnosis not present

## 2020-07-04 ENCOUNTER — Ambulatory Visit: Payer: Medicare Other | Admitting: Obstetrics & Gynecology

## 2020-07-07 DIAGNOSIS — I700 Atherosclerosis of aorta: Secondary | ICD-10-CM | POA: Diagnosis not present

## 2020-07-07 DIAGNOSIS — I10 Essential (primary) hypertension: Secondary | ICD-10-CM | POA: Diagnosis not present

## 2020-07-07 DIAGNOSIS — Z79899 Other long term (current) drug therapy: Secondary | ICD-10-CM | POA: Diagnosis not present

## 2020-07-07 DIAGNOSIS — S2020XA Contusion of thorax, unspecified, initial encounter: Secondary | ICD-10-CM | POA: Diagnosis not present

## 2020-07-07 DIAGNOSIS — Z881 Allergy status to other antibiotic agents status: Secondary | ICD-10-CM | POA: Diagnosis not present

## 2020-07-07 DIAGNOSIS — S8002XA Contusion of left knee, initial encounter: Secondary | ICD-10-CM | POA: Diagnosis not present

## 2020-07-07 DIAGNOSIS — I6782 Cerebral ischemia: Secondary | ICD-10-CM | POA: Diagnosis not present

## 2020-07-07 DIAGNOSIS — R1032 Left lower quadrant pain: Secondary | ICD-10-CM | POA: Diagnosis not present

## 2020-07-07 DIAGNOSIS — F17210 Nicotine dependence, cigarettes, uncomplicated: Secondary | ICD-10-CM | POA: Diagnosis not present

## 2020-07-11 ENCOUNTER — Ambulatory Visit: Payer: Medicare Other | Admitting: Cardiovascular Disease

## 2020-07-16 DIAGNOSIS — E782 Mixed hyperlipidemia: Secondary | ICD-10-CM | POA: Diagnosis not present

## 2020-07-16 DIAGNOSIS — I10 Essential (primary) hypertension: Secondary | ICD-10-CM | POA: Diagnosis not present

## 2020-07-18 ENCOUNTER — Encounter: Payer: Self-pay | Admitting: Obstetrics & Gynecology

## 2020-07-18 ENCOUNTER — Ambulatory Visit (INDEPENDENT_AMBULATORY_CARE_PROVIDER_SITE_OTHER): Payer: Medicare Other | Admitting: Obstetrics & Gynecology

## 2020-07-18 VITALS — BP 108/73 | HR 80 | Ht 63.0 in | Wt 205.0 lb

## 2020-07-18 DIAGNOSIS — N3941 Urge incontinence: Secondary | ICD-10-CM | POA: Diagnosis not present

## 2020-07-18 DIAGNOSIS — L900 Lichen sclerosus et atrophicus: Secondary | ICD-10-CM

## 2020-07-18 MED ORDER — CLOBETASOL PROPIONATE 0.05 % EX CREA: 1.0000 "application " | TOPICAL_CREAM | Freq: Two times a day (BID) | CUTANEOUS | 11 refills | Status: DC

## 2020-07-18 NOTE — Progress Notes (Signed)
Chief Complaint  Patient presents with  . Follow-up      59 y.o. [id number] No LMP recorded. Patient has had a hysterectomy. The current method of family planning is status post hysterectomy.  Outpatient Encounter Medications as of 07/18/2020  Medication Sig  . albuterol (PROVENTIL HFA;VENTOLIN HFA) 108 (90 Base) MCG/ACT inhaler Inhale 2 puffs into the lungs every 6 (six) hours as needed for wheezing or shortness of breath.  Marland Kitchen amLODipine (NORVASC) 5 MG tablet Take 5 mg by mouth daily.   . baclofen (LIORESAL) 10 MG tablet baclofen 10 mg tablet  Take 1 tablet 3 times a day by oral route.  . cholecalciferol (VITAMIN D3) 25 MCG (1000 UT) tablet Take 1,000 Units by mouth daily.  Marland Kitchen dicyclomine (BENTYL) 10 MG capsule Take 1 capsule (10 mg total) by mouth 4 (four) times daily -  before meals and at bedtime. Hold for constipation  . escitalopram (LEXAPRO) 5 MG tablet Take 5 mg by mouth daily.  Marland Kitchen gabapentin (NEURONTIN) 600 MG tablet gabapentin 600 mg tablet  Take 1 tablet 4 times a day by oral route.  . hydrALAZINE (APRESOLINE) 25 MG tablet Take 25 mg by mouth 2 (two) times daily.  . hydrochlorothiazide (HYDRODIURIL) 25 MG tablet Take 25 mg by mouth daily.  Marland Kitchen linaclotide (LINZESS) 145 MCG CAPS capsule Take 1 capsule (145 mcg total) by mouth daily before breakfast. (Patient taking differently: Take 145 mcg by mouth. )  . lisinopril (ZESTRIL) 40 MG tablet Take 40 mg by mouth daily.   Marland Kitchen lovastatin (MEVACOR) 20 MG tablet Take 20 mg by mouth at bedtime.   . nortriptyline (PAMELOR) 25 MG capsule nortriptyline 25 mg capsule  Take 2 capsules every day by oral route at bedtime.  1 at bedtime x 2 weeks then 2 at bedtime as tolerated  . omeprazole (PRILOSEC) 20 MG capsule Take 1 capsule (20 mg total) by mouth 2 (two) times daily before a meal. (Patient taking differently: Take 20 mg by mouth 2 (two) times daily. )  . oxybutynin (DITROPAN-XL) 10 MG 24 hr tablet Take 1 tablet (10 mg total) by mouth  daily.  . potassium chloride SA (KLOR-CON) 20 MEQ tablet potassium chloride ER 20 mEq tablet,extended release(part/cryst)  . clobetasol cream (TEMOVATE) 2.84 % Apply 1 application topically 2 (two) times daily.  . [DISCONTINUED] fluconazole (DIFLUCAN) 100 MG tablet TAKE 1 TABLET ONCE DAILY.  . [DISCONTINUED] polyethylene glycol-electrolytes (TRILYTE) 420 g solution Take 4,000 mLs by mouth as directed. (Patient not taking: Reported on 04/03/2020)  . [DISCONTINUED] polyethylene glycol-electrolytes (TRILYTE) 420 g solution Take 4,000 mLs by mouth as directed. (Patient not taking: Reported on 04/03/2020)  . [DISCONTINUED] potassium chloride SA (KLOR-CON) 20 MEQ tablet Take 2 tablets (40 mEq total) by mouth daily for 2 days.   No facility-administered encounter medications on file as of 07/18/2020.    Subjective Pt was seen in July for itching and a whitish to yellowish discharge At that time she had erythema consistent with a yeast vulvitis minimal yeast vaginitis I placed her on a extended course of Diflucan which resolved her symptoms In addition I diagnosed her with mixed incontinence urge predominant and placed her on Ditropan XL 10 at night That has improved her urinary frequency tremendously She is a lifelong significantly heavy smoker and she also has stress incontinence and wears a pad all the time She comes in today complaining still of external itching moderate sometimes severe Minimal discharge Symptoms are all external Past Medical  History:  Diagnosis Date  . ANA positive   . Anxiety   . Asthma    Intermittent; asthmatic bronchitis  . Back pain 06/25/2011  . Cancer ([place])    skin cancer L arm  . Carpal tunnel syndrome on right 10/11/2008  . Cervical nerve root impingement 10/03/2012  . Cervical spondylolysis 10/03/2012  . Chronic back pain   . Colon polyps 08/25/2012  . Constipation   . COPD (chronic obstructive pulmonary disease) ([place]) 06/25/2011  . De Quervain's disease  (tenosynovitis) 07/02/2013  . Depression 06/25/2011  . Diverticulosis 08/25/2012  . Edema 07/03/2003  . Excessive hair 08/22/2012   body and facial   . Fatty liver 06/10/2015  . Fibromyalgia 06/30/2009  . GERD (gastroesophageal reflux disease) 07/03/2003  . H/O bladder repair surgery 02/23/2013  . H/O neck surgery 10/03/2012  . Hemorrhoids 07/01/2014  . Hirsutism   . Hypercholesterolemia 07/05/2003   Mild  . Hyperlipidemia   . Hypertension   . IBS (irritable bowel syndrome) 2010  . Incontinence of urine in female 09/09/2003  . Insomnia 2010  . Lumbar degenerative disc disease   . Migraine 08/17/2019  . Neural foraminal stenosis of cervical spine   . Obesity 08/23/2013  . Obstructive chronic bronchitis ([place])   . Osteoarthritis 06/25/2011  . Pilar cyst 07/30/2011  . Polymyalgia ([place]) 03/31/2009  . SS-A antibody positive 08/22/2012  . Suicide attempt ([place]) 06/25/2011  . Thyroid disease    Hypothyroidism  . Tobacco abuse 07/03/2003  . Trigger middle finger 05/30/2008    Past Surgical History:  Procedure Laterality Date  . ABDOMINAL HYSTERECTOMY  04/04/2013  . BACK SURGERY  2007  . CARPECTOMY WITH RADIAL STYLOIDECTOMY  01/05/2012   DeQuervain's Release   . CERVICAL FUSION  2007  . COLONOSCOPY WITH PROPOFOL N/A 09/12/2019   Procedure: COLONOSCOPY WITH PROPOFOL;  Surgeon: Daneil Dolin, MD;  [facility];  Service: Endoscopy;  Laterality: N/A;  7:30am  . COLONOSCOPY WITH PROPOFOL N/A 03/04/2020   Procedure: COLONOSCOPY WITH PROPOFOL;  Surgeon: Daneil Dolin, MD;  [facility];  Service: Endoscopy;  Laterality: N/A;  1:00pm  . POLYPECTOMY  09/12/2019   Procedure: POLYPECTOMY;  Surgeon: Daneil Dolin, MD;  [facility];  Service: Endoscopy;;  hepatic flexure x5; cecal; descending  . POLYPECTOMY  03/04/2020   Procedure: POLYPECTOMY;  Surgeon: Daneil Dolin, MD;  [facility];  Service: Endoscopy;;  . WRIST ARTHROSCOPY  01/05/2012   release of  carpal ligament    OB History    Gravida  3   Para  3   Term  3   Preterm      AB      Living  3     SAB      TAB      Ectopic      Multiple      Live Births  3           Allergies  Allergen Reactions  . Clindamycin/Lincomycin Rash  . Erythromycin Itching and Rash    Social History   Socioeconomic History  . Marital status: Divorced    Spouse name: Not on file  . Number of children: 3  . Years of education: Not on file  . Highest education level: Not on file  Occupational History  . Not on file  Tobacco Use  . Smoking status: Current Every Day Smoker    Packs/day: 0.25    Years: 35.00  Pack years: 8.75    Types: Cigarettes  . Smokeless tobacco: Never Used  . Tobacco comment: has used cocaine in the past, over 10 years  Vaping Use  . Vaping Use: Never used  Substance and Sexual Activity  . Alcohol use: No    Alcohol/week: 0.0 standard drinks  . Drug use: Not Currently    Comment: has used cocaine  . Sexual activity: Not Currently    Birth control/protection: Surgical    Comment: hyst  Other Topics Concern  . Not on file  Social History Narrative   Widowed   Unemployed   Social Determinants of Health   Financial Resource Strain: Medium Risk  . Difficulty of Paying Living Expenses: Somewhat hard  Food Insecurity: Food Insecurity Present  . Worried About Charity fundraiser in the Last Year: Sometimes true  . Ran Out of Food in the Last Year: Sometimes true  Transportation Needs: No Transportation Needs  . Lack of Transportation (Medical): No  . Lack of Transportation (Non-Medical): No  Physical Activity: Insufficiently Active  . Days of Exercise per Week: 2 days  . Minutes of Exercise per Session: 20 min  Stress: Stress Concern Present  . Feeling of Stress : Very much  Social Connections: Socially Isolated  . Frequency of Communication with Friends and Family: More than three times a week  . Frequency of Social Gatherings with  Friends and Family: Once a week  . Attends Religious Services: Never  . Active Member of Clubs or Organizations: No  . Attends Archivist Meetings: Never  . Marital Status: Widowed    Family History  Problem Relation Age of Onset  . Emphysema Mother   . Hypertension Father   . Cancer Sister        Ovarian  . Cancer Maternal Grandmother   . Cancer Maternal Grandfather   . Colon cancer Neg Hx     Medications:       Current Outpatient Medications:  .  albuterol (PROVENTIL HFA;VENTOLIN HFA) 108 (90 Base) MCG/ACT inhaler, Inhale 2 puffs into the lungs every 6 (six) hours as needed for wheezing or shortness of breath., Disp: , Rfl:  .  amLODipine (NORVASC) 5 MG tablet, Take 5 mg by mouth daily. , Disp: , Rfl:  .  baclofen (LIORESAL) 10 MG tablet, baclofen 10 mg tablet  Take 1 tablet 3 times a day by oral route., Disp: , Rfl:  .  cholecalciferol (VITAMIN D3) 25 MCG (1000 UT) tablet, Take 1,000 Units by mouth daily., Disp: , Rfl:  .  dicyclomine (BENTYL) 10 MG capsule, Take 1 capsule (10 mg total) by mouth 4 (four) times daily -  before meals and at bedtime. Hold for constipation, Disp: 120 capsule, Rfl: 1 .  escitalopram (LEXAPRO) 5 MG tablet, Take 5 mg by mouth daily., Disp: , Rfl:  .  gabapentin (NEURONTIN) 600 MG tablet, gabapentin 600 mg tablet  Take 1 tablet 4 times a day by oral route., Disp: , Rfl:  .  hydrALAZINE (APRESOLINE) 25 MG tablet, Take 25 mg by mouth 2 (two) times daily., Disp: , Rfl:  .  hydrochlorothiazide (HYDRODIURIL) 25 MG tablet, Take 25 mg by mouth daily., Disp: , Rfl:  .  linaclotide (LINZESS) 145 MCG CAPS capsule, Take 1 capsule (145 mcg total) by mouth daily before breakfast. (Patient taking differently: Take 145 mcg by mouth. ), Disp: 30 capsule, Rfl: 3 .  lisinopril (ZESTRIL) 40 MG tablet, Take 40 mg by mouth daily. , Disp: ,  Rfl:  .  lovastatin (MEVACOR) 20 MG tablet, Take 20 mg by mouth at bedtime. , Disp: , Rfl:  .  nortriptyline (PAMELOR) 25 MG  capsule, nortriptyline 25 mg capsule  Take 2 capsules every day by oral route at bedtime.  1 at bedtime x 2 weeks then 2 at bedtime as tolerated, Disp: , Rfl:  .  omeprazole (PRILOSEC) 20 MG capsule, Take 1 capsule (20 mg total) by mouth 2 (two) times daily before a meal. (Patient taking differently: Take 20 mg by mouth 2 (two) times daily. ), Disp: 60 capsule, Rfl: 3 .  oxybutynin (DITROPAN-XL) 10 MG 24 hr tablet, Take 1 tablet (10 mg total) by mouth daily., Disp: 30 tablet, Rfl: 11 .  potassium chloride SA (KLOR-CON) 20 MEQ tablet, potassium chloride ER 20 mEq tablet,extended release(part/cryst), Disp: , Rfl:  .  clobetasol cream (TEMOVATE) 0.24 %, Apply 1 application topically 2 (two) times daily., Disp: 30 g, Rfl: 11  Objective Blood pressure 108/73, pulse 80, height 5\' 3"  (1.6 m), weight 205 lb (93 kg).  General WDWN female NAD Vulva:  normal appearing vulva with no masses, tenderness or lesions Vagina:  normal mucosa, no discharge Cervix: Absent Uterus: Absent Adnexa: No masses   Pertinent ROS No burning with urination, frequency or urgency No nausea, vomiting or diarrhea Nor fever chills or other constitutional symptoms   Labs or studies Wet prep is performed today Vaginal swab is taken Placed on a slide with 2 drops of saline  Microscopy reveals normal gram-negative rod population normal white blood cells population Epithelial cells are clear no evidence of bacterial vaginosis No hyphae or evidence of yeast are seen Negative whiff test No trichomonas    Impression Diagnoses this Encounter::   [id number]   1. Lichen sclerosus et atrophicus vs Pad vulvitis  L90.0    will trial a course of temovate BID, it should improve her symptoms either way  2. Urge incontinence  N39.41     Established relevant diagnosis(es):   Plan/Recommendations: Meds ordered this encounter  Medications  . clobetasol cream (TEMOVATE) 0.05 %    Sig: Apply 1 application topically 2 (two)  times daily.    Dispense:  30 g    Refill:  11    Labs or Scans Ordered: No orders of the defined types were placed in this encounter.   Management:: As listed above under impression I think this is most likely a pad related vulvitis I am placing her on Temovate cream twice a day and going to follow her up in 3 months She does not have a cystocele and with her mixed incontinence doing any sort of bladder surgery would make her urge much worse I will see how she is doing in 3 months but we may have to address more aggressively the pad incontinence use  Follow up Return in about 3 months (around 10/18/2020) for Follow up, with Dr Elonda Husky.        All questions were answered.

## 2020-07-21 DIAGNOSIS — M25519 Pain in unspecified shoulder: Secondary | ICD-10-CM | POA: Diagnosis not present

## 2020-07-21 DIAGNOSIS — M542 Cervicalgia: Secondary | ICD-10-CM | POA: Diagnosis not present

## 2020-07-21 DIAGNOSIS — Z79891 Long term (current) use of opiate analgesic: Secondary | ICD-10-CM | POA: Diagnosis not present

## 2020-07-21 DIAGNOSIS — M545 Low back pain, unspecified: Secondary | ICD-10-CM | POA: Diagnosis not present

## 2020-08-01 DIAGNOSIS — F17210 Nicotine dependence, cigarettes, uncomplicated: Secondary | ICD-10-CM | POA: Diagnosis not present

## 2020-08-01 DIAGNOSIS — I10 Essential (primary) hypertension: Secondary | ICD-10-CM | POA: Diagnosis not present

## 2020-08-01 DIAGNOSIS — R3915 Urgency of urination: Secondary | ICD-10-CM | POA: Diagnosis not present

## 2020-08-01 DIAGNOSIS — Z79891 Long term (current) use of opiate analgesic: Secondary | ICD-10-CM | POA: Diagnosis not present

## 2020-08-01 DIAGNOSIS — R300 Dysuria: Secondary | ICD-10-CM | POA: Diagnosis not present

## 2020-08-01 DIAGNOSIS — M5030 Other cervical disc degeneration, unspecified cervical region: Secondary | ICD-10-CM | POA: Diagnosis not present

## 2020-08-05 DIAGNOSIS — I10 Essential (primary) hypertension: Secondary | ICD-10-CM | POA: Diagnosis not present

## 2020-08-05 DIAGNOSIS — E782 Mixed hyperlipidemia: Secondary | ICD-10-CM | POA: Diagnosis not present

## 2020-08-07 ENCOUNTER — Encounter: Payer: Self-pay | Admitting: Cardiology
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
## 2020-08-08 ENCOUNTER — Encounter: Payer: Self-pay | Admitting: Cardiology

## 2020-08-08 ENCOUNTER — Ambulatory Visit (HOSPITAL_COMMUNITY)
Admission: RE | Admit: 2020-08-08 | Discharge: 2020-08-08 | Disposition: A | Discharge status: Home / Self Care | Payer: Medicare Other | Source: Ambulatory Visit | Attending: Cardiology | Admitting: Cardiology

## 2020-08-08 ENCOUNTER — Ambulatory Visit (INDEPENDENT_AMBULATORY_CARE_PROVIDER_SITE_OTHER): Payer: Medicare Other | Admitting: Cardiology

## 2020-08-08 ENCOUNTER — Other Ambulatory Visit: Payer: Self-pay

## 2020-08-08 VITALS — BP 94/52 | HR 78 | Ht 63.0 in | Wt 208.0 lb

## 2020-08-08 DIAGNOSIS — M7989 Other specified soft tissue disorders: Secondary | ICD-10-CM | POA: Diagnosis not present

## 2020-08-08 DIAGNOSIS — R600 Localized edema: Secondary | ICD-10-CM | POA: Diagnosis not present

## 2020-08-08 DIAGNOSIS — R0602 Shortness of breath: Secondary | ICD-10-CM

## 2020-08-08 MED ORDER — FUROSEMIDE 20 MG PO TABS
20.0000 mg | ORAL_TABLET | Freq: Every day | ORAL | 0 refills | Status: DC
Start: 2020-08-08 — End: 2024-06-01

## 2020-08-08 NOTE — Patient Instructions (Addendum)
Medication Instructions:  Take Lasix 20mg  daily for 3 days *If you need a refill on your cardiac medications before your next appointment, please call your pharmacy*  Lab Work: Your physician recommends that you return for lab work today (BNP) If you have labs (blood work) drawn today and your tests are completely normal, you will receive your results only by: Marland Kitchen MyChart Message (if you have MyChart) OR . A paper copy in the mail If you have any lab test that is abnormal or we need to change your treatment, we will call you to review the results.  Testing/Procedures: Your physician has requested that you have a lower extremity venous duplex. This test is an ultrasound of the veins in the legs or arms. It looks at venous blood flow that carries blood from the heart to the legs or arms. Allow one hour for a Lower Venous exam. Allow thirty minutes for an Upper Venous exam. There are no restrictions or special instructions.  Your physician has requested that you have an echocardiogram. Echocardiography is a painless test that uses sound waves to create images of your heart. It provides your doctor with information about the size and shape of your heart and how well your heart's chambers and valves are working. This procedure takes approximately one hour. There are no restrictions for this procedure. [address]  Follow-Up: At [facility], you and your health needs are our priority.  As part of our continuing mission to provide you with exceptional heart care, we have created designated Provider Care Teams.  These Care Teams include your primary Cardiologist (physician) and Advanced Practice Providers (APPs -  Physician Assistants and Nurse Practitioners) who all work together to provide you with the care you need, when you need it.  Your next appointment:   Follow up as needed  Other Instructions Referral made to pulmonology

## 2020-08-09 LAB — BRAIN NATRIURETIC PEPTIDE: BNP: 10.7 pg/mL (ref 0.0–100.0)

## 2020-08-11 ENCOUNTER — Other Ambulatory Visit (HOSPITAL_COMMUNITY): Payer: Self-pay | Admitting: Internal Medicine

## 2020-08-11 DIAGNOSIS — Z1231 Encounter for screening mammogram for malignant neoplasm of breast: Secondary | ICD-10-CM

## 2020-08-11 DIAGNOSIS — Z0001 Encounter for general adult medical examination with abnormal findings: Secondary | ICD-10-CM | POA: Diagnosis not present

## 2020-08-27 ENCOUNTER — Telehealth: Payer: Self-pay | Admitting: Cardiology

## 2020-08-27 ENCOUNTER — Ambulatory Visit: Payer: Medicare Other | Admitting: Nurse Practitioner

## 2020-08-27 ENCOUNTER — Encounter: Payer: Self-pay | Admitting: Internal Medicine

## 2020-08-27 NOTE — Telephone Encounter (Signed)
Spoke with patient regarding Pulmonary Consult appointment scheduled 10/28/20 at 11:00 am with Dr. Verlon Setting, [address]---phone [phone].  Will mail information to patient and she voiced her understanding.

## 2020-09-01 ENCOUNTER — Other Ambulatory Visit (HOSPITAL_COMMUNITY): Payer: Self-pay | Admitting: Internal Medicine

## 2020-09-01 DIAGNOSIS — N63 Unspecified lump in unspecified breast: Secondary | ICD-10-CM

## 2020-09-02 ENCOUNTER — Other Ambulatory Visit: Payer: Self-pay

## 2020-09-02 ENCOUNTER — Ambulatory Visit (HOSPITAL_COMMUNITY)
Admission: RE | Admit: 2020-09-02 | Discharge: 2020-09-02 | Disposition: A | Discharge status: Home / Self Care | Payer: Medicare Other | Source: Ambulatory Visit | Attending: Internal Medicine | Admitting: Internal Medicine

## 2020-09-02 DIAGNOSIS — N6314 Unspecified lump in the right breast, lower inner quadrant: Secondary | ICD-10-CM | POA: Insufficient documentation

## 2020-09-02 DIAGNOSIS — N63 Unspecified lump in unspecified breast: Secondary | ICD-10-CM

## 2020-09-02 DIAGNOSIS — N644 Mastodynia: Secondary | ICD-10-CM | POA: Diagnosis not present

## 2020-09-02 DIAGNOSIS — N6310 Unspecified lump in the right breast, unspecified quadrant: Secondary | ICD-10-CM | POA: Diagnosis present

## 2020-09-05 ENCOUNTER — Other Ambulatory Visit (HOSPITAL_COMMUNITY): Payer: Medicare Other

## 2020-09-08 ENCOUNTER — Ambulatory Visit: Payer: Medicare Other | Admitting: Cardiology

## 2020-09-11 ENCOUNTER — Other Ambulatory Visit: Payer: Self-pay | Admitting: Cardiology

## 2020-09-19 DIAGNOSIS — E782 Mixed hyperlipidemia: Secondary | ICD-10-CM | POA: Diagnosis not present

## 2020-09-19 DIAGNOSIS — I10 Essential (primary) hypertension: Secondary | ICD-10-CM | POA: Diagnosis not present

## 2020-09-24 DIAGNOSIS — U071 COVID-19: Secondary | ICD-10-CM | POA: Diagnosis not present

## 2020-09-24 DIAGNOSIS — R600 Localized edema: Secondary | ICD-10-CM | POA: Diagnosis not present

## 2020-09-29 DIAGNOSIS — R0602 Shortness of breath: Secondary | ICD-10-CM | POA: Diagnosis not present

## 2020-09-29 DIAGNOSIS — Z8616 Personal history of COVID-19: Secondary | ICD-10-CM | POA: Diagnosis not present

## 2020-09-29 DIAGNOSIS — R059 Cough, unspecified: Secondary | ICD-10-CM | POA: Diagnosis not present

## 2020-09-30 DIAGNOSIS — Z8616 Personal history of COVID-19: Secondary | ICD-10-CM | POA: Diagnosis not present

## 2020-09-30 DIAGNOSIS — R059 Cough, unspecified: Secondary | ICD-10-CM | POA: Diagnosis not present

## 2020-09-30 DIAGNOSIS — R0602 Shortness of breath: Secondary | ICD-10-CM | POA: Diagnosis not present

## 2020-10-02 ENCOUNTER — Other Ambulatory Visit (HOSPITAL_COMMUNITY): Payer: Medicare Other

## 2020-10-06 DIAGNOSIS — Z8616 Personal history of COVID-19: Secondary | ICD-10-CM | POA: Diagnosis not present

## 2020-10-06 DIAGNOSIS — R0602 Shortness of breath: Secondary | ICD-10-CM | POA: Diagnosis not present

## 2020-10-06 DIAGNOSIS — R059 Cough, unspecified: Secondary | ICD-10-CM | POA: Diagnosis not present

## 2020-10-06 DIAGNOSIS — J028 Acute pharyngitis due to other specified organisms: Secondary | ICD-10-CM | POA: Diagnosis not present

## 2020-10-16 ENCOUNTER — Telehealth (HOSPITAL_COMMUNITY): Payer: Self-pay | Admitting: Cardiology

## 2020-10-16 NOTE — Telephone Encounter (Signed)
Patient cancelled echocardiogram for the below reasons:  10/09/20@1542  spoke with patient-just getting over [place] will call back to schedule evd 10/09/20 [facility] to reschedule @ 3:39/LBW  Canceled via automated reminder system (09/30/20 called patient to confirm she meant to cancel and No reply back/LBW 8:16)  Order will be removed from Echo WQ.

## 2020-10-18 DIAGNOSIS — R490 Dysphonia: Secondary | ICD-10-CM | POA: Diagnosis not present

## 2020-10-18 DIAGNOSIS — I10 Essential (primary) hypertension: Secondary | ICD-10-CM | POA: Diagnosis not present

## 2020-10-18 DIAGNOSIS — K219 Gastro-esophageal reflux disease without esophagitis: Secondary | ICD-10-CM | POA: Diagnosis not present

## 2020-10-20 ENCOUNTER — Ambulatory Visit: Payer: Medicare Other | Admitting: Obstetrics & Gynecology

## 2020-10-28 ENCOUNTER — Ambulatory Visit: Payer: Medicare Other | Admitting: Obstetrics & Gynecology

## 2020-10-28 ENCOUNTER — Institutional Professional Consult (permissible substitution): Payer: Medicare Other | Admitting: Pulmonary Disease

## 2020-10-28 DIAGNOSIS — R252 Cramp and spasm: Secondary | ICD-10-CM | POA: Diagnosis not present

## 2020-10-28 DIAGNOSIS — R490 Dysphonia: Secondary | ICD-10-CM | POA: Diagnosis not present

## 2020-10-28 DIAGNOSIS — J312 Chronic pharyngitis: Secondary | ICD-10-CM | POA: Diagnosis not present

## 2020-10-31 DIAGNOSIS — N12 Tubulo-interstitial nephritis, not specified as acute or chronic: Secondary | ICD-10-CM | POA: Diagnosis not present

## 2020-10-31 DIAGNOSIS — E785 Hyperlipidemia, unspecified: Secondary | ICD-10-CM | POA: Diagnosis not present

## 2020-10-31 DIAGNOSIS — N10 Acute tubulo-interstitial nephritis: Secondary | ICD-10-CM | POA: Diagnosis not present

## 2020-10-31 DIAGNOSIS — I10 Essential (primary) hypertension: Secondary | ICD-10-CM | POA: Diagnosis not present

## 2020-10-31 DIAGNOSIS — F17210 Nicotine dependence, cigarettes, uncomplicated: Secondary | ICD-10-CM | POA: Diagnosis not present

## 2020-10-31 DIAGNOSIS — K429 Umbilical hernia without obstruction or gangrene: Secondary | ICD-10-CM | POA: Diagnosis not present

## 2020-10-31 DIAGNOSIS — N3289 Other specified disorders of bladder: Secondary | ICD-10-CM | POA: Diagnosis not present

## 2020-10-31 DIAGNOSIS — I279 Pulmonary heart disease, unspecified: Secondary | ICD-10-CM | POA: Diagnosis not present

## 2020-10-31 DIAGNOSIS — R509 Fever, unspecified: Secondary | ICD-10-CM | POA: Diagnosis not present

## 2020-10-31 DIAGNOSIS — Z20822 Contact with and (suspected) exposure to covid-19: Secondary | ICD-10-CM | POA: Diagnosis not present

## 2020-10-31 DIAGNOSIS — R7881 Bacteremia: Secondary | ICD-10-CM | POA: Diagnosis not present

## 2020-10-31 DIAGNOSIS — A4150 Gram-negative sepsis, unspecified: Secondary | ICD-10-CM | POA: Diagnosis not present

## 2020-10-31 DIAGNOSIS — M160 Bilateral primary osteoarthritis of hip: Secondary | ICD-10-CM | POA: Diagnosis not present

## 2020-10-31 DIAGNOSIS — E876 Hypokalemia: Secondary | ICD-10-CM | POA: Diagnosis not present

## 2020-10-31 DIAGNOSIS — J449 Chronic obstructive pulmonary disease, unspecified: Secondary | ICD-10-CM | POA: Diagnosis not present

## 2020-10-31 DIAGNOSIS — D72829 Elevated white blood cell count, unspecified: Secondary | ICD-10-CM | POA: Diagnosis not present

## 2020-11-17 DIAGNOSIS — M5030 Other cervical disc degeneration, unspecified cervical region: Secondary | ICD-10-CM | POA: Diagnosis not present

## 2020-11-17 DIAGNOSIS — I10 Essential (primary) hypertension: Secondary | ICD-10-CM | POA: Diagnosis not present

## 2020-11-17 DIAGNOSIS — R3915 Urgency of urination: Secondary | ICD-10-CM | POA: Diagnosis not present

## 2020-11-17 DIAGNOSIS — J312 Chronic pharyngitis: Secondary | ICD-10-CM | POA: Diagnosis not present

## 2020-11-17 DIAGNOSIS — R300 Dysuria: Secondary | ICD-10-CM | POA: Diagnosis not present

## 2020-11-17 DIAGNOSIS — Z79891 Long term (current) use of opiate analgesic: Secondary | ICD-10-CM | POA: Diagnosis not present

## 2020-11-25 DIAGNOSIS — J449 Chronic obstructive pulmonary disease, unspecified: Secondary | ICD-10-CM | POA: Diagnosis not present

## 2020-11-25 DIAGNOSIS — I10 Essential (primary) hypertension: Secondary | ICD-10-CM | POA: Diagnosis not present

## 2020-11-25 DIAGNOSIS — E11638 Type 2 diabetes mellitus with other oral complications: Secondary | ICD-10-CM | POA: Diagnosis not present

## 2020-11-25 DIAGNOSIS — I700 Atherosclerosis of aorta: Secondary | ICD-10-CM | POA: Diagnosis not present

## 2020-11-25 DIAGNOSIS — Z79899 Other long term (current) drug therapy: Secondary | ICD-10-CM | POA: Diagnosis not present

## 2020-11-25 DIAGNOSIS — N3281 Overactive bladder: Secondary | ICD-10-CM | POA: Diagnosis not present

## 2020-11-25 DIAGNOSIS — E782 Mixed hyperlipidemia: Secondary | ICD-10-CM | POA: Diagnosis not present

## 2020-11-25 DIAGNOSIS — G894 Chronic pain syndrome: Secondary | ICD-10-CM | POA: Diagnosis not present

## 2020-11-25 DIAGNOSIS — K219 Gastro-esophageal reflux disease without esophagitis: Secondary | ICD-10-CM | POA: Diagnosis not present

## 2020-11-25 DIAGNOSIS — G2581 Restless legs syndrome: Secondary | ICD-10-CM | POA: Diagnosis not present

## 2020-12-01 ENCOUNTER — Ambulatory Visit (INDEPENDENT_AMBULATORY_CARE_PROVIDER_SITE_OTHER): Payer: Medicare Other | Admitting: Gastroenterology

## 2020-12-01 ENCOUNTER — Other Ambulatory Visit: Payer: Self-pay

## 2020-12-01 ENCOUNTER — Encounter: Payer: Self-pay | Admitting: Gastroenterology

## 2020-12-01 VITALS — BP 99/59 | HR 80 | Temp 97.1°F | Ht 63.0 in | Wt 199.2 lb

## 2020-12-01 DIAGNOSIS — R197 Diarrhea, unspecified: Secondary | ICD-10-CM

## 2020-12-01 DIAGNOSIS — K625 Hemorrhage of anus and rectum: Secondary | ICD-10-CM

## 2020-12-01 NOTE — Patient Instructions (Signed)
1. Please have stools done as discussed.  We will be in touch with results as soon as they are available. 2. Eat bland, avoid dairy products, drink enough fluid to hydrate.

## 2020-12-01 NOTE — Progress Notes (Signed)
Primary Care Physician: Celene Squibb, MD  Primary Gastroenterologist:  Garfield Cornea, MD   Chief Complaint  Patient presents with  . Rectal Bleeding    4 days ago was only time since tcs  . Diarrhea    X5 days after eating, has abd pain and has to go to bathroom    HPI: Stacey Brooks is a 60 y.o. female here for further evaluation of diarrhea and rectal bleeding.  Patient was last seen in the office back in February 2021.  She has a history of alternating constipation and diarrhea felt to be related to IBS.  History of multiple tubular adenomas seen on June 2021 colonoscopy.  She also had diverticulosis.  Next colonoscopy planned for June 2024.  Of note, patient had poor bowel prep at time of attempted colonoscopy in December 2020.  She was given modified bowel prep.  Last few months has had no issues with constipation. Had been using stool softeners. Stopped when the diarrhea started five days ago. Four days ago some fresh blood. Saw it once. No melena. BM 4-5 times per day after meals. No nocturnal BMs.  No upper GI symptoms.  No ill contacts. Dad in rehab so living by herself.  Patient was hospitalized last month with sepsis related to UTI.  Was provided antibiotics.  Hospitalized for 4 days.      Current Outpatient Medications  Medication Sig Dispense Refill  . albuterol (PROVENTIL HFA;VENTOLIN HFA) 108 (90 Base) MCG/ACT inhaler Inhale 2 puffs into the lungs every 6 (six) hours as needed for wheezing or shortness of breath.    Marland Kitchen amLODipine (NORVASC) 5 MG tablet Take 5 mg by mouth daily.     . cholecalciferol (VITAMIN D3) 25 MCG (1000 UT) tablet Take 1,000 Units by mouth daily.    . clobetasol cream (TEMOVATE) 0.25 % Apply 1 application topically 2 (two) times daily. 30 g 11  . escitalopram (LEXAPRO) 5 MG tablet Take 5 mg by mouth daily.    Marland Kitchen gabapentin (NEURONTIN) 600 MG tablet gabapentin 600 mg tablet  Take 1 tablet 4 times a day by oral route.    . hydrALAZINE (APRESOLINE)  25 MG tablet Take 25 mg by mouth 2 (two) times daily.    . hydrochlorothiazide (HYDRODIURIL) 25 MG tablet Take 25 mg by mouth daily.    Marland Kitchen lisinopril (ZESTRIL) 40 MG tablet Take 40 mg by mouth daily.     Marland Kitchen lovastatin (MEVACOR) 20 MG tablet Take 20 mg by mouth at bedtime.     . nortriptyline (PAMELOR) 25 MG capsule Take 25 mg by mouth daily.    Marland Kitchen omeprazole (PRILOSEC) 20 MG capsule Take 1 capsule (20 mg total) by mouth 2 (two) times daily before a meal. 60 capsule 3  . oxybutynin (DITROPAN-XL) 10 MG 24 hr tablet Take 1 tablet (10 mg total) by mouth daily. 30 tablet 11  . Potassium 99 MG TABS Take 1 tablet by mouth daily.    . furosemide (LASIX) 20 MG tablet Take 1 tablet (20 mg total) by mouth daily. Take 1 tablet for 3 days. (Patient not taking: Reported on 12/01/2020) 25 tablet 0   No current facility-administered medications for this visit.    Allergies as of 12/01/2020 - Review Complete 12/01/2020  Allergen Reaction Noted  . Clindamycin/lincomycin Rash 10/27/2015  . Erythromycin Itching and Rash 10/27/2015    ROS:  General: Negative for anorexia, weight loss, fever, chills, fatigue, weakness. ENT: Negative for hoarseness, difficulty swallowing ,  nasal congestion. CV: Negative for chest pain, angina, palpitations, dyspnea on exertion, peripheral edema.  Respiratory: Negative for dyspnea at rest, dyspnea on exertion, cough, sputum, wheezing.  GI: See history of present illness. GU:  Negative for dysuria, hematuria, urinary incontinence, urinary frequency, nocturnal urination.  Endo: Negative for unusual weight change.    Physical Examination:   BP (!) 99/59   Pulse 80   Temp (!) 97.1 F (36.2 C) (Temporal)   Ht _0  (1.6 m)   Wt 199 lb 3.2 oz (90.4 kg)   BMI 35.29 kg/m   General: Well-nourished, well-developed in no acute distress.  Eyes: No icterus. Mouth: masked Lungs: Clear to auscultation bilaterally.  Heart: Regular rate and rhythm, no murmurs rubs or gallops.   Abdomen: Bowel sounds are normal, nondistended, no hepatosplenomegaly or masses, no abdominal bruits or hernia , no rebound or guarding.  Epigastric tenderness with deep palpation Extremities: No lower extremity edema. No clubbing or deformities. Neuro: Alert and oriented x 4   Skin: Warm and dry, no jaundice.   Psych: Alert and cooperative, normal mood and affect.  Labs:  Labs from 11/02/2020 while inpatient at [facility]: Sodium 144, potassium 4.5, BUN 8, creatinine 0.86, alk phos 119, AST 27, ALT 25, total bilirubin 0.2 blood cell count 6500, hemoglobin 12.7, platelets 190,000, respiratory pathogen panel with COVID-19 negative, blood culture grew E. coli, urine culture grew greater than 100,000 CFU E. Coli  Imaging Studies:  CT abdomen pelvis with contrast 10/31/2020: Bilateral perinephric stranding, significantly greater on the left, diffuse urothelial thickening and bladder wall thickening consistent with ascending urinary tract infection and left pyelonephritis and early right pyelonephritis may be developing.  Stomach, small bowel, colon unremarkable.  Assessment/plan:  60 year old female with history of suspected IBS who presents with 5-day history of persistent diarrhea, single episode of hematochezia.  History significant for hospitalization for sepsis related to UTI.  Urine culture and blood cultures positive for E. coli.  Treated with antibiotic therapy.  She is at increased risk for C. difficile colitis.  Patient does not appear acutely ill.  She is hydrating well.  Oral intake is normal.  1. Send stool for C. difficile [facility], GI pathogen panel.  Encourage patient to submit stool ASAP. 2. Limit dairy intake for now, bland foods.  Drink adequate fluids to stay hydrated.

## 2020-12-03 NOTE — Progress Notes (Signed)
CC'ED TO PCP 

## 2020-12-04 ENCOUNTER — Other Ambulatory Visit (HOSPITAL_COMMUNITY): Payer: Self-pay | Admitting: Neurology

## 2020-12-04 DIAGNOSIS — M25552 Pain in left hip: Secondary | ICD-10-CM | POA: Diagnosis not present

## 2020-12-04 DIAGNOSIS — M25551 Pain in right hip: Secondary | ICD-10-CM

## 2020-12-04 DIAGNOSIS — R7303 Prediabetes: Secondary | ICD-10-CM | POA: Diagnosis not present

## 2020-12-04 DIAGNOSIS — G4700 Insomnia, unspecified: Secondary | ICD-10-CM | POA: Diagnosis not present

## 2020-12-04 DIAGNOSIS — M25519 Pain in unspecified shoulder: Secondary | ICD-10-CM | POA: Diagnosis not present

## 2020-12-04 DIAGNOSIS — M542 Cervicalgia: Secondary | ICD-10-CM | POA: Diagnosis not present

## 2020-12-04 DIAGNOSIS — E559 Vitamin D deficiency, unspecified: Secondary | ICD-10-CM | POA: Diagnosis not present

## 2020-12-04 DIAGNOSIS — Z79899 Other long term (current) drug therapy: Secondary | ICD-10-CM | POA: Diagnosis not present

## 2020-12-04 DIAGNOSIS — M545 Low back pain, unspecified: Secondary | ICD-10-CM

## 2020-12-17 DIAGNOSIS — E782 Mixed hyperlipidemia: Secondary | ICD-10-CM | POA: Diagnosis not present

## 2020-12-17 DIAGNOSIS — I10 Essential (primary) hypertension: Secondary | ICD-10-CM | POA: Diagnosis not present

## 2020-12-24 DIAGNOSIS — I10 Essential (primary) hypertension: Secondary | ICD-10-CM | POA: Diagnosis not present

## 2020-12-24 DIAGNOSIS — E11638 Type 2 diabetes mellitus with other oral complications: Secondary | ICD-10-CM | POA: Diagnosis not present

## 2020-12-24 DIAGNOSIS — F17210 Nicotine dependence, cigarettes, uncomplicated: Secondary | ICD-10-CM | POA: Diagnosis not present

## 2020-12-24 DIAGNOSIS — G2581 Restless legs syndrome: Secondary | ICD-10-CM | POA: Diagnosis not present

## 2020-12-24 DIAGNOSIS — J449 Chronic obstructive pulmonary disease, unspecified: Secondary | ICD-10-CM | POA: Diagnosis not present

## 2020-12-24 DIAGNOSIS — G894 Chronic pain syndrome: Secondary | ICD-10-CM | POA: Diagnosis not present

## 2020-12-24 DIAGNOSIS — Z79899 Other long term (current) drug therapy: Secondary | ICD-10-CM | POA: Diagnosis not present

## 2021-01-18 DIAGNOSIS — K219 Gastro-esophageal reflux disease without esophagitis: Secondary | ICD-10-CM | POA: Diagnosis not present

## 2021-01-18 DIAGNOSIS — I10 Essential (primary) hypertension: Secondary | ICD-10-CM | POA: Diagnosis not present

## 2021-01-28 ENCOUNTER — Other Ambulatory Visit: Payer: Self-pay

## 2021-01-28 ENCOUNTER — Ambulatory Visit (HOSPITAL_COMMUNITY)
Admission: RE | Admit: 2021-01-28 | Discharge: 2021-01-28 | Disposition: A | Discharge status: Home / Self Care | Payer: Medicare Other | Source: Ambulatory Visit | Attending: Neurology | Admitting: Neurology

## 2021-01-28 DIAGNOSIS — M542 Cervicalgia: Secondary | ICD-10-CM | POA: Insufficient documentation

## 2021-01-28 DIAGNOSIS — M545 Low back pain, unspecified: Secondary | ICD-10-CM | POA: Insufficient documentation

## 2021-01-28 DIAGNOSIS — M47816 Spondylosis without myelopathy or radiculopathy, lumbar region: Secondary | ICD-10-CM | POA: Diagnosis not present

## 2021-01-28 DIAGNOSIS — M25551 Pain in right hip: Secondary | ICD-10-CM | POA: Insufficient documentation

## 2021-01-28 DIAGNOSIS — M25552 Pain in left hip: Secondary | ICD-10-CM | POA: Insufficient documentation

## 2021-02-19 DIAGNOSIS — M19011 Primary osteoarthritis, right shoulder: Secondary | ICD-10-CM | POA: Diagnosis not present

## 2021-02-19 DIAGNOSIS — M160 Bilateral primary osteoarthritis of hip: Secondary | ICD-10-CM | POA: Diagnosis not present

## 2021-02-19 DIAGNOSIS — M19012 Primary osteoarthritis, left shoulder: Secondary | ICD-10-CM | POA: Diagnosis not present

## 2021-02-19 DIAGNOSIS — M47816 Spondylosis without myelopathy or radiculopathy, lumbar region: Secondary | ICD-10-CM | POA: Diagnosis not present

## 2021-03-11 DIAGNOSIS — M19019 Primary osteoarthritis, unspecified shoulder: Secondary | ICD-10-CM | POA: Diagnosis not present

## 2021-03-11 DIAGNOSIS — M7918 Myalgia, other site: Secondary | ICD-10-CM | POA: Diagnosis not present

## 2021-03-18 DIAGNOSIS — G4700 Insomnia, unspecified: Secondary | ICD-10-CM | POA: Diagnosis not present

## 2021-03-18 DIAGNOSIS — M25551 Pain in right hip: Secondary | ICD-10-CM | POA: Diagnosis not present

## 2021-03-18 DIAGNOSIS — R7303 Prediabetes: Secondary | ICD-10-CM | POA: Diagnosis not present

## 2021-03-18 DIAGNOSIS — M545 Low back pain, unspecified: Secondary | ICD-10-CM | POA: Diagnosis not present

## 2021-03-18 DIAGNOSIS — M25552 Pain in left hip: Secondary | ICD-10-CM | POA: Diagnosis not present

## 2021-03-18 DIAGNOSIS — M542 Cervicalgia: Secondary | ICD-10-CM | POA: Diagnosis not present

## 2021-03-18 DIAGNOSIS — M25519 Pain in unspecified shoulder: Secondary | ICD-10-CM | POA: Diagnosis not present

## 2021-03-18 DIAGNOSIS — E559 Vitamin D deficiency, unspecified: Secondary | ICD-10-CM | POA: Diagnosis not present

## 2021-03-19 DIAGNOSIS — I10 Essential (primary) hypertension: Secondary | ICD-10-CM | POA: Diagnosis not present

## 2021-03-19 DIAGNOSIS — K219 Gastro-esophageal reflux disease without esophagitis: Secondary | ICD-10-CM | POA: Diagnosis not present

## 2021-03-28 DIAGNOSIS — M4306 Spondylolysis, lumbar region: Secondary | ICD-10-CM | POA: Diagnosis not present

## 2021-03-28 DIAGNOSIS — M545 Low back pain, unspecified: Secondary | ICD-10-CM | POA: Diagnosis not present

## 2021-04-19 DIAGNOSIS — I10 Essential (primary) hypertension: Secondary | ICD-10-CM | POA: Diagnosis not present

## 2021-04-19 DIAGNOSIS — K219 Gastro-esophageal reflux disease without esophagitis: Secondary | ICD-10-CM | POA: Diagnosis not present

## 2021-04-28 DIAGNOSIS — E118 Type 2 diabetes mellitus with unspecified complications: Secondary | ICD-10-CM | POA: Diagnosis not present

## 2021-04-28 DIAGNOSIS — Z1329 Encounter for screening for other suspected endocrine disorder: Secondary | ICD-10-CM | POA: Diagnosis not present

## 2021-04-28 DIAGNOSIS — E782 Mixed hyperlipidemia: Secondary | ICD-10-CM | POA: Diagnosis not present

## 2021-04-30 DIAGNOSIS — I10 Essential (primary) hypertension: Secondary | ICD-10-CM | POA: Diagnosis not present

## 2021-04-30 DIAGNOSIS — K219 Gastro-esophageal reflux disease without esophagitis: Secondary | ICD-10-CM | POA: Diagnosis not present

## 2021-06-02 DIAGNOSIS — R1010 Upper abdominal pain, unspecified: Secondary | ICD-10-CM | POA: Diagnosis not present

## 2021-06-02 DIAGNOSIS — E782 Mixed hyperlipidemia: Secondary | ICD-10-CM | POA: Diagnosis not present

## 2021-06-02 DIAGNOSIS — R7401 Elevation of levels of liver transaminase levels: Secondary | ICD-10-CM | POA: Diagnosis not present

## 2021-06-02 DIAGNOSIS — R911 Solitary pulmonary nodule: Secondary | ICD-10-CM | POA: Diagnosis not present

## 2021-06-02 DIAGNOSIS — I10 Essential (primary) hypertension: Secondary | ICD-10-CM | POA: Diagnosis not present

## 2021-06-02 DIAGNOSIS — Z1231 Encounter for screening mammogram for malignant neoplasm of breast: Secondary | ICD-10-CM | POA: Diagnosis not present

## 2021-06-02 DIAGNOSIS — E1165 Type 2 diabetes mellitus with hyperglycemia: Secondary | ICD-10-CM | POA: Diagnosis not present

## 2021-06-02 DIAGNOSIS — G894 Chronic pain syndrome: Secondary | ICD-10-CM | POA: Diagnosis not present

## 2021-06-02 DIAGNOSIS — K219 Gastro-esophageal reflux disease without esophagitis: Secondary | ICD-10-CM | POA: Diagnosis not present

## 2021-06-02 DIAGNOSIS — N3281 Overactive bladder: Secondary | ICD-10-CM | POA: Diagnosis not present

## 2021-06-06 DIAGNOSIS — E119 Type 2 diabetes mellitus without complications: Secondary | ICD-10-CM | POA: Diagnosis not present

## 2021-06-06 DIAGNOSIS — Z0100 Encounter for examination of eyes and vision without abnormal findings: Secondary | ICD-10-CM | POA: Diagnosis not present

## 2021-06-16 DIAGNOSIS — Z08 Encounter for follow-up examination after completed treatment for malignant neoplasm: Secondary | ICD-10-CM | POA: Diagnosis not present

## 2021-06-16 DIAGNOSIS — L570 Actinic keratosis: Secondary | ICD-10-CM | POA: Diagnosis not present

## 2021-06-16 DIAGNOSIS — X32XXXD Exposure to sunlight, subsequent encounter: Secondary | ICD-10-CM | POA: Diagnosis not present

## 2021-06-16 DIAGNOSIS — Z85828 Personal history of other malignant neoplasm of skin: Secondary | ICD-10-CM | POA: Diagnosis not present

## 2021-06-16 DIAGNOSIS — L281 Prurigo nodularis: Secondary | ICD-10-CM | POA: Diagnosis not present

## 2021-06-18 ENCOUNTER — Other Ambulatory Visit (HOSPITAL_COMMUNITY): Payer: Self-pay | Admitting: Family Medicine

## 2021-06-18 ENCOUNTER — Other Ambulatory Visit: Payer: Self-pay | Admitting: Family Medicine

## 2021-06-18 DIAGNOSIS — R1010 Upper abdominal pain, unspecified: Secondary | ICD-10-CM

## 2021-06-19 DIAGNOSIS — K219 Gastro-esophageal reflux disease without esophagitis: Secondary | ICD-10-CM | POA: Diagnosis not present

## 2021-06-19 DIAGNOSIS — I10 Essential (primary) hypertension: Secondary | ICD-10-CM | POA: Diagnosis not present

## 2021-06-19 DIAGNOSIS — E785 Hyperlipidemia, unspecified: Secondary | ICD-10-CM | POA: Diagnosis not present

## 2021-06-22 ENCOUNTER — Other Ambulatory Visit (HOSPITAL_COMMUNITY): Payer: Self-pay | Admitting: Family Medicine

## 2021-06-22 DIAGNOSIS — R928 Other abnormal and inconclusive findings on diagnostic imaging of breast: Secondary | ICD-10-CM

## 2021-06-29 NOTE — Progress Notes (Signed)
H&P  Chief Complaint: Urinary leakage  History of Present Illness: Stacey Brooks is a 60 y.o. year old female 60 year old female presents today with urinary complaints.  She states that she has urinary frequency, urgency, urgency incontinence.  She also has nocturia x3.  She does not have frequent urinary tract infections but she states that she was admitted to [facility] in mid 2021 with urosepsis.  She does state that for quite some time her bladder has dropped.  This was noted at least 10 years ago by gynecologist.  She does not currently see a gynecologist.  She does feel tissue protruding through her vaginal opening.  She does not have significant lower GI problems/constipation.  She usually has a good stream and feels like she empties fairly well.  She is not sexually active.  She states that her stress incontinence is a bit worse than her urgency incontinence.  She does wear several pads a day.  Past Medical History:  Diagnosis Date   ANA positive    Anxiety    Asthma    Intermittent; asthmatic bronchitis   Cancer (HCC)    skin cancer L arm   Carpal tunnel syndrome on right 10/11/2008   Cervical nerve root impingement 10/03/2012   Cervical spondylolysis 10/03/2012   Chronic back pain    Colon polyps 08/25/2012   Constipation    COPD (chronic obstructive pulmonary disease) ([place]) 06/25/2011   De Quervain's disease (tenosynovitis) 07/02/2013   Depression 06/25/2011   Diverticulosis 08/25/2012   Fatty liver 06/10/2015   Fibromyalgia 06/30/2009   GERD (gastroesophageal reflux disease) 07/03/2003   Hemorrhoids 07/01/2014   Hirsutism    Hypercholesterolemia 07/05/2003   Mild   Hyperlipidemia    Hypertension    IBS (irritable bowel syndrome) 2010   Insomnia 2010   Lumbar degenerative disc disease    Migraine 08/17/2019   Neural foraminal stenosis of cervical spine    Obesity 08/23/2013   Obstructive chronic bronchitis ([place])    Osteoarthritis 06/25/2011   Pilar cyst 07/30/2011    Polymyalgia ([place]) 03/31/2009   SS-A antibody positive 08/22/2012   Suicide attempt ([place]) 06/25/2011   Thyroid disease    Hypothyroidism   Tobacco abuse 07/03/2003   Trigger middle finger 05/30/2008    Past Surgical History:  Procedure Laterality Date   ABDOMINAL HYSTERECTOMY  04/04/2013   BACK SURGERY  2007   CARPECTOMY WITH RADIAL STYLOIDECTOMY  01/05/2012   DeQuervain's Release    CERVICAL FUSION  2007   COLONOSCOPY WITH PROPOFOL N/A 09/12/2019   Procedure: COLONOSCOPY WITH PROPOFOL;  Surgeon: Daneil Dolin, MD;  [facility];  Service: Endoscopy;  Laterality: N/A;  7:30am   COLONOSCOPY WITH PROPOFOL N/A 03/04/2020   Dr. Gala Romney: Diverticulosis.  Multiple polyps removed.  Several tubular adenomas.  Next colonoscopy in 3 years will require modified bowel prep due to history of inadequate bowel prep before.Marland Kitchen   POLYPECTOMY  09/12/2019   Procedure: POLYPECTOMY;  Surgeon: Daneil Dolin, MD;  [facility];  Service: Endoscopy;;  hepatic flexure x5; cecal; descending   POLYPECTOMY  03/04/2020   Procedure: POLYPECTOMY;  Surgeon: Daneil Dolin, MD;  [facility];  Service: Endoscopy;;   WRIST ARTHROSCOPY  01/05/2012   release of carpal ligament    Home Medications:  (Not in a hospital admission)   Allergies:  Allergies  Allergen Reactions   Clindamycin/Lincomycin Rash   Erythromycin Itching and Rash    Family History  Problem Relation Age of  Onset   Emphysema Mother    Hypertension Father    Stroke Father    Cancer Maternal Grandmother    Cancer Maternal Grandfather    Ovarian cancer Sister    Colon cancer Neg Hx     Social History:  reports that she has been smoking cigarettes. She has a 8.75 pack-year smoking history. She has never used smokeless tobacco. She reports that she does not currently use drugs. She reports that she does not drink alcohol.  ROS: A complete review of systems was performed.  All systems are negative except for  pertinent findings as noted.  Physical Exam:  Vital signs in last 24 hours: @VSRANGES @ General:  Alert and oriented, No acute distress HEENT: Normocephalic, atraumatic Neck: No JVD or lymphadenopathy Cardiovascular: Regular rate  Lungs: Normal inspiratory/expiratory excursion Abdomen: Soft, nontender, nondistended, no abdominal masses.  Obese. Back: No CVA tenderness Pelvic: Mild rectocele.  I did not appreciate significant cystocele.  Vaginal mucosa normal without discharge. Extremities: No edema Neurologic: Grossly intact  I have reviewed prior pt notes  I have reviewed notes from referring/previous physicians  I have reviewed urinalysis results  I have independently reviewed prior imaging  Bladder scan today revealed no residual urine volume    Impression/Assessment:  1.  Pelvic prolapse by history, I do not see significant cystocele today but she does have a mild rectocele  2.  Mixed urinary incontinence  3.  Overactive bladder, question whether she takes her oxybutynin every day  Plan:  1.  I gave her an overactive bladder guide sheet  2.  Make sure she takes the oxybutynin 15 mg every day  3.  I will see her back in about 3 months for recheck of symptoms  4.  Weight loss recommended  Lillette Boxer Addis Tuohy 06/29/2021, 11:55 AM  Lillette Boxer. Eryc Bodey MD

## 2021-06-30 ENCOUNTER — Ambulatory Visit (INDEPENDENT_AMBULATORY_CARE_PROVIDER_SITE_OTHER): Payer: Medicare Other | Admitting: Urology

## 2021-06-30 ENCOUNTER — Encounter (HOSPITAL_COMMUNITY): Payer: Self-pay

## 2021-06-30 ENCOUNTER — Ambulatory Visit (HOSPITAL_COMMUNITY): Payer: Medicare Other

## 2021-06-30 ENCOUNTER — Other Ambulatory Visit: Payer: Self-pay

## 2021-06-30 VITALS — BP 102/65 | HR 80 | Wt 192.6 lb

## 2021-06-30 DIAGNOSIS — R32 Unspecified urinary incontinence: Secondary | ICD-10-CM

## 2021-06-30 DIAGNOSIS — R351 Nocturia: Secondary | ICD-10-CM

## 2021-06-30 DIAGNOSIS — R350 Frequency of micturition: Secondary | ICD-10-CM | POA: Diagnosis not present

## 2021-06-30 DIAGNOSIS — N3946 Mixed incontinence: Secondary | ICD-10-CM

## 2021-06-30 DIAGNOSIS — R3915 Urgency of urination: Secondary | ICD-10-CM

## 2021-06-30 DIAGNOSIS — N819 Female genital prolapse, unspecified: Secondary | ICD-10-CM

## 2021-06-30 NOTE — Progress Notes (Signed)
Urological Symptom Review  Patient is experiencing the following symptoms: Frequent urination Get up at night to urinate Leakage of urine   Review of Systems  Gastrointestinal (upper)  : Indigestion/heartburn  Gastrointestinal (lower) : Negative for lower GI symptoms  Constitutional : Negative for symptoms  Skin: Skin rash/lesion Itching  Eyes: Blurred vision Double vision  Ear/Nose/Throat : Sinus problems  Hematologic/Lymphatic: Easy bruising  Cardiovascular : Leg swelling  Respiratory : Shortness of breath  Endocrine: Excessive thirst  Musculoskeletal: Back pain  Neurological: Headaches Dizziness  Psychologic: Depression Anxiety

## 2021-07-15 ENCOUNTER — Ambulatory Visit (HOSPITAL_COMMUNITY)
Admission: RE | Admit: 2021-07-15 | Discharge: 2021-07-15 | Disposition: A | Discharge status: Home / Self Care | Payer: Medicare Other | Source: Ambulatory Visit | Attending: Family Medicine | Admitting: Family Medicine

## 2021-07-15 ENCOUNTER — Other Ambulatory Visit: Payer: Self-pay

## 2021-07-15 DIAGNOSIS — K5900 Constipation, unspecified: Secondary | ICD-10-CM | POA: Diagnosis not present

## 2021-07-15 DIAGNOSIS — R197 Diarrhea, unspecified: Secondary | ICD-10-CM | POA: Diagnosis not present

## 2021-07-15 DIAGNOSIS — R1010 Upper abdominal pain, unspecified: Secondary | ICD-10-CM | POA: Insufficient documentation

## 2021-07-15 DIAGNOSIS — K449 Diaphragmatic hernia without obstruction or gangrene: Secondary | ICD-10-CM | POA: Diagnosis not present

## 2021-07-15 DIAGNOSIS — K6389 Other specified diseases of intestine: Secondary | ICD-10-CM | POA: Diagnosis not present

## 2021-07-15 LAB — POCT I-STAT CREATININE: Creatinine, Ser: 0.8 mg/dL (ref 0.44–1.00)

## 2021-07-15 MED ORDER — IOHEXOL 300 MG/ML  SOLN
100.0000 mL | Freq: Once | INTRAMUSCULAR | Status: AC | PRN
  Administered 2021-07-15: 13:00:00 100 mL via INTRAVENOUS

## 2021-07-20 DIAGNOSIS — K219 Gastro-esophageal reflux disease without esophagitis: Secondary | ICD-10-CM | POA: Diagnosis not present

## 2021-07-20 DIAGNOSIS — I10 Essential (primary) hypertension: Secondary | ICD-10-CM | POA: Diagnosis not present

## 2021-07-23 ENCOUNTER — Encounter: Payer: Self-pay | Admitting: Internal Medicine

## 2021-07-29 DIAGNOSIS — L280 Lichen simplex chronicus: Secondary | ICD-10-CM | POA: Diagnosis not present

## 2021-07-29 DIAGNOSIS — X32XXXD Exposure to sunlight, subsequent encounter: Secondary | ICD-10-CM | POA: Diagnosis not present

## 2021-07-29 DIAGNOSIS — L570 Actinic keratosis: Secondary | ICD-10-CM | POA: Diagnosis not present

## 2021-07-29 DIAGNOSIS — S60861A Insect bite (nonvenomous) of right wrist, initial encounter: Secondary | ICD-10-CM | POA: Diagnosis not present

## 2021-07-29 DIAGNOSIS — L281 Prurigo nodularis: Secondary | ICD-10-CM | POA: Diagnosis not present

## 2021-08-19 DIAGNOSIS — I10 Essential (primary) hypertension: Secondary | ICD-10-CM | POA: Diagnosis not present

## 2021-08-19 DIAGNOSIS — K219 Gastro-esophageal reflux disease without esophagitis: Secondary | ICD-10-CM | POA: Diagnosis not present

## 2021-08-28 ENCOUNTER — Encounter: Payer: Self-pay | Admitting: Internal Medicine

## 2021-08-28 ENCOUNTER — Ambulatory Visit: Payer: Medicare Other | Admitting: Internal Medicine

## 2021-09-18 DIAGNOSIS — I10 Essential (primary) hypertension: Secondary | ICD-10-CM | POA: Diagnosis not present

## 2021-09-18 DIAGNOSIS — K219 Gastro-esophageal reflux disease without esophagitis: Secondary | ICD-10-CM | POA: Diagnosis not present

## 2021-09-27 NOTE — Progress Notes (Incomplete)
History of Present Illness:   10.11.2022:  Original presentation-- with c/o urinary frequency, urgency, urgency incontinence.  She also has nocturia x3.  She does not have frequent urinary tract infections but she states that she was admitted to [facility] in mid 2021 with urosepsis.  She does state that for quite some time her bladder has dropped.  This was noted at least 10 years ago by gynecologist.  She does not currently see a gynecologist.  She does feel tissue protruding through her vaginal opening.  She does not have significant lower GI problems/constipation.  She usually has a good stream and feels like she empties fairly well.  She is not sexually active.  She states that her stress incontinence is a bit worse than her urgency incontinence.  She does wear several pads a day. She has had a scrip for oxybutynin but did not always take it.  I recommended daily use of that ed as well as behavioral modification.  Past Medical History:  Diagnosis Date   ANA positive    Anxiety    Asthma    Intermittent; asthmatic bronchitis   Cancer (HCC)    skin cancer L arm   Carpal tunnel syndrome on right 10/11/2008   Cervical nerve root impingement 10/03/2012   Cervical spondylolysis 10/03/2012   Chronic back pain    Colon polyps 08/25/2012   Constipation    COPD (chronic obstructive pulmonary disease) ([place]) 06/25/2011   De Quervain's disease (tenosynovitis) 07/02/2013   Depression 06/25/2011   Diverticulosis 08/25/2012   Fatty liver 06/10/2015   Fibromyalgia 06/30/2009   GERD (gastroesophageal reflux disease) 07/03/2003   Hemorrhoids 07/01/2014   Hirsutism    Hypercholesterolemia 07/05/2003   Mild   Hyperlipidemia    Hypertension    IBS (irritable bowel syndrome) 2010   Insomnia 2010   Lumbar degenerative disc disease    Migraine 08/17/2019   Neural foraminal stenosis of cervical spine    Obesity 08/23/2013   Obstructive chronic bronchitis ([place])    Osteoarthritis 06/25/2011   Pilar  cyst 07/30/2011   Polymyalgia ([place]) 03/31/2009   SS-A antibody positive 08/22/2012   Suicide attempt ([place]) 06/25/2011   Thyroid disease    Hypothyroidism   Tobacco abuse 07/03/2003   Trigger middle finger 05/30/2008    Past Surgical History:  Procedure Laterality Date   ABDOMINAL HYSTERECTOMY  04/04/2013   BACK SURGERY  2007   CARPECTOMY WITH RADIAL STYLOIDECTOMY  01/05/2012   DeQuervain's Release    CERVICAL FUSION  2007   COLONOSCOPY WITH PROPOFOL N/A 09/12/2019   Procedure: COLONOSCOPY WITH PROPOFOL;  Surgeon: Daneil Dolin, MD;  [facility];  Service: Endoscopy;  Laterality: N/A;  7:30am   COLONOSCOPY WITH PROPOFOL N/A 03/04/2020   Dr. Gala Romney: Diverticulosis.  Multiple polyps removed.  Several tubular adenomas.  Next colonoscopy in 3 years will require modified bowel prep due to history of inadequate bowel prep before.Marland Kitchen   POLYPECTOMY  09/12/2019   Procedure: POLYPECTOMY;  Surgeon: Daneil Dolin, MD;  [facility];  Service: Endoscopy;;  hepatic flexure x5; cecal; descending   POLYPECTOMY  03/04/2020   Procedure: POLYPECTOMY;  Surgeon: Daneil Dolin, MD;  [facility];  Service: Endoscopy;;   WRIST ARTHROSCOPY  01/05/2012   release of carpal ligament    Home Medications:  Allergies as of 09/29/2021       Reactions   Clindamycin/lincomycin Rash   Erythromycin Itching, Rash        Medication List  Accurate as of September 27, 2021  8:29 PM. If you have any questions, ask your nurse or doctor.          albuterol 108 (90 Base) MCG/ACT inhaler Commonly known as: VENTOLIN HFA Inhale 2 puffs into the lungs every 6 (six) hours as needed for wheezing or shortness of breath.   amLODipine 5 MG tablet Commonly known as: NORVASC Take 5 mg by mouth daily.   cholecalciferol 25 MCG (1000 UNIT) tablet Commonly known as: VITAMIN D3 Take 1,000 Units by mouth daily.   clobetasol cream 0.05 % Commonly known as: Temovate Apply 1 application  topically 2 (two) times daily.   escitalopram 5 MG tablet Commonly known as: LEXAPRO Take 5 mg by mouth daily.   furosemide 20 MG tablet Commonly known as: LASIX Take 1 tablet (20 mg total) by mouth daily. Take 1 tablet for 3 days.   gabapentin 600 MG tablet Commonly known as: NEURONTIN gabapentin 600 mg tablet  Take 1 tablet 4 times a day by oral route.   hydrALAZINE 25 MG tablet Commonly known as: APRESOLINE Take 25 mg by mouth 2 (two) times daily.   hydrochlorothiazide 25 MG tablet Commonly known as: HYDRODIURIL Take 25 mg by mouth daily.   lisinopril 40 MG tablet Commonly known as: ZESTRIL Take 40 mg by mouth daily.   lovastatin 20 MG tablet Commonly known as: MEVACOR Take 20 mg by mouth at bedtime.   nortriptyline 25 MG capsule Commonly known as: PAMELOR Take 25 mg by mouth daily.   omeprazole 20 MG capsule Commonly known as: PRILOSEC Take 1 capsule (20 mg total) by mouth 2 (two) times daily before a meal.   oxybutynin 10 MG 24 hr tablet Commonly known as: DITROPAN-XL Take 1 tablet (10 mg total) by mouth daily.   Potassium 99 MG Tabs Take 1 tablet by mouth daily.        Allergies:  Allergies  Allergen Reactions   Clindamycin/Lincomycin Rash   Erythromycin Itching and Rash    Family History  Problem Relation Age of Onset   Emphysema Mother    Hypertension Father    Stroke Father    Cancer Maternal Grandmother    Cancer Maternal Grandfather    Ovarian cancer Sister    Colon cancer Neg Hx     Social History:  reports that she has been smoking cigarettes. She has a 8.75 pack-year smoking history. She has never used smokeless tobacco. She reports that she does not currently use drugs. She reports that she does not drink alcohol.  ROS: A complete review of systems was performed.  All systems are negative except for pertinent findings as noted.  Physical Exam:  Vital signs in last 24 hours: There were no vitals taken for this  visit. Constitutional:  Alert and oriented, No acute distress Cardiovascular: Regular rate  Respiratory: Normal respiratory effort GI: Abdomen is soft, nontender, nondistended, no abdominal masses. No CVAT.  Genitourinary: Normal female phallus, testes are descended bilaterally and non-tender and without masses, scrotum is normal in appearance without lesions or masses, perineum is normal on inspection. Lymphatic: No lymphadenopathy Neurologic: Grossly intact, no focal deficits Psychiatric: Normal mood and affect  I have reviewed prior pt notes  I have reviewed notes from referring/previous physicians  I have reviewed urinalysis results  I have independently reviewed prior imaging  I have reviewed prior PSA results  I have reviewed prior urine culture   Impression/Assessment:  ***  Plan:  ***

## 2021-09-29 ENCOUNTER — Ambulatory Visit: Payer: Medicare Other | Admitting: Urology

## 2021-09-29 DIAGNOSIS — R3915 Urgency of urination: Secondary | ICD-10-CM

## 2021-09-29 DIAGNOSIS — R351 Nocturia: Secondary | ICD-10-CM

## 2021-09-29 DIAGNOSIS — N819 Female genital prolapse, unspecified: Secondary | ICD-10-CM

## 2021-09-29 DIAGNOSIS — R350 Frequency of micturition: Secondary | ICD-10-CM

## 2021-10-20 DIAGNOSIS — I10 Essential (primary) hypertension: Secondary | ICD-10-CM | POA: Diagnosis not present

## 2021-10-20 DIAGNOSIS — E785 Hyperlipidemia, unspecified: Secondary | ICD-10-CM | POA: Diagnosis not present

## 2021-10-20 DIAGNOSIS — K219 Gastro-esophageal reflux disease without esophagitis: Secondary | ICD-10-CM | POA: Diagnosis not present

## 2021-10-27 ENCOUNTER — Ambulatory Visit: Payer: Commercial Managed Care - HMO | Admitting: Urology

## 2021-11-12 DIAGNOSIS — R079 Chest pain, unspecified: Secondary | ICD-10-CM | POA: Diagnosis not present

## 2021-11-12 DIAGNOSIS — J302 Other seasonal allergic rhinitis: Secondary | ICD-10-CM | POA: Diagnosis not present

## 2021-11-12 DIAGNOSIS — L299 Pruritus, unspecified: Secondary | ICD-10-CM | POA: Diagnosis not present

## 2021-11-12 DIAGNOSIS — R400 Somnolence: Secondary | ICD-10-CM | POA: Diagnosis not present

## 2021-11-17 DIAGNOSIS — K219 Gastro-esophageal reflux disease without esophagitis: Secondary | ICD-10-CM | POA: Diagnosis not present

## 2021-11-17 DIAGNOSIS — I10 Essential (primary) hypertension: Secondary | ICD-10-CM | POA: Diagnosis not present

## 2021-12-08 DIAGNOSIS — I10 Essential (primary) hypertension: Secondary | ICD-10-CM | POA: Diagnosis not present

## 2021-12-08 DIAGNOSIS — E118 Type 2 diabetes mellitus with unspecified complications: Secondary | ICD-10-CM | POA: Diagnosis not present

## 2021-12-08 DIAGNOSIS — Z1329 Encounter for screening for other suspected endocrine disorder: Secondary | ICD-10-CM | POA: Diagnosis not present

## 2021-12-10 DIAGNOSIS — E782 Mixed hyperlipidemia: Secondary | ICD-10-CM | POA: Diagnosis not present

## 2021-12-10 DIAGNOSIS — I10 Essential (primary) hypertension: Secondary | ICD-10-CM | POA: Diagnosis not present

## 2021-12-10 DIAGNOSIS — G894 Chronic pain syndrome: Secondary | ICD-10-CM | POA: Diagnosis not present

## 2021-12-10 DIAGNOSIS — E1165 Type 2 diabetes mellitus with hyperglycemia: Secondary | ICD-10-CM | POA: Diagnosis not present

## 2021-12-10 DIAGNOSIS — Z0001 Encounter for general adult medical examination with abnormal findings: Secondary | ICD-10-CM | POA: Diagnosis not present

## 2021-12-10 DIAGNOSIS — Z1231 Encounter for screening mammogram for malignant neoplasm of breast: Secondary | ICD-10-CM | POA: Diagnosis not present

## 2021-12-10 DIAGNOSIS — N3281 Overactive bladder: Secondary | ICD-10-CM | POA: Diagnosis not present

## 2021-12-18 DIAGNOSIS — K219 Gastro-esophageal reflux disease without esophagitis: Secondary | ICD-10-CM | POA: Diagnosis not present

## 2021-12-18 DIAGNOSIS — I10 Essential (primary) hypertension: Secondary | ICD-10-CM | POA: Diagnosis not present

## 2021-12-28 ENCOUNTER — Other Ambulatory Visit (HOSPITAL_COMMUNITY): Payer: Self-pay | Admitting: Family Medicine

## 2021-12-28 DIAGNOSIS — R928 Other abnormal and inconclusive findings on diagnostic imaging of breast: Secondary | ICD-10-CM

## 2022-01-17 DIAGNOSIS — E1165 Type 2 diabetes mellitus with hyperglycemia: Secondary | ICD-10-CM | POA: Diagnosis not present

## 2022-01-17 DIAGNOSIS — E782 Mixed hyperlipidemia: Secondary | ICD-10-CM | POA: Diagnosis not present

## 2022-01-17 DIAGNOSIS — I10 Essential (primary) hypertension: Secondary | ICD-10-CM | POA: Diagnosis not present

## 2022-01-17 DIAGNOSIS — K219 Gastro-esophageal reflux disease without esophagitis: Secondary | ICD-10-CM | POA: Diagnosis not present

## 2022-01-19 ENCOUNTER — Ambulatory Visit (HOSPITAL_COMMUNITY): Payer: Medicare Other

## 2022-01-19 ENCOUNTER — Inpatient Hospital Stay (HOSPITAL_COMMUNITY): Admission: RE | Admit: 2022-01-19 | Payer: Commercial Managed Care - HMO | Source: Ambulatory Visit

## 2022-01-19 ENCOUNTER — Encounter (HOSPITAL_COMMUNITY): Payer: Self-pay

## 2022-02-17 DIAGNOSIS — E782 Mixed hyperlipidemia: Secondary | ICD-10-CM | POA: Diagnosis not present

## 2022-02-17 DIAGNOSIS — I10 Essential (primary) hypertension: Secondary | ICD-10-CM | POA: Diagnosis not present

## 2022-02-17 DIAGNOSIS — K219 Gastro-esophageal reflux disease without esophagitis: Secondary | ICD-10-CM | POA: Diagnosis not present

## 2022-02-17 DIAGNOSIS — E1165 Type 2 diabetes mellitus with hyperglycemia: Secondary | ICD-10-CM | POA: Diagnosis not present

## 2022-04-26 DIAGNOSIS — I10 Essential (primary) hypertension: Secondary | ICD-10-CM | POA: Diagnosis not present

## 2022-04-26 DIAGNOSIS — R0789 Other chest pain: Secondary | ICD-10-CM | POA: Diagnosis not present

## 2022-05-18 ENCOUNTER — Other Ambulatory Visit (HOSPITAL_COMMUNITY): Payer: Self-pay | Admitting: Family Medicine

## 2022-05-18 ENCOUNTER — Other Ambulatory Visit: Payer: Self-pay | Admitting: Family Medicine

## 2022-05-18 DIAGNOSIS — M25519 Pain in unspecified shoulder: Secondary | ICD-10-CM

## 2022-05-18 DIAGNOSIS — R0789 Other chest pain: Secondary | ICD-10-CM

## 2022-05-18 DIAGNOSIS — E119 Type 2 diabetes mellitus without complications: Secondary | ICD-10-CM | POA: Diagnosis not present

## 2022-05-20 DIAGNOSIS — E782 Mixed hyperlipidemia: Secondary | ICD-10-CM | POA: Diagnosis not present

## 2022-05-20 DIAGNOSIS — K219 Gastro-esophageal reflux disease without esophagitis: Secondary | ICD-10-CM | POA: Diagnosis not present

## 2022-05-20 DIAGNOSIS — E1165 Type 2 diabetes mellitus with hyperglycemia: Secondary | ICD-10-CM | POA: Diagnosis not present

## 2022-05-20 DIAGNOSIS — I10 Essential (primary) hypertension: Secondary | ICD-10-CM | POA: Diagnosis not present

## 2022-05-28 ENCOUNTER — Ambulatory Visit (HOSPITAL_COMMUNITY)
Admission: RE | Admit: 2022-05-28 | Discharge: 2022-05-28 | Disposition: A | Discharge status: Home / Self Care | Payer: Medicare Other | Source: Ambulatory Visit | Attending: Family Medicine | Admitting: Family Medicine

## 2022-05-28 DIAGNOSIS — M25519 Pain in unspecified shoulder: Secondary | ICD-10-CM | POA: Insufficient documentation

## 2022-05-28 DIAGNOSIS — R0789 Other chest pain: Secondary | ICD-10-CM | POA: Insufficient documentation

## 2022-05-28 DIAGNOSIS — R222 Localized swelling, mass and lump, trunk: Secondary | ICD-10-CM | POA: Diagnosis not present

## 2022-06-19 DIAGNOSIS — E782 Mixed hyperlipidemia: Secondary | ICD-10-CM | POA: Diagnosis not present

## 2022-06-19 DIAGNOSIS — I10 Essential (primary) hypertension: Secondary | ICD-10-CM | POA: Diagnosis not present

## 2022-06-19 DIAGNOSIS — K219 Gastro-esophageal reflux disease without esophagitis: Secondary | ICD-10-CM | POA: Diagnosis not present

## 2022-06-19 DIAGNOSIS — E1165 Type 2 diabetes mellitus with hyperglycemia: Secondary | ICD-10-CM | POA: Diagnosis not present

## 2022-06-20 IMAGING — CT CT ABDOMEN W/ CM
2 of 5 series · 15 of 46 positions shown, 17 images · IV contrast (omnipaque)
Comparison: CT October 31, 2020

CLINICAL DATA: Epigastric/mid abdominal pain times 2-3 months.
Intermittent constipation and diarrhea.

EXAM:
CT ABDOMEN WITH CONTRAST
TECHNIQUE: Multidetector CT imaging of the abdomen was performed using the
standard protocol following bolus administration of intravenous
contrast.
CONTRAST:  100mL OMNIPAQUE IOHEXOL 300 MG/ML  SOLN

[Series 2: axial st · axial · 0.68mm/px · z∈[+1054,+1279]mm · 12 of 55 slices shown, 14 images]
[im 5/55  soft-tissue]
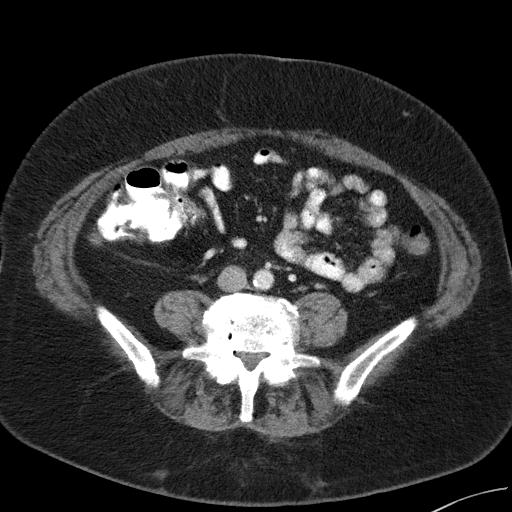
[im 5/55  bone]
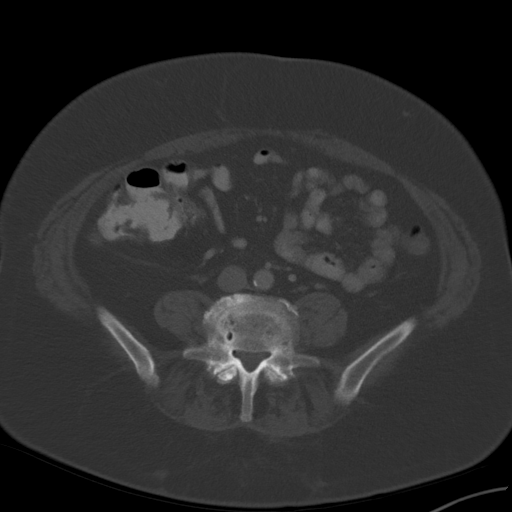
[im 9/55  soft-tissue]
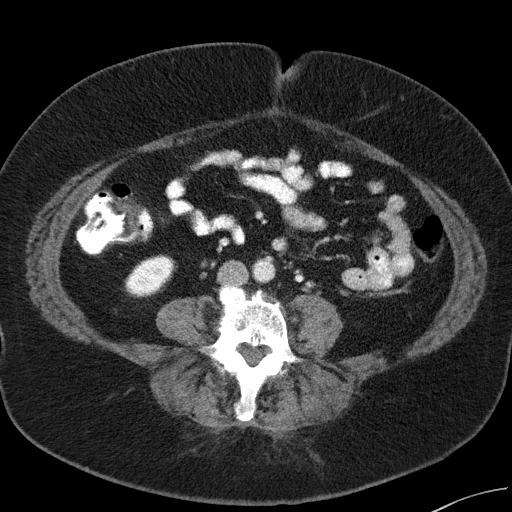
[im 13/55  soft-tissue]
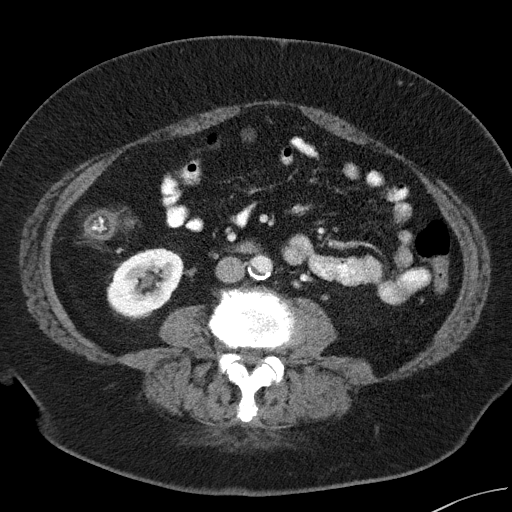
[im 17/55  soft-tissue]
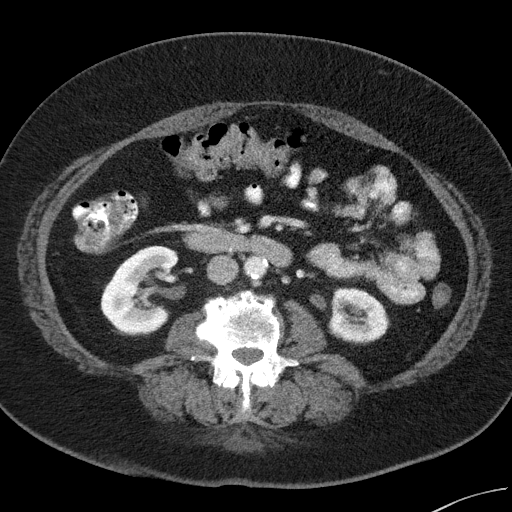
[im 21/55  soft-tissue]
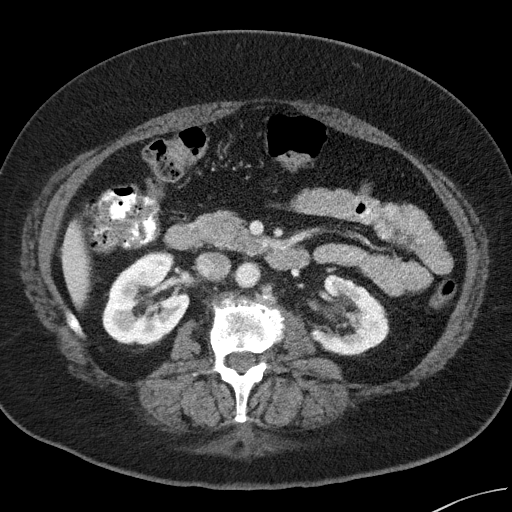
[im 25/55  soft-tissue]
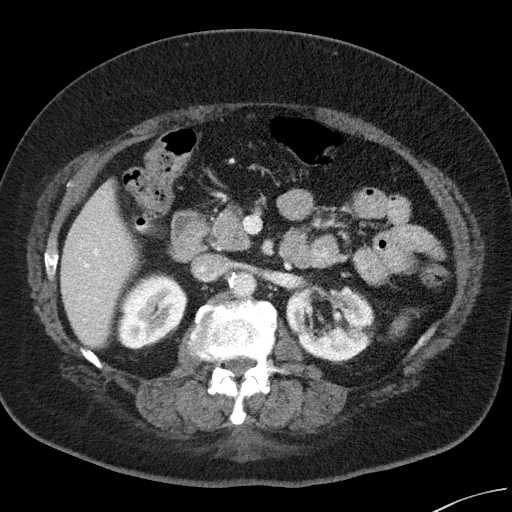
[im 30/55  soft-tissue]
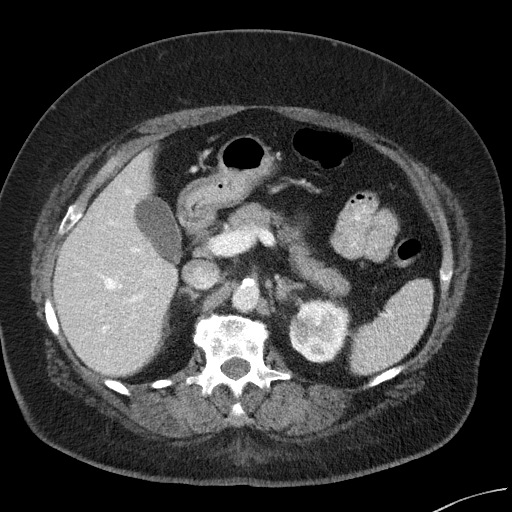
[im 34/55  soft-tissue]
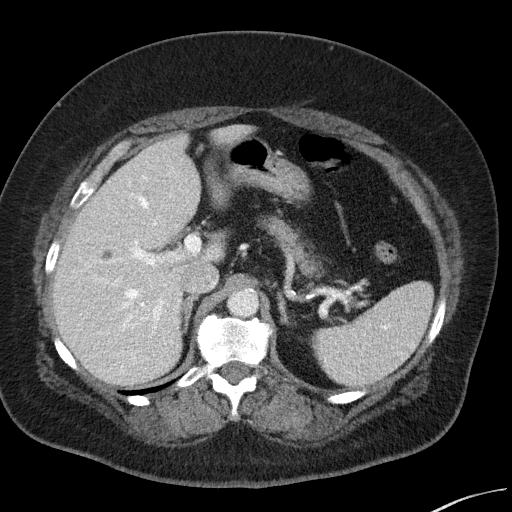
[im 38/55  soft-tissue]
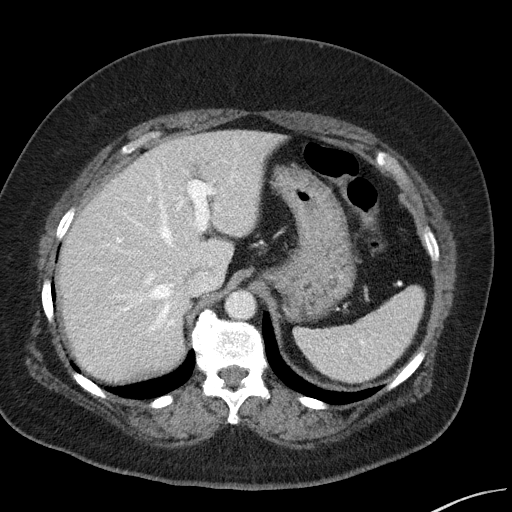
[im 38/55  bone]
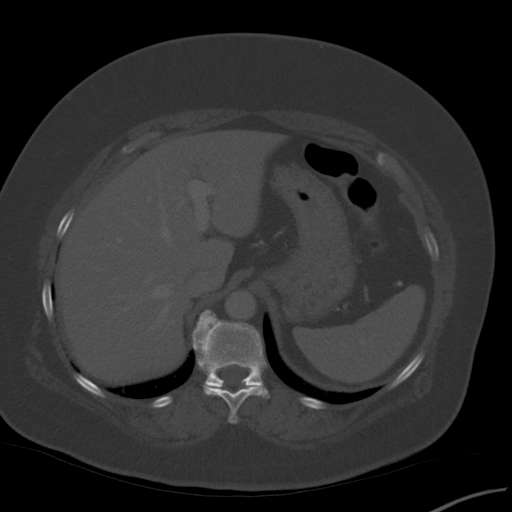
[im 42/55  soft-tissue]
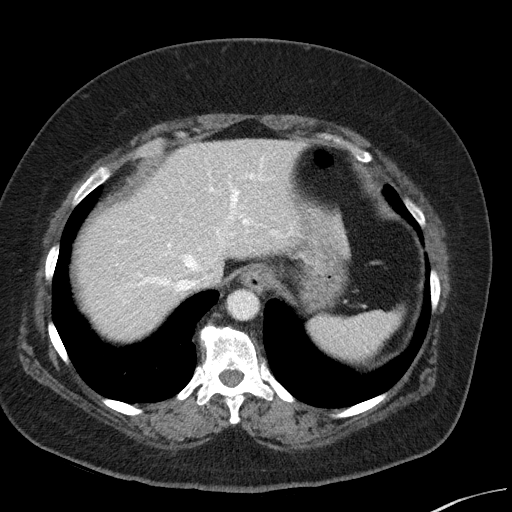
[im 46/55  soft-tissue]
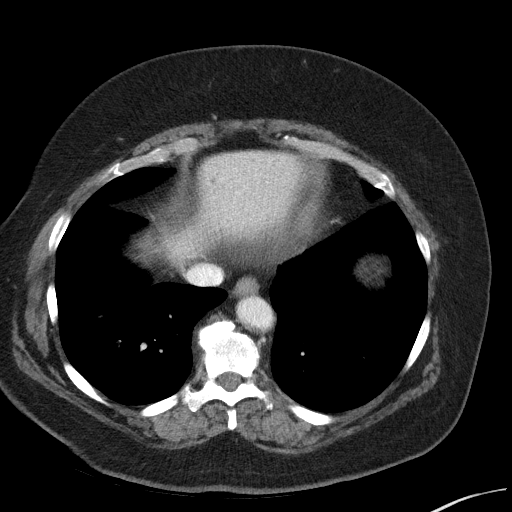
[im 50/55  soft-tissue]
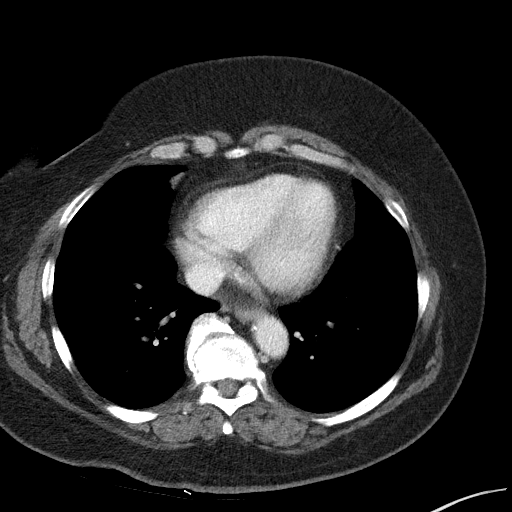

[Series 5: coronal st · coronal · 0.56mm/px · 3 of 117 slices shown]
[im 39/117  soft-tissue]
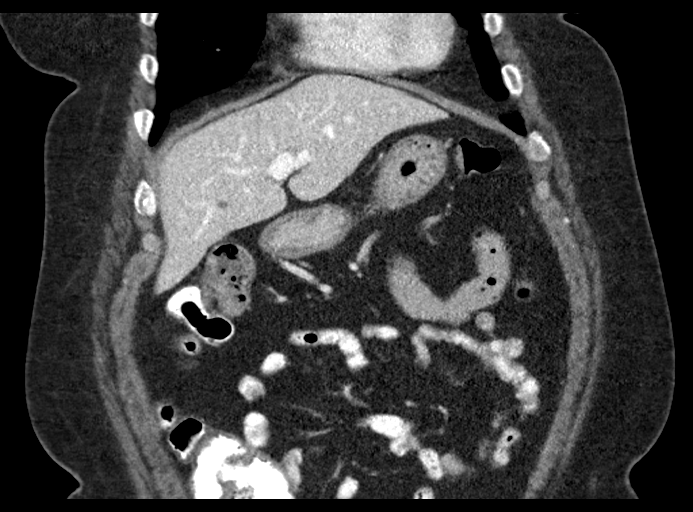
[im 52/117  soft-tissue]
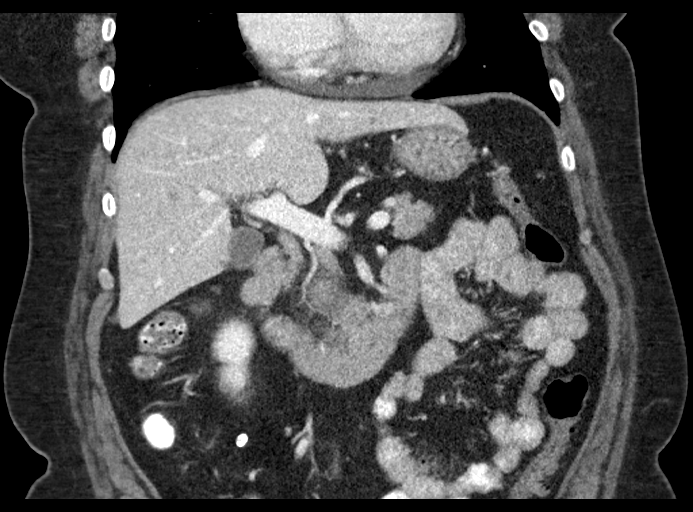
[im 65/117  soft-tissue]
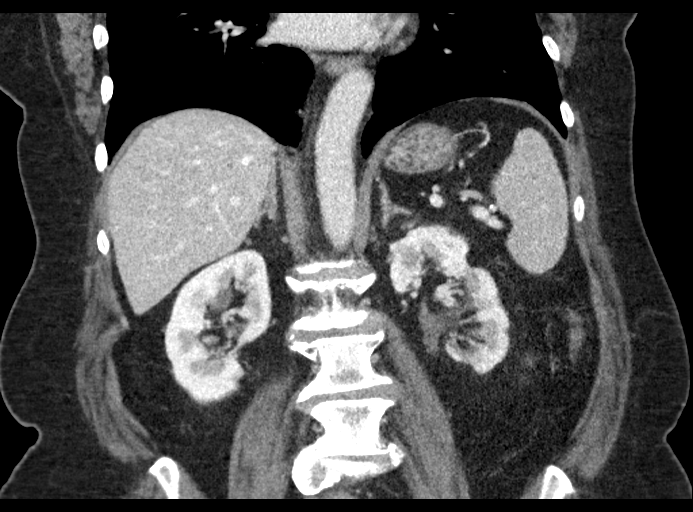

[15 of 46 positions shown; findings below may reference images not displayed]

FINDINGS: Lower chest: No acute abnormality.  Small hiatal hernia.

Hepatobiliary: A few bilobar subcentimeter foci are too small to
accurately characterize but stable from prior study and favored to
reflect a benign etiology such as cysts or hemangiomas. No solid
enhancing hepatic lesion. Gallbladder is unremarkable. No biliary
ductal dilation.

Pancreas: No pancreatic ductal dilation or evidence of acute
inflammation.

Spleen: Within normal limits.

Adrenals/Urinary Tract: Stable lobular thickening of the adrenal
glands. No hydronephrosis. Left cortical renal scarring. No solid
enhancing renal mass.

Stomach/Bowel: Radiopaque enteric contrast traverses the ascending
colon. Small hiatal hernia. Stomach is decompressed limiting
evaluation. Pathologic dilation or evidence of acute inflammation
involving loops of small large bowel in abdomen. Fall focal area of
luminal narrowing with wall thickening extending approximately
cm on coronal image [DATE] across the mid transverse colon which may
represent an area of peristalsis although underlying mass lesion not
excluded.

Vascular/Lymphatic: Aortic atherosclerosis without abdominal aortic
aneurysm. No pathologically enlarged abdominal lymph nodes.

Other: No abdominal ascites.

Musculoskeletal: Multilevel degenerative changes spine. No acute
osseous abnormality.
IMPRESSION: 1. Focal area of luminal narrowing with wall thickening extending
approximately 3.6 cm across the mid transverse colon which may
represent an area of peristalsis although underlying mass lesion not
excluded. Consider correlation with colonoscopy if clinically
indicated and not recently performed.
2. Extensive left renal cortical scarring.
3. Small hiatal hernia.
4. Aortic Atherosclerosis (THC1N-Y5H.H).

## 2022-07-06 DIAGNOSIS — I10 Essential (primary) hypertension: Secondary | ICD-10-CM | POA: Diagnosis not present

## 2022-07-06 DIAGNOSIS — E119 Type 2 diabetes mellitus without complications: Secondary | ICD-10-CM | POA: Diagnosis not present

## 2022-07-06 DIAGNOSIS — E782 Mixed hyperlipidemia: Secondary | ICD-10-CM | POA: Diagnosis not present

## 2022-07-27 DIAGNOSIS — E782 Mixed hyperlipidemia: Secondary | ICD-10-CM | POA: Diagnosis not present

## 2022-07-27 DIAGNOSIS — I10 Essential (primary) hypertension: Secondary | ICD-10-CM | POA: Diagnosis not present

## 2022-07-27 DIAGNOSIS — R7401 Elevation of levels of liver transaminase levels: Secondary | ICD-10-CM | POA: Diagnosis not present

## 2022-07-27 DIAGNOSIS — R911 Solitary pulmonary nodule: Secondary | ICD-10-CM | POA: Diagnosis not present

## 2022-07-27 DIAGNOSIS — Z0000 Encounter for general adult medical examination without abnormal findings: Secondary | ICD-10-CM | POA: Diagnosis not present

## 2022-07-27 DIAGNOSIS — N3281 Overactive bladder: Secondary | ICD-10-CM | POA: Diagnosis not present

## 2022-07-27 DIAGNOSIS — G894 Chronic pain syndrome: Secondary | ICD-10-CM | POA: Diagnosis not present

## 2022-07-27 DIAGNOSIS — E1165 Type 2 diabetes mellitus with hyperglycemia: Secondary | ICD-10-CM | POA: Diagnosis not present

## 2022-07-27 DIAGNOSIS — K219 Gastro-esophageal reflux disease without esophagitis: Secondary | ICD-10-CM | POA: Diagnosis not present

## 2022-08-11 DIAGNOSIS — M79642 Pain in left hand: Secondary | ICD-10-CM | POA: Diagnosis not present

## 2022-08-11 DIAGNOSIS — M25831 Other specified joint disorders, right wrist: Secondary | ICD-10-CM | POA: Diagnosis not present

## 2022-08-11 DIAGNOSIS — M25832 Other specified joint disorders, left wrist: Secondary | ICD-10-CM | POA: Diagnosis not present

## 2022-08-11 DIAGNOSIS — M79641 Pain in right hand: Secondary | ICD-10-CM | POA: Diagnosis not present

## 2022-08-11 DIAGNOSIS — G5603 Carpal tunnel syndrome, bilateral upper limbs: Secondary | ICD-10-CM | POA: Diagnosis not present

## 2022-09-03 DIAGNOSIS — J069 Acute upper respiratory infection, unspecified: Secondary | ICD-10-CM | POA: Diagnosis not present

## 2022-11-15 DIAGNOSIS — J988 Other specified respiratory disorders: Secondary | ICD-10-CM | POA: Diagnosis not present

## 2022-11-15 DIAGNOSIS — F17210 Nicotine dependence, cigarettes, uncomplicated: Secondary | ICD-10-CM | POA: Diagnosis not present

## 2022-11-15 DIAGNOSIS — R059 Cough, unspecified: Secondary | ICD-10-CM | POA: Diagnosis not present

## 2022-11-25 DIAGNOSIS — H905 Unspecified sensorineural hearing loss: Secondary | ICD-10-CM | POA: Diagnosis not present

## 2022-11-27 DIAGNOSIS — K5730 Diverticulosis of large intestine without perforation or abscess without bleeding: Secondary | ICD-10-CM | POA: Diagnosis not present

## 2022-11-27 DIAGNOSIS — R1084 Generalized abdominal pain: Secondary | ICD-10-CM | POA: Diagnosis not present

## 2022-11-27 DIAGNOSIS — N390 Urinary tract infection, site not specified: Secondary | ICD-10-CM | POA: Diagnosis not present

## 2022-11-27 DIAGNOSIS — R109 Unspecified abdominal pain: Secondary | ICD-10-CM | POA: Diagnosis not present

## 2022-11-27 DIAGNOSIS — F17200 Nicotine dependence, unspecified, uncomplicated: Secondary | ICD-10-CM | POA: Diagnosis not present

## 2022-11-27 DIAGNOSIS — N2889 Other specified disorders of kidney and ureter: Secondary | ICD-10-CM | POA: Diagnosis not present

## 2022-11-27 DIAGNOSIS — Z743 Need for continuous supervision: Secondary | ICD-10-CM | POA: Diagnosis not present

## 2022-11-27 DIAGNOSIS — K529 Noninfective gastroenteritis and colitis, unspecified: Secondary | ICD-10-CM | POA: Diagnosis not present

## 2022-12-08 DIAGNOSIS — N390 Urinary tract infection, site not specified: Secondary | ICD-10-CM | POA: Diagnosis not present

## 2022-12-08 DIAGNOSIS — K529 Noninfective gastroenteritis and colitis, unspecified: Secondary | ICD-10-CM | POA: Diagnosis not present

## 2022-12-20 DIAGNOSIS — F17210 Nicotine dependence, cigarettes, uncomplicated: Secondary | ICD-10-CM | POA: Diagnosis not present

## 2022-12-20 DIAGNOSIS — K5900 Constipation, unspecified: Secondary | ICD-10-CM | POA: Diagnosis not present

## 2022-12-20 DIAGNOSIS — L309 Dermatitis, unspecified: Secondary | ICD-10-CM | POA: Diagnosis not present

## 2022-12-20 DIAGNOSIS — R109 Unspecified abdominal pain: Secondary | ICD-10-CM | POA: Diagnosis not present

## 2022-12-20 DIAGNOSIS — E876 Hypokalemia: Secondary | ICD-10-CM | POA: Diagnosis not present

## 2022-12-22 ENCOUNTER — Other Ambulatory Visit: Payer: Self-pay | Admitting: Orthopedic Surgery

## 2022-12-22 DIAGNOSIS — G5622 Lesion of ulnar nerve, left upper limb: Secondary | ICD-10-CM | POA: Diagnosis not present

## 2022-12-22 DIAGNOSIS — G5603 Carpal tunnel syndrome, bilateral upper limbs: Secondary | ICD-10-CM | POA: Diagnosis not present

## 2022-12-22 DIAGNOSIS — G5623 Lesion of ulnar nerve, bilateral upper limbs: Secondary | ICD-10-CM | POA: Diagnosis not present

## 2022-12-28 DIAGNOSIS — L293 Anogenital pruritus, unspecified: Secondary | ICD-10-CM | POA: Diagnosis not present

## 2022-12-28 DIAGNOSIS — L298 Other pruritus: Secondary | ICD-10-CM | POA: Diagnosis not present

## 2022-12-28 DIAGNOSIS — K5900 Constipation, unspecified: Secondary | ICD-10-CM | POA: Diagnosis not present

## 2022-12-28 DIAGNOSIS — Z79899 Other long term (current) drug therapy: Secondary | ICD-10-CM | POA: Diagnosis not present

## 2022-12-28 DIAGNOSIS — R600 Localized edema: Secondary | ICD-10-CM | POA: Diagnosis not present

## 2022-12-31 ENCOUNTER — Other Ambulatory Visit: Payer: Self-pay

## 2022-12-31 ENCOUNTER — Encounter (HOSPITAL_BASED_OUTPATIENT_CLINIC_OR_DEPARTMENT_OTHER): Payer: Self-pay | Admitting: Orthopedic Surgery

## 2023-01-04 ENCOUNTER — Encounter (HOSPITAL_BASED_OUTPATIENT_CLINIC_OR_DEPARTMENT_OTHER)
Admission: RE | Admit: 2023-01-04 | Discharge: 2023-01-04 | Disposition: A | Discharge status: Home / Self Care | Payer: 59 | Source: Ambulatory Visit | Attending: Orthopedic Surgery | Admitting: Orthopedic Surgery

## 2023-01-04 DIAGNOSIS — Z01810 Encounter for preprocedural cardiovascular examination: Secondary | ICD-10-CM | POA: Insufficient documentation

## 2023-01-04 NOTE — Progress Notes (Signed)

## 2023-01-06 ENCOUNTER — Ambulatory Visit (HOSPITAL_BASED_OUTPATIENT_CLINIC_OR_DEPARTMENT_OTHER): Payer: 59 | Admitting: Anesthesiology

## 2023-01-06 ENCOUNTER — Encounter (HOSPITAL_BASED_OUTPATIENT_CLINIC_OR_DEPARTMENT_OTHER): Payer: Self-pay | Admitting: Orthopedic Surgery

## 2023-01-06 ENCOUNTER — Other Ambulatory Visit: Payer: Self-pay

## 2023-01-06 ENCOUNTER — Ambulatory Visit (HOSPITAL_BASED_OUTPATIENT_CLINIC_OR_DEPARTMENT_OTHER)
Admission: RE | Admit: 2023-01-06 | Discharge: 2023-01-06 | Disposition: A | Discharge status: Home / Self Care | Payer: 59 | Attending: Orthopedic Surgery | Admitting: Orthopedic Surgery

## 2023-01-06 ENCOUNTER — Encounter (HOSPITAL_BASED_OUTPATIENT_CLINIC_OR_DEPARTMENT_OTHER)
Admission: RE | Disposition: A | Discharge status: Home / Self Care | Payer: Self-pay | Source: Home / Self Care | Attending: Orthopedic Surgery

## 2023-01-06 DIAGNOSIS — M1990 Unspecified osteoarthritis, unspecified site: Secondary | ICD-10-CM | POA: Insufficient documentation

## 2023-01-06 DIAGNOSIS — I10 Essential (primary) hypertension: Secondary | ICD-10-CM | POA: Diagnosis not present

## 2023-01-06 DIAGNOSIS — Z79899 Other long term (current) drug therapy: Secondary | ICD-10-CM | POA: Diagnosis not present

## 2023-01-06 DIAGNOSIS — E7800 Pure hypercholesterolemia, unspecified: Secondary | ICD-10-CM | POA: Diagnosis not present

## 2023-01-06 DIAGNOSIS — E119 Type 2 diabetes mellitus without complications: Secondary | ICD-10-CM | POA: Insufficient documentation

## 2023-01-06 DIAGNOSIS — F32A Depression, unspecified: Secondary | ICD-10-CM | POA: Diagnosis not present

## 2023-01-06 DIAGNOSIS — K760 Fatty (change of) liver, not elsewhere classified: Secondary | ICD-10-CM | POA: Diagnosis not present

## 2023-01-06 DIAGNOSIS — J449 Chronic obstructive pulmonary disease, unspecified: Secondary | ICD-10-CM

## 2023-01-06 DIAGNOSIS — G5622 Lesion of ulnar nerve, left upper limb: Secondary | ICD-10-CM | POA: Insufficient documentation

## 2023-01-06 DIAGNOSIS — G5602 Carpal tunnel syndrome, left upper limb: Secondary | ICD-10-CM | POA: Insufficient documentation

## 2023-01-06 DIAGNOSIS — F17210 Nicotine dependence, cigarettes, uncomplicated: Secondary | ICD-10-CM | POA: Diagnosis not present

## 2023-01-06 DIAGNOSIS — E669 Obesity, unspecified: Secondary | ICD-10-CM | POA: Diagnosis not present

## 2023-01-06 DIAGNOSIS — E785 Hyperlipidemia, unspecified: Secondary | ICD-10-CM | POA: Insufficient documentation

## 2023-01-06 DIAGNOSIS — F419 Anxiety disorder, unspecified: Secondary | ICD-10-CM | POA: Insufficient documentation

## 2023-01-06 DIAGNOSIS — K219 Gastro-esophageal reflux disease without esophagitis: Secondary | ICD-10-CM | POA: Insufficient documentation

## 2023-01-06 DIAGNOSIS — Z6828 Body mass index (BMI) 28.0-28.9, adult: Secondary | ICD-10-CM | POA: Diagnosis not present

## 2023-01-06 DIAGNOSIS — M797 Fibromyalgia: Secondary | ICD-10-CM | POA: Insufficient documentation

## 2023-01-06 DIAGNOSIS — F17200 Nicotine dependence, unspecified, uncomplicated: Secondary | ICD-10-CM | POA: Diagnosis not present

## 2023-01-06 HISTORY — DX: Type 2 diabetes mellitus without complications: E11.9

## 2023-01-06 HISTORY — PX: ANTERIOR INTEROSSEOUS NERVE DECOMPRESSION: SHX5735

## 2023-01-06 HISTORY — PX: CARPAL TUNNEL RELEASE: SHX101

## 2023-01-06 LAB — GLUCOSE, CAPILLARY: Glucose-Capillary: 86 mg/dL (ref 70–99)

## 2023-01-06 SURGERY — CARPAL TUNNEL RELEASE
Anesthesia: General | Site: Wrist | Laterality: Left

## 2023-01-06 MED ORDER — BUPIVACAINE HCL (PF) 0.25 % IJ SOLN
INTRAMUSCULAR | Status: DC | PRN
  Administered 2023-01-06: 9 mL

## 2023-01-06 MED ORDER — PHENYLEPHRINE 80 MCG/ML (10ML) SYRINGE FOR IV PUSH (FOR BLOOD PRESSURE SUPPORT)
PREFILLED_SYRINGE | INTRAVENOUS | Status: AC
  Filled 2023-01-06: qty 10

## 2023-01-06 MED ORDER — FENTANYL CITRATE (PF) 100 MCG/2ML IJ SOLN
INTRAMUSCULAR | Status: AC
  Filled 2023-01-06: qty 2

## 2023-01-06 MED ORDER — LACTATED RINGERS IV SOLN: INTRAVENOUS | Status: DC

## 2023-01-06 MED ORDER — ACETAMINOPHEN 10 MG/ML IV SOLN
1000.0000 mg | Freq: Once | INTRAVENOUS | Status: DC | PRN
  Administered 2023-01-06: 1000 mg via INTRAVENOUS

## 2023-01-06 MED ORDER — DEXAMETHASONE SODIUM PHOSPHATE 10 MG/ML IJ SOLN
INTRAMUSCULAR | Status: DC | PRN
  Administered 2023-01-06: 5 mg via INTRAVENOUS

## 2023-01-06 MED ORDER — CEFAZOLIN SODIUM-DEXTROSE 2-4 GM/100ML-% IV SOLN
2.0000 g | INTRAVENOUS | Status: AC
  Administered 2023-01-06: 2 g via INTRAVENOUS

## 2023-01-06 MED ORDER — ACETAMINOPHEN 160 MG/5ML PO SOLN: 1000.0000 mg | Freq: Once | ORAL | Status: DC | PRN

## 2023-01-06 MED ORDER — OXYCODONE HCL 5 MG PO TABS: 5.0000 mg | ORAL_TABLET | Freq: Once | ORAL | Status: DC | PRN

## 2023-01-06 MED ORDER — 0.9 % SODIUM CHLORIDE (POUR BTL) OPTIME
TOPICAL | Status: DC | PRN
  Administered 2023-01-06: 200 mL

## 2023-01-06 MED ORDER — ONDANSETRON HCL 4 MG/2ML IJ SOLN
INTRAMUSCULAR | Status: AC
  Filled 2023-01-06: qty 2

## 2023-01-06 MED ORDER — MIDAZOLAM HCL 2 MG/2ML IJ SOLN
INTRAMUSCULAR | Status: AC
  Filled 2023-01-06: qty 2

## 2023-01-06 MED ORDER — EPHEDRINE 5 MG/ML INJ
INTRAVENOUS | Status: AC
  Filled 2023-01-06: qty 5

## 2023-01-06 MED ORDER — ATROPINE SULFATE 0.4 MG/ML IV SOLN
INTRAVENOUS | Status: AC
  Filled 2023-01-06: qty 1

## 2023-01-06 MED ORDER — PROPOFOL 10 MG/ML IV BOLUS
INTRAVENOUS | Status: DC | PRN
  Administered 2023-01-06: 150 mg via INTRAVENOUS

## 2023-01-06 MED ORDER — SUCCINYLCHOLINE CHLORIDE 200 MG/10ML IV SOSY
PREFILLED_SYRINGE | INTRAVENOUS | Status: AC
  Filled 2023-01-06: qty 10

## 2023-01-06 MED ORDER — HYDROCODONE-ACETAMINOPHEN 5-325 MG PO TABS: ORAL_TABLET | ORAL | 0 refills | Status: DC

## 2023-01-06 MED ORDER — MIDAZOLAM HCL 5 MG/5ML IJ SOLN
INTRAMUSCULAR | Status: DC | PRN
  Administered 2023-01-06 (×2): 1 mg via INTRAVENOUS

## 2023-01-06 MED ORDER — CEFAZOLIN SODIUM-DEXTROSE 2-4 GM/100ML-% IV SOLN
INTRAVENOUS | Status: AC
  Filled 2023-01-06: qty 100

## 2023-01-06 MED ORDER — LIDOCAINE 2% (20 MG/ML) 5 ML SYRINGE
INTRAMUSCULAR | Status: AC
  Filled 2023-01-06: qty 5

## 2023-01-06 MED ORDER — BUPIVACAINE HCL (PF) 0.25 % IJ SOLN
INTRAMUSCULAR | Status: AC
  Filled 2023-01-06: qty 30

## 2023-01-06 MED ORDER — ACETAMINOPHEN 500 MG PO TABS: 1000.0000 mg | ORAL_TABLET | Freq: Once | ORAL | Status: DC | PRN

## 2023-01-06 MED ORDER — LIDOCAINE HCL (CARDIAC) PF 100 MG/5ML IV SOSY
PREFILLED_SYRINGE | INTRAVENOUS | Status: DC | PRN
  Administered 2023-01-06: 60 mg via INTRAVENOUS

## 2023-01-06 MED ORDER — OXYCODONE HCL 5 MG/5ML PO SOLN: 5.0000 mg | Freq: Once | ORAL | Status: DC | PRN

## 2023-01-06 MED ORDER — DEXAMETHASONE SODIUM PHOSPHATE 10 MG/ML IJ SOLN
INTRAMUSCULAR | Status: AC
  Filled 2023-01-06: qty 1

## 2023-01-06 MED ORDER — ONDANSETRON HCL 4 MG/2ML IJ SOLN
INTRAMUSCULAR | Status: DC | PRN
  Administered 2023-01-06: 4 mg via INTRAVENOUS

## 2023-01-06 MED ORDER — FENTANYL CITRATE (PF) 100 MCG/2ML IJ SOLN
25.0000 ug | INTRAMUSCULAR | Status: DC | PRN
  Administered 2023-01-06: 25 ug via INTRAVENOUS

## 2023-01-06 MED ORDER — ACETAMINOPHEN 10 MG/ML IV SOLN
INTRAVENOUS | Status: AC
  Filled 2023-01-06: qty 100

## 2023-01-06 MED ORDER — FENTANYL CITRATE (PF) 100 MCG/2ML IJ SOLN
INTRAMUSCULAR | Status: DC | PRN
  Administered 2023-01-06 (×2): 50 ug via INTRAVENOUS

## 2023-01-06 SURGICAL SUPPLY — 53 items
APL PRP STRL LF DISP 70% ISPRP (MISCELLANEOUS) ×2
BLADE MINI RND TIP GREEN BEAV (BLADE) IMPLANT
BLADE SURG 15 STRL LF DISP TIS (BLADE) ×4 IMPLANT
BLADE SURG 15 STRL SS (BLADE) ×4
BNDG CMPR 5X3 KNIT ELC UNQ LF (GAUZE/BANDAGES/DRESSINGS) ×4
BNDG CMPR 5X4 KNIT ELC UNQ LF (GAUZE/BANDAGES/DRESSINGS) ×2
BNDG CMPR 9X4 STRL LF SNTH (GAUZE/BANDAGES/DRESSINGS) ×2
BNDG ELASTIC 3INX 5YD STR LF (GAUZE/BANDAGES/DRESSINGS) ×4 IMPLANT
BNDG ELASTIC 4INX 5YD STR LF (GAUZE/BANDAGES/DRESSINGS) ×2 IMPLANT
BNDG ESMARK 4X9 LF (GAUZE/BANDAGES/DRESSINGS) ×2 IMPLANT
BNDG GAUZE DERMACEA FLUFF 4 (GAUZE/BANDAGES/DRESSINGS) ×2 IMPLANT
BNDG GZE DERMACEA 4 6PLY (GAUZE/BANDAGES/DRESSINGS) ×2
CHLORAPREP W/TINT 26 (MISCELLANEOUS) ×2 IMPLANT
CORD BIPOLAR FORCEPS 12FT (ELECTRODE) ×2 IMPLANT
COVER BACK TABLE 60X90IN (DRAPES) ×2 IMPLANT
COVER MAYO STAND STRL (DRAPES) ×2 IMPLANT
CUFF TOURN SGL QUICK 18X3 (MISCELLANEOUS) ×2 IMPLANT
CUFF TOURN SGL QUICK 18X4 (TOURNIQUET CUFF) ×2 IMPLANT
DRAPE EXTREMITY T 121X128X90 (DISPOSABLE) ×2 IMPLANT
DRAPE SURG 17X23 STRL (DRAPES) ×2 IMPLANT
GAUZE 4X4 16PLY ~~LOC~~+RFID DBL (SPONGE) IMPLANT
GAUZE PAD ABD 8X10 STRL (GAUZE/BANDAGES/DRESSINGS) ×2 IMPLANT
GAUZE SPONGE 4X4 12PLY STRL (GAUZE/BANDAGES/DRESSINGS) ×2 IMPLANT
GAUZE XEROFORM 1X8 LF (GAUZE/BANDAGES/DRESSINGS) ×2 IMPLANT
GLOVE BIO SURGEON STRL SZ7.5 (GLOVE) ×2 IMPLANT
GLOVE BIOGEL PI IND STRL 8 (GLOVE) ×2 IMPLANT
GLOVE BIOGEL PI IND STRL 8.5 (GLOVE) ×2 IMPLANT
GLOVE SURG ORTHO 8.0 STRL STRW (GLOVE) ×2 IMPLANT
GOWN STRL REUS W/ TWL LRG LVL3 (GOWN DISPOSABLE) ×2 IMPLANT
GOWN STRL REUS W/TWL LRG LVL3 (GOWN DISPOSABLE) ×2
GOWN STRL REUS W/TWL XL LVL3 (GOWN DISPOSABLE) ×4 IMPLANT
NDL HYPO 25X1 1.5 SAFETY (NEEDLE) ×2 IMPLANT
NEEDLE HYPO 25X1 1.5 SAFETY (NEEDLE) ×2 IMPLANT
NS IRRIG 1000ML POUR BTL (IV SOLUTION) ×2 IMPLANT
PACK BASIN DAY SURGERY FS (CUSTOM PROCEDURE TRAY) ×2 IMPLANT
PAD CAST 3X4 CTTN HI CHSV (CAST SUPPLIES) ×2 IMPLANT
PAD CAST 4YDX4 CTTN HI CHSV (CAST SUPPLIES) ×2 IMPLANT
PADDING CAST ABS COTTON 4X4 ST (CAST SUPPLIES) ×2 IMPLANT
PADDING CAST COTTON 3X4 STRL (CAST SUPPLIES) ×2
PADDING CAST COTTON 4X4 STRL (CAST SUPPLIES) ×2
SLEEVE SCD COMPRESS KNEE MED (STOCKING) ×2 IMPLANT
SPIKE FLUID TRANSFER (MISCELLANEOUS) IMPLANT
SPLINT PLASTER CAST FAST 5X30 (CAST SUPPLIES) IMPLANT
SPLINT PLASTER CAST XFAST 3X15 (CAST SUPPLIES) IMPLANT
STOCKINETTE 4X48 STRL (DRAPES) ×2 IMPLANT
SUT ETHILON 4 0 PS 2 18 (SUTURE) ×2 IMPLANT
SUT VIC AB 2-0 SH 27 (SUTURE) ×2
SUT VIC AB 2-0 SH 27XBRD (SUTURE) ×2 IMPLANT
SUT VICRYL 4-0 PS2 18IN ABS (SUTURE) IMPLANT
SYR BULB EAR ULCER 3OZ GRN STR (SYRINGE) ×2 IMPLANT
SYR CONTROL 10ML LL (SYRINGE) ×2 IMPLANT
TOWEL GREEN STERILE FF (TOWEL DISPOSABLE) ×4 IMPLANT
UNDERPAD 30X36 HEAVY ABSORB (UNDERPADS AND DIAPERS) ×2 IMPLANT

## 2023-01-06 NOTE — Op Note (Signed)
01/06/2023 [facility]                              OPERATIVE REPORT   PREOPERATIVE DIAGNOSIS:   1.  Left carpal tunnel syndrome 2.  Left ulnar nerve compression at the elbow  POSTOPERATIVE DIAGNOSIS: 1.  Left carpal tunnel syndrome 2.  Left ulnar nerve compression at the elbow  PROCEDURE: 1.  Left carpal tunnel release 2.  Left ulnar nerve decompression at the elbow  SURGEON:  [name], MD  ASSISTANT: Cindee Salt, MD.  ANESTHESIA: General  IV FLUIDS:  Per anesthesia flow sheet.  ESTIMATED BLOOD LOSS:  Minimal.  COMPLICATIONS:  None.  SPECIMENS:  None.  TOURNIQUET TIME:    Total Tourniquet Time Documented: Upper Arm (Left) - 34 minutes Total: Upper Arm (Left) - 34 minutes   DISPOSITION:  Stable to PACU.  LOCATION: [facility]  INDICATIONS:  62 y.o. yo female with numbness and tingling left hand.  Positive nerve conduction studies. She wishes to have left carpal tunnel release and ulnar nerve decompression at the elbow with possible transposition.  Risks, benefits and alternatives of surgery were discussed including the risk of blood loss; infection; damage to nerves, vessels, tendons, ligaments, bone; failure of surgery; need for additional surgery; complications with wound healing; continued pain; recurrence of carpal tunnel syndrome; and damage to motor branch. She voiced understanding of these risks and elected to proceed.   OPERATIVE COURSE:  After being identified preoperatively by myself, the patient and I agreed upon the procedure and site of procedure.  The surgical site was marked.  Surgical consent had been signed.  She was given IV Ancef as preoperative antibiotic prophylaxis.  She was transferred to the operating room and placed on the operating room table in supine position with the left upper extremity on an armboard.  General anesthesia was induced by the anesthesiologist.  Left upper extremity was prepped and draped in normal  sterile orthopaedic fashion.  A surgical pause was performed between the surgeons, anesthesia, and operating room staff, and all were in agreement as to the patient, procedure, and site of procedure.  Tourniquet at the proximal aspect of the extremity was inflated to 250 mmHg after exsanguination of the arm with an Esmarch bandage  Incision was made over the transverse carpal ligament and carried into the subcutaneous tissues by spreading technique.  Bipolar electrocautery was used to obtain hemostasis.  The palmar fascia was sharply incised.  The transverse carpal ligament was identified.  The fascia distal to the ligament was opened.  Retractor was placed and the flexor tendons were identified.  The flexor tendon to the ring finger was identified and retracted radially.  The transverse carpal ligament was then incised from distal to proximal under direct visualization.  Scissors were used to split the distal aspect of the volar antebrachial fascia.  A finger was placed into the wound to ensure complete decompression, which was the case.  The nerve was examined.  It was adherent to the radial leaflet.  The motor branch was identified and was intact.  The wound was copiously irrigated with sterile saline.  It was then closed with 4-0 nylon in a horizontal mattress fashion.  An incision was then made at the medial side of the elbow.  This was carried in subcutaneous tissues by spreading technique.  Bipolar electrocautery is used to obtain hemostasis.  The ulnar nerve was identified proximal to  Osborne's ligament.  Osborne's ligament was then sharply incised under direct visualization while protecting the ulnar nerve.  The nerve was traced underneath the FCU muscles.  The muscular fascia was released and the FCU muscle belly spread.  The motor branches were identified and protected.  The investing fascia over the nerve was then released under direct visualization while protecting the nerve with a KMI skid.  The nerve  was then decompressed proximally.  There was an accessory muscle over top which was released.  The muscular fascia was again released and the investing fascia over the nerve released under direct visualization while protecting the nerve with a KMI skid.  The elbow was flexed and the nerve did not subluxate out of the groove.  The wound was copiously irrigated with sterile saline.  The anterior leaflet of Osborne's ligament was repaired to the posterior subcutaneous tissues with a 2-0 Vicryl suture in a figure-of-eight fashion.  No compression was created.  Inverted interrupted 4-0 Vicryl sutures were placed in subcutaneous tissues and skin was closed with 4-0 nylon in a horizontal mattress fashion.  The wounds were injected with 0.25% plain Marcaine to aid in postoperative analgesia.  They were dressed with sterile Xeroform, 4x4s, an ABD, and wrapped with Kerlix and an Ace bandage.  Tourniquet was deflated at 34 minutes.  Fingertips were pink with brisk capillary refill after deflation of the tourniquet.  Operative drapes were broken down.  The patient was awoken from anesthesia safely.  She was transferred back to stretcher and taken to the PACU in stable condition.  I will see her back in the office in 1 week for postoperative followup.  I will give her a prescription for Norco 5/325 1-2 tabs PO q6 hours prn pain, dispense # 20.    [name], MD Electronically signed, 01/06/23

## 2023-01-06 NOTE — Anesthesia Preprocedure Evaluation (Addendum)
Anesthesia Evaluation  Patient identified by MRN, date of birth, ID band Patient awake    Reviewed: Allergy & Precautions, NPO status , Patient's Chart, lab work & pertinent test results  History of Anesthesia Complications Negative for: history of anesthetic complications  Airway Mallampati: I  TM Distance: >3 FB Neck ROM: Full    Dental  (+) Dental Advisory Given,    Pulmonary asthma , COPD,  COPD inhaler, Current SmokerPatient did not abstain from smoking.   breath sounds clear to auscultation       Cardiovascular hypertension, Pt. on medications (-) angina (-) Past MI and (-) CHF  Rhythm:Regular     Neuro/Psych  Headaches PSYCHIATRIC DISORDERS Anxiety Depression     Neuromuscular disease    GI/Hepatic Neg liver ROS,GERD  Medicated,,  Endo/Other  diabetes, Type 2    Renal/GU Lab Results      Component                Value               Date                      CREATININE               0.80                07/15/2021                Musculoskeletal  (+) Arthritis ,  Fibromyalgia -  Abdominal   Peds  Hematology negative hematology ROS (+)   Anesthesia Other Findings   Reproductive/Obstetrics                             Anesthesia Physical Anesthesia Plan  ASA: 2  Anesthesia Plan: General   Post-op Pain Management: Ofirmev IV (intra-op)* and Toradol IV (intra-op)*   Induction: Intravenous  PONV Risk Score and Plan: 2 and Ondansetron and Dexamethasone  Airway Management Planned: LMA  Additional Equipment: None  Intra-op Plan:   Post-operative Plan: Extubation in OR  Informed Consent: I have reviewed the patients History and Physical, chart, labs and discussed the procedure including the risks, benefits and alternatives for the proposed anesthesia with the patient or authorized representative who has indicated his/her understanding and acceptance.     Dental advisory  given  Plan Discussed with: CRNA  Anesthesia Plan Comments:        Anesthesia Quick Evaluation

## 2023-01-06 NOTE — Discharge Instructions (Addendum)
[facility] Instructions Hand Surgery  Wound Care: Keep your hand elevated above the level of your heart.  Do not allow it to dangle by your side.  Keep the dressing dry and do not remove it unless your doctor advises you to do so.  He will usually change it at the time of your post-op visit.  Moving your fingers is advised to stimulate circulation but will depend on the site of your surgery.  If you have a splint applied, your doctor will advise you regarding movement.  Activity: Do not drive or operate machinery today.  Rest today and then you may return to your normal activity and work as indicated by your physician.  Diet:  Drink liquids today or eat a light diet.  You may resume a regular diet tomorrow.    General expectations: Pain for two to three days. Fingers may become slightly swollen.  Call your doctor if any of the following occur: Severe pain not relieved by pain medication. Elevated temperature. Dressing soaked with blood. Inability to move fingers. White or bluish color to fingers.     Post Anesthesia Home Care Instructions  Activity: Get plenty of rest for the remainder of the day. A responsible individual must stay with you for 24 hours following the procedure.  For the next 24 hours, DO NOT: -Drive a car -Advertising copywriter -Drink alcoholic beverages -Take any medication unless instructed by your physician -Make any legal decisions or sign important papers.  Meals: Start with liquid foods such as gelatin or soup. Progress to regular foods as tolerated. Avoid greasy, spicy, heavy foods. If nausea and/or vomiting occur, drink only clear liquids until the nausea and/or vomiting subsides. Call your physician if vomiting continues.  Special Instructions/Symptoms: Your throat may feel dry or sore from the anesthesia or the breathing tube placed in your throat during surgery. If this causes discomfort, gargle with warm salt water. The discomfort should disappear  within 24 hours.  If you had a scopolamine patch placed behind your ear for the management of post- operative nausea and/or vomiting:  1. The medication in the patch is effective for 72 hours, after which it should be removed.  Wrap patch in a tissue and discard in the trash. Wash hands thoroughly with soap and water. 2. You may remove the patch earlier than 72 hours if you experience unpleasant side effects which may include dry mouth, dizziness or visual disturbances. 3. Avoid touching the patch. Wash your hands with soap and water after contact with the patch.    May have Tylenol today after 7:30 PM

## 2023-01-06 NOTE — H&P (Signed)
Stacey Brooks is an 62 y.o. female.   Chief Complaint: carpal tunnel syndrome, cubital tunnel syndrome HPI: 62 y.o. yo female with numbness and tingling left hand.  Positive nerve conduction studies. She wishes to have left carpal tunnel release and ulnar nerve decompression at elbow with possible transposition.   Allergies:  Allergies  Allergen Reactions   Clindamycin/Lincomycin Rash   Erythromycin Itching and Rash    Past Medical History:  Diagnosis Date   ANA positive    Anxiety    Asthma    Intermittent; asthmatic bronchitis   Cancer    skin cancer L arm   Carpal tunnel syndrome on right 10/11/2008   Cervical nerve root impingement 10/03/2012   Cervical spondylolysis 10/03/2012   Chronic back pain    Colon polyps 08/25/2012   Constipation    COPD (chronic obstructive pulmonary disease) 06/25/2011   De Quervain's disease (tenosynovitis) 07/02/2013   Depression 06/25/2011   Diabetes mellitus without complication    Diverticulosis 08/25/2012   Fatty liver 06/10/2015   Fibromyalgia 06/30/2009   GERD (gastroesophageal reflux disease) 07/03/2003   Hemorrhoids 07/01/2014   Hirsutism    Hypercholesterolemia 07/05/2003   Mild   Hyperlipidemia    Hypertension    IBS (irritable bowel syndrome) 2010   Insomnia 2010   Lumbar degenerative disc disease    Migraine 08/17/2019   Neural foraminal stenosis of cervical spine    Obesity 08/23/2013   Obstructive chronic bronchitis    Osteoarthritis 06/25/2011   Pilar cyst 07/30/2011   Polymyalgia 03/31/2009   SS-A antibody positive 08/22/2012   Suicide attempt 06/25/2011   Thyroid disease    Hypothyroidism   Tobacco abuse 07/03/2003   Trigger middle finger 05/30/2008    Past Surgical History:  Procedure Laterality Date   ABDOMINAL HYSTERECTOMY  04/04/2013   BACK SURGERY  2007   CARPECTOMY WITH RADIAL STYLOIDECTOMY  01/05/2012   DeQuervain's Release    CERVICAL FUSION  2007   COLONOSCOPY WITH PROPOFOL N/A 09/12/2019    Procedure: COLONOSCOPY WITH PROPOFOL;  Surgeon: Corbin Ade, MD;  Location: [facility];  Service: Endoscopy;  Laterality: N/A;  7:30am   COLONOSCOPY WITH PROPOFOL N/A 03/04/2020   Dr. Jena Gauss: Diverticulosis.  Multiple polyps removed.  Several tubular adenomas.  Next colonoscopy in 3 years will require modified bowel prep due to history of inadequate bowel prep before.Marland Kitchen   POLYPECTOMY  09/12/2019   Procedure: POLYPECTOMY;  Surgeon: Corbin Ade, MD;  Location: [facility];  Service: Endoscopy;;  hepatic flexure x5; cecal; descending   POLYPECTOMY  03/04/2020   Procedure: POLYPECTOMY;  Surgeon: Corbin Ade, MD;  Location: [facility];  Service: Endoscopy;;   WRIST ARTHROSCOPY  01/05/2012   release of carpal ligament    Family History: Family History  Problem Relation Age of Onset   Emphysema Mother    Hypertension Father    Stroke Father    Cancer Maternal Grandmother    Cancer Maternal Grandfather    Ovarian cancer Sister    Colon cancer Neg Hx     Social History:   reports that she has been smoking cigarettes. She has a 17.50 pack-year smoking history. She has never used smokeless tobacco. She reports that she does not currently use drugs. She reports that she does not drink alcohol.  Medications: Medications Prior to Admission  Medication Sig Dispense Refill   amLODipine (NORVASC) 5 MG tablet Take 5 mg by mouth daily.      Fluticasone-Umeclidin-Vilant (TRELEGY ELLIPTA)  200-62.5-25 MCG/ACT AEPB Inhale into the lungs.     furosemide (LASIX) 20 MG tablet Take 1 tablet (20 mg total) by mouth daily. Take 1 tablet for 3 days. 25 tablet 0   hydrALAZINE (APRESOLINE) 25 MG tablet Take 25 mg by mouth 2 (two) times daily.     hydrochlorothiazide (HYDRODIURIL) 25 MG tablet Take 25 mg by mouth daily.     lisinopril (ZESTRIL) 40 MG tablet Take 40 mg by mouth daily.      omeprazole (PRILOSEC) 20 MG capsule Take 1 capsule (20 mg total) by mouth 2 (two) times daily before a meal. 60  capsule 3   potassium chloride SA (KLOR-CON M) 20 MEQ tablet Take 20 mEq by mouth daily as needed (when taking Lasix).     rosuvastatin (CRESTOR) 20 MG tablet Take 20 mg by mouth daily.     Semaglutide (OZEMPIC, 0.25 OR 0.5 MG/DOSE, [place]) Inject into the skin.     albuterol (PROVENTIL HFA;VENTOLIN HFA) 108 (90 Base) MCG/ACT inhaler Inhale 2 puffs into the lungs every 6 (six) hours as needed for wheezing or shortness of breath.     escitalopram (LEXAPRO) 5 MG tablet Take 5 mg by mouth daily.      No results found for this or any previous visit (from the past 48 hour(s)).  No results found.    Height  (1.6 m), weight 76.2 kg.  General appearance: alert, cooperative, and appears stated age Head: Normocephalic, without obvious abnormality, atraumatic Neck: supple, symmetrical, trachea midline Extremities: Intact sensation and capillary refill all digits though fingertips feel different.  +epl/fpl/io.  No wounds.  Pulses: 2+ and symmetric Skin: Skin color, texture, turgor normal. No rashes or lesions Neurologic: Grossly normal Incision/Wound: none  Assessment/Plan Left carpal tunnel syndrome and ulnar nerve compression at elbow.  Non operative and operative treatment options have been discussed with the patient and patient wishes to proceed with operative treatment. Risks, benefits and alternatives of surgery were discussed including risks of blood loss, infection, damage to nerves/vessels/tendons/ligament/bone, failure of surgery, need for additional surgery, complication with wound healing, stiffness, pain.  She voiced understanding of these risks and elected to proceed.    [name] 01/06/2023, 9:57 AM

## 2023-01-06 NOTE — Op Note (Signed)
I assisted Surgeon(s) and Role:    * [name], MD - Primary    * Cindee Salt, MD - Assisting on the Procedure(s): LEFT CARPAL TUNNEL RELEASE DECOMPRESSION ULNAR NERVE AT CUBITAL TUNNEL LEFT ELBOW on 01/06/2023.  I provided assistance on this case as follows: Set up, approach, identification decompression of the median nerve at the wrist, followed by closure of the wound. Approach and identification of and retraction for release of the ulnar nerve at the elbow, checking for subluxation, closure of the wound and application of the dressing.  Electronically signed by: Cindee Salt, MD Date: 01/06/2023 Time: 1:08 PM

## 2023-01-06 NOTE — Anesthesia Postprocedure Evaluation (Signed)
Anesthesia Post Note  Patient: Stacey Brooks  Procedure(s) Performed: LEFT CARPAL TUNNEL RELEASE (Left: Wrist) DECOMPRESSION ULNAR NERVE AT CUBITAL TUNNEL LEFT ELBOW (Left: Arm Lower)     Patient location during evaluation: PACU Anesthesia Type: General Level of consciousness: awake and alert Pain management: pain level controlled Vital Signs Assessment: post-procedure vital signs reviewed and stable Respiratory status: spontaneous breathing, nonlabored ventilation, respiratory function stable and patient connected to nasal cannula oxygen Cardiovascular status: blood pressure returned to baseline and stable Postop Assessment: no apparent nausea or vomiting Anesthetic complications: no  No notable events documented.  Last Vitals:  Vitals:   01/06/23 1343 01/06/23 1350  BP: 137/76 (!) 117/90  Pulse: 71 88  Resp: 20 18  Temp:  36.8 C  SpO2: 98% 97%    Last Pain:  Vitals:   01/06/23 1350  TempSrc:   PainSc: 0-No pain                 [name]

## 2023-01-06 NOTE — Transfer of Care (Signed)
Immediate Anesthesia Transfer of Care Note  Patient: Stacey Brooks  Procedure(s) Performed: LEFT CARPAL TUNNEL RELEASE (Left: Wrist) DECOMPRESSION ULNAR NERVE AT CUBITAL TUNNEL LEFT ELBOW (Left: Arm Lower)  Patient Location: PACU  Anesthesia Type:General  Level of Consciousness: awake, alert , and oriented  Airway & Oxygen Therapy: Patient Spontanous Breathing and Patient connected to face mask oxygen  Post-op Assessment: Report given to RN and Post -op Vital signs reviewed and stable  Post vital signs: Reviewed and stable  Last Vitals:  Vitals Value Taken Time  BP    Temp    Pulse    Resp    SpO2      Last Pain:  Vitals:   01/06/23 1003  TempSrc: Oral  PainSc: 0-No pain         Complications: No notable events documented.

## 2023-01-06 NOTE — Anesthesia Procedure Notes (Signed)
Procedure Name: LMA Insertion Date/Time: 01/06/2023 12:16 PM  Performed by: Ronnette Hila, CRNAPre-anesthesia Checklist: Patient identified, Emergency Drugs available, Suction available and Patient being monitored Patient Re-evaluated:Patient Re-evaluated prior to induction Oxygen Delivery Method: Circle system utilized Preoxygenation: Pre-oxygenation with 100% oxygen Induction Type: IV induction Ventilation: Mask ventilation without difficulty LMA: LMA inserted LMA Size: 4.0 Number of attempts: 1 Airway Equipment and Method: Bite block Placement Confirmation: positive ETCO2 Tube secured with: Tape Dental Injury: Teeth and Oropharynx as per pre-operative assessment

## 2023-01-07 ENCOUNTER — Encounter (HOSPITAL_BASED_OUTPATIENT_CLINIC_OR_DEPARTMENT_OTHER): Payer: Self-pay | Admitting: Orthopedic Surgery

## 2023-01-13 DIAGNOSIS — G5603 Carpal tunnel syndrome, bilateral upper limbs: Secondary | ICD-10-CM | POA: Diagnosis not present

## 2023-01-13 DIAGNOSIS — G5622 Lesion of ulnar nerve, left upper limb: Secondary | ICD-10-CM | POA: Diagnosis not present

## 2023-01-21 DIAGNOSIS — G5622 Lesion of ulnar nerve, left upper limb: Secondary | ICD-10-CM | POA: Diagnosis not present

## 2023-01-21 DIAGNOSIS — M79642 Pain in left hand: Secondary | ICD-10-CM | POA: Diagnosis not present

## 2023-01-31 DIAGNOSIS — Z0000 Encounter for general adult medical examination without abnormal findings: Secondary | ICD-10-CM | POA: Diagnosis not present

## 2023-02-01 ENCOUNTER — Encounter: Payer: Self-pay | Admitting: *Deleted

## 2023-03-22 DIAGNOSIS — I10 Essential (primary) hypertension: Secondary | ICD-10-CM | POA: Diagnosis not present

## 2023-03-22 DIAGNOSIS — L219 Seborrheic dermatitis, unspecified: Secondary | ICD-10-CM | POA: Diagnosis not present

## 2023-03-22 DIAGNOSIS — E782 Mixed hyperlipidemia: Secondary | ICD-10-CM | POA: Diagnosis not present

## 2023-03-22 DIAGNOSIS — Z79899 Other long term (current) drug therapy: Secondary | ICD-10-CM | POA: Diagnosis not present

## 2023-03-22 DIAGNOSIS — L298 Other pruritus: Secondary | ICD-10-CM | POA: Diagnosis not present

## 2023-03-22 DIAGNOSIS — E119 Type 2 diabetes mellitus without complications: Secondary | ICD-10-CM | POA: Diagnosis not present

## 2023-03-22 DIAGNOSIS — L210 Seborrhea capitis: Secondary | ICD-10-CM | POA: Diagnosis not present

## 2023-03-22 DIAGNOSIS — R600 Localized edema: Secondary | ICD-10-CM | POA: Diagnosis not present

## 2023-03-22 DIAGNOSIS — F17210 Nicotine dependence, cigarettes, uncomplicated: Secondary | ICD-10-CM | POA: Diagnosis not present

## 2023-09-27 DIAGNOSIS — R059 Cough, unspecified: Secondary | ICD-10-CM | POA: Diagnosis not present

## 2023-09-27 DIAGNOSIS — J209 Acute bronchitis, unspecified: Secondary | ICD-10-CM | POA: Diagnosis not present

## 2023-11-01 DIAGNOSIS — I10 Essential (primary) hypertension: Secondary | ICD-10-CM | POA: Diagnosis not present

## 2023-11-01 DIAGNOSIS — F17210 Nicotine dependence, cigarettes, uncomplicated: Secondary | ICD-10-CM | POA: Diagnosis not present

## 2023-11-01 DIAGNOSIS — E785 Hyperlipidemia, unspecified: Secondary | ICD-10-CM | POA: Diagnosis not present

## 2023-11-01 DIAGNOSIS — R0602 Shortness of breath: Secondary | ICD-10-CM | POA: Diagnosis not present

## 2023-11-01 DIAGNOSIS — Z20822 Contact with and (suspected) exposure to covid-19: Secondary | ICD-10-CM | POA: Diagnosis not present

## 2023-11-01 DIAGNOSIS — Z720 Tobacco use: Secondary | ICD-10-CM | POA: Diagnosis not present

## 2023-11-01 DIAGNOSIS — J449 Chronic obstructive pulmonary disease, unspecified: Secondary | ICD-10-CM | POA: Diagnosis not present

## 2023-11-03 DIAGNOSIS — R1084 Generalized abdominal pain: Secondary | ICD-10-CM | POA: Diagnosis not present

## 2023-11-03 DIAGNOSIS — N3000 Acute cystitis without hematuria: Secondary | ICD-10-CM | POA: Diagnosis not present

## 2023-11-03 DIAGNOSIS — Z743 Need for continuous supervision: Secondary | ICD-10-CM | POA: Diagnosis not present

## 2023-11-03 DIAGNOSIS — K5900 Constipation, unspecified: Secondary | ICD-10-CM | POA: Diagnosis not present

## 2023-11-03 DIAGNOSIS — Z881 Allergy status to other antibiotic agents status: Secondary | ICD-10-CM | POA: Diagnosis not present

## 2023-11-03 DIAGNOSIS — F17210 Nicotine dependence, cigarettes, uncomplicated: Secondary | ICD-10-CM | POA: Diagnosis not present

## 2023-11-03 DIAGNOSIS — Z79899 Other long term (current) drug therapy: Secondary | ICD-10-CM | POA: Diagnosis not present

## 2023-11-03 DIAGNOSIS — E785 Hyperlipidemia, unspecified: Secondary | ICD-10-CM | POA: Diagnosis not present

## 2023-11-03 DIAGNOSIS — J449 Chronic obstructive pulmonary disease, unspecified: Secondary | ICD-10-CM | POA: Diagnosis not present

## 2023-11-03 DIAGNOSIS — R300 Dysuria: Secondary | ICD-10-CM | POA: Diagnosis not present

## 2023-11-03 DIAGNOSIS — I10 Essential (primary) hypertension: Secondary | ICD-10-CM | POA: Diagnosis not present

## 2023-11-03 DIAGNOSIS — R109 Unspecified abdominal pain: Secondary | ICD-10-CM | POA: Diagnosis not present

## 2023-12-15 ENCOUNTER — Ambulatory Visit: Payer: Self-pay | Admitting: Internal Medicine

## 2023-12-15 ENCOUNTER — Encounter: Payer: Self-pay | Admitting: Internal Medicine

## 2023-12-15 VITALS — BP 130/75 | HR 87 | Ht 63.0 in | Wt 155.8 lb

## 2023-12-15 DIAGNOSIS — Z1231 Encounter for screening mammogram for malignant neoplasm of breast: Secondary | ICD-10-CM

## 2023-12-15 DIAGNOSIS — Z1159 Encounter for screening for other viral diseases: Secondary | ICD-10-CM

## 2023-12-15 DIAGNOSIS — Z720 Tobacco use: Secondary | ICD-10-CM

## 2023-12-15 DIAGNOSIS — E785 Hyperlipidemia, unspecified: Secondary | ICD-10-CM

## 2023-12-15 DIAGNOSIS — E559 Vitamin D deficiency, unspecified: Secondary | ICD-10-CM | POA: Diagnosis not present

## 2023-12-15 DIAGNOSIS — Z1329 Encounter for screening for other suspected endocrine disorder: Secondary | ICD-10-CM

## 2023-12-15 DIAGNOSIS — F419 Anxiety disorder, unspecified: Secondary | ICD-10-CM

## 2023-12-15 DIAGNOSIS — R600 Localized edema: Secondary | ICD-10-CM | POA: Diagnosis not present

## 2023-12-15 DIAGNOSIS — M797 Fibromyalgia: Secondary | ICD-10-CM

## 2023-12-15 DIAGNOSIS — J449 Chronic obstructive pulmonary disease, unspecified: Secondary | ICD-10-CM

## 2023-12-15 DIAGNOSIS — E1169 Type 2 diabetes mellitus with other specified complication: Secondary | ICD-10-CM

## 2023-12-15 DIAGNOSIS — E1159 Type 2 diabetes mellitus with other circulatory complications: Secondary | ICD-10-CM | POA: Diagnosis not present

## 2023-12-15 DIAGNOSIS — K219 Gastro-esophageal reflux disease without esophagitis: Secondary | ICD-10-CM

## 2023-12-15 DIAGNOSIS — Z1321 Encounter for screening for nutritional disorder: Secondary | ICD-10-CM | POA: Diagnosis not present

## 2023-12-15 DIAGNOSIS — I10 Essential (primary) hypertension: Secondary | ICD-10-CM | POA: Diagnosis not present

## 2023-12-15 DIAGNOSIS — R634 Abnormal weight loss: Secondary | ICD-10-CM | POA: Diagnosis not present

## 2023-12-15 DIAGNOSIS — Z114 Encounter for screening for human immunodeficiency virus [HIV]: Secondary | ICD-10-CM

## 2023-12-15 DIAGNOSIS — E119 Type 2 diabetes mellitus without complications: Secondary | ICD-10-CM | POA: Diagnosis not present

## 2023-12-15 DIAGNOSIS — F5104 Psychophysiologic insomnia: Secondary | ICD-10-CM

## 2023-12-15 NOTE — Progress Notes (Signed)
 New Patient Office Visit  Subjective    Patient ID: Stacey Brooks, female    DOB: 25-Jan-1961  Age: 63 y.o. MRN: [id number]  CC:  Chief Complaint  Patient presents with   Establish Care    HPI Stacey Brooks presents to establish care.  She is a 63 year old woman with a previously documented past medical history significant for HTN, COPD, fibromyalgia, insomnia, GERD, HLD, and T2DM.  Previously followed by Stacey Hall, MD.  [name] reports feeling fairly well today.  She endorses chronic neck and shoulder pain.  Her acute concern is unintentional weight loss.  She has lost 45 pounds over the last year and has also noted growing skin nodules.  She endorses current tobacco use, smoking 0.5 packs/day of cigarettes since age 36.  She is precontemplative with regards to cessation.  Denies alcohol and illicit drug use.  Family medical history is significant for type 1 diabetes mellitus and cervical cancer.  Acute concerns, chronic medical conditions, and outstanding preventative care items discussed today are individually addressed in A/P below.  Outpatient Encounter Medications as of 12/15/2023  Medication Sig   albuterol (PROVENTIL HFA;VENTOLIN HFA) 108 (90 Base) MCG/ACT inhaler Inhale 2 puffs into the lungs every 6 (six) hours as needed for wheezing or shortness of breath.   ALPRAZolam (XANAX) 0.25 MG tablet Take 0.25 mg by mouth daily.   Fluticasone-Umeclidin-Vilant (TRELEGY ELLIPTA) 200-62.5-25 MCG/ACT AEPB Inhale into the lungs.   hydrochlorothiazide (HYDRODIURIL) 25 MG tablet Take 25 mg by mouth daily.   lisinopril (ZESTRIL) 40 MG tablet Take 40 mg by mouth daily.    omeprazole  (PRILOSEC) 20 MG capsule Take 1 capsule (20 mg total) by mouth 2 (two) times daily before a meal.   rosuvastatin (CRESTOR) 20 MG tablet Take 20 mg by mouth daily.   [DISCONTINUED] escitalopram (LEXAPRO) 5 MG tablet Take 5 mg by mouth daily.   furosemide  (LASIX ) 20 MG tablet Take 1 tablet (20 mg total) by mouth  daily. Take 1 tablet for 3 days.   potassium chloride  SA (KLOR-CON  M) 20 MEQ tablet Take 20 mEq by mouth daily as needed (when taking Lasix ). (Patient not taking: Reported on 12/15/2023)   [DISCONTINUED] amLODipine (NORVASC) 5 MG tablet Take 5 mg by mouth daily.  (Patient not taking: Reported on 12/15/2023)   [DISCONTINUED] hydrALAZINE (APRESOLINE) 25 MG tablet Take 25 mg by mouth 2 (two) times daily. (Patient not taking: Reported on 12/15/2023)   [DISCONTINUED] HYDROcodone -acetaminophen  (NORCO) 5-325 MG tablet 1-2 tabs po q6 hours prn pain (Patient not taking: Reported on 12/15/2023)   [DISCONTINUED] Semaglutide (OZEMPIC, 0.25 OR 0.5 MG/DOSE, [place]) Inject into the skin. (Patient not taking: Reported on 12/15/2023)   No facility-administered encounter medications on file as of 12/15/2023.    Past Medical History:  Diagnosis Date   ANA positive    Anxiety    Asthma    Intermittent; asthmatic bronchitis   Cancer (HCC)    skin cancer L arm   Carpal tunnel syndrome on right 10/11/2008   Cervical nerve root impingement 10/03/2012   Cervical spondylolysis 10/03/2012   Chronic back pain    Colon polyps 08/25/2012   Constipation    COPD (chronic obstructive pulmonary disease) (HCC) 06/25/2011   De Quervain's disease (tenosynovitis) 07/02/2013   Depression 06/25/2011   Diabetes mellitus without complication (HCC)    Diverticulosis 08/25/2012   Fatty liver 06/10/2015   Fibromyalgia 06/30/2009   GERD (gastroesophageal reflux disease) 07/03/2003   Heme + stool 04/11/2019   Hemorrhoids  07/01/2014   Hirsutism    Hypercholesterolemia 07/05/2003   Mild   Hyperlipidemia    Hypertension    IBS (irritable bowel syndrome) 2010   Insomnia 2010   Lumbar degenerative disc disease    Migraine 08/17/2019   Neural foraminal stenosis of cervical spine    Obesity 08/23/2013   Obstructive chronic bronchitis (HCC)    Osteoarthritis 06/25/2011   Pilar cyst 07/30/2011   Polymyalgia (HCC) 03/31/2009   SS-A  antibody positive 08/22/2012   Suicide attempt (HCC) 06/25/2011   Thyroid  disease    Hypothyroidism   Tobacco abuse 07/03/2003   Trigger middle finger 05/30/2008    Past Surgical History:  Procedure Laterality Date   ABDOMINAL HYSTERECTOMY  04/04/2013   ANTERIOR INTEROSSEOUS NERVE DECOMPRESSION Left 01/06/2023   Procedure: DECOMPRESSION ULNAR NERVE AT CUBITAL TUNNEL LEFT ELBOW;  Surgeon: Stacey Capra, MD;  Location: [facility];  Service: Orthopedics;  Laterality: Left;   BACK SURGERY  2007   CARPAL TUNNEL RELEASE Left 01/06/2023   Procedure: LEFT CARPAL TUNNEL RELEASE;  Surgeon: Stacey Capra, MD;  Location: [facility];  Service: Orthopedics;  Laterality: Left;   CARPECTOMY WITH RADIAL STYLOIDECTOMY  01/05/2012   DeQuervain's Release    CERVICAL FUSION  2007   COLONOSCOPY WITH PROPOFOL  N/A 09/12/2019   Procedure: COLONOSCOPY WITH PROPOFOL ;  Surgeon: Stacey Espy, MD;  [facility];  Service: Endoscopy;  Laterality: N/A;  7:30am   COLONOSCOPY WITH PROPOFOL  N/A 03/04/2020   Dr. Riley Brooks: Diverticulosis.  Multiple polyps removed.  Several tubular adenomas.  Next colonoscopy in 3 years will require modified bowel prep due to history of inadequate bowel prep before.Stacey Brooks   POLYPECTOMY  09/12/2019   Procedure: POLYPECTOMY;  Surgeon: Stacey Espy, MD;  Location: [facility];  Service: Endoscopy;;  hepatic flexure x5; cecal; descending   POLYPECTOMY  03/04/2020   Procedure: POLYPECTOMY;  Surgeon: Stacey Espy, MD;  Location: [facility];  Service: Endoscopy;;   WRIST ARTHROSCOPY  01/05/2012   release of carpal ligament    Family History  Problem Relation Age of Onset   Emphysema Mother    Hypertension Father    Stroke Father    Cancer Maternal Grandmother    Cancer Maternal Grandfather    Ovarian cancer Sister    Colon cancer Neg Hx     Social History   Socioeconomic History   Marital status: Widowed    Spouse name: Not on file   Number of  children: 3   Years of education: Not on file   Highest education level: Not on file  Occupational History   Not on file  Tobacco Use   Smoking status: Every Day    Current packs/day: 0.50    Average packs/day: 0.5 packs/day for 35.0 years (17.5 ttl pk-yrs)    Types: Cigarettes   Smokeless tobacco: Never   Tobacco comments:    has used cocaine in the past, last 1 yr ago  Vaping Use   Vaping status: Never Used  Substance and Sexual Activity   Alcohol use: No    Alcohol/week: 0.0 standard drinks of alcohol   Drug use: Not Currently    Comment: has used cocaine last used 2023   Sexual activity: Not Currently    Birth control/protection: Surgical    Comment: hyst  Other Topics Concern   Not on file  Social History Narrative   Widowed.  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       Social Drivers  of Health   Financial Resource Strain: Medium Risk (03/17/2020)   Overall Financial Resource Strain (CARDIA)    Difficulty of Paying Living Expenses: Somewhat hard  Food Insecurity: No Food Insecurity (02/19/2021)   Received from [facility], [facility]   Hunger Vital Sign    Worried About Running Out of Food in the Last Year: Never true    Ran Out of Food in the Last Year: Never true  Transportation Needs: No Transportation Needs (11/03/2020)   [facility] from [facility], [facility]   [facility] - Transportation    Lack of Transportation (Medical): No    Lack of Transportation (Non-Medical): No  Physical Activity: Insufficiently Active (03/17/2020)   Exercise Vital Sign    Days of Exercise per Week: 2 days    Minutes of Exercise per Session: 20 min  Stress: Stress Concern Present (03/17/2020)   [employer] [facility] Occup[employer] - Occupational Stress Questionnaire    Feeling of Stress : Very much  Social Connections: Unknown (02/02/2022)   Received from [facility], [facility]   Social Network    Social Network: Not on file  Intimate Partner Violence: Not At Risk (03/18/2022)    Received from [facility], [facility]   Humiliation, Afraid, Rape, and Kick questionnaire    Fear of Current or Ex-Partner: No    Emotionally Abused: No    Physically Abused: No    Sexually Abused: No   Review of Systems  Constitutional:  Positive for weight loss (Unintentional 45 pound weight loss in last year). Negative for chills and fever.  HENT:  Negative for sore throat.   Respiratory:  Negative for cough and shortness of breath.   Cardiovascular:  Negative for chest pain, palpitations and leg swelling.  Gastrointestinal:  Negative for abdominal pain, blood in stool, constipation, diarrhea, nausea and vomiting.  Genitourinary:  Negative for dysuria and hematuria.  Musculoskeletal:  Positive for joint pain (Bilateral shoulder) and neck pain. Negative for myalgias.  Skin:  Negative for itching and rash.  Neurological:  Negative for dizziness and headaches.  Psychiatric/Behavioral:  Negative for depression and suicidal ideas.    Objective    BP 130/75 (BP Location: Left Arm, Patient Position: Sitting, Cuff Size: Normal)   Pulse 87   Ht 5\' 3"  (1.6 m)   Wt 155 lb 12.8 oz (70.7 kg)   SpO2 98%   BMI 27.60 kg/m   Physical Exam Vitals reviewed.  Constitutional:      General: She is not in acute distress.    Appearance: Normal appearance. She is not toxic-appearing.  HENT:     Head: Normocephalic and atraumatic.     Right Ear: External ear normal.     Left Ear: External ear normal.     Nose: Nose normal. No congestion or rhinorrhea.     Mouth/Throat:     Mouth: Mucous membranes are moist.     Pharynx: Oropharynx is clear. No oropharyngeal exudate or posterior oropharyngeal erythema.  Eyes:     General: No scleral icterus.    Extraocular Movements: Extraocular movements intact.     Conjunctiva/sclera: Conjunctivae normal.     Pupils: Pupils are equal, round, and reactive to light.  Cardiovascular:     Rate and Rhythm: Normal rate and regular rhythm.     Pulses:  Normal pulses.     Heart sounds: Normal heart sounds. No murmur heard.    No friction rub. No gallop.  Pulmonary:     Effort:  Pulmonary effort is normal.     Breath sounds: Normal breath sounds. No wheezing, rhonchi or rales.  Abdominal:     General: Abdomen is flat. Bowel sounds are normal. There is no distension.     Palpations: Abdomen is soft.     Tenderness: There is no abdominal tenderness.  Musculoskeletal:        General: No swelling. Normal range of motion.     Cervical back: Normal range of motion.     Right lower leg: No edema.     Left lower leg: No edema.  Lymphadenopathy:     Cervical: No cervical adenopathy.  Skin:    General: Skin is warm and dry.     Capillary Refill: Capillary refill takes less than 2 seconds.     Coloration: Skin is not jaundiced.  Neurological:     General: No focal deficit present.     Mental Status: She is alert and oriented to person, place, and time.  Psychiatric:        Mood and Affect: Mood normal.        Behavior: Behavior normal.    Assessment & Plan:   Problem List Items Addressed This Visit       Essential hypertension - Primary   She is currently prescribed lisinopril 40 mg daily and HCTZ 25 mg daily.  BP is adequately controlled on this regimen.  No medication changes are indicated today.      COPD (chronic obstructive pulmonary disease) (HCC)   She is currently prescribed Trelegy for maintenance and albuterol for rescue use.  Asymptomatic currently.  Pulmonary exam is unremarkable.      GERD (gastroesophageal reflux disease)   Symptoms are adequately controlled with omeprazole  20 mg twice daily      Type 2 diabetes mellitus without complications (HCC)   Diet controlled.  A1c 7.0 on labs from 2024.  Repeat A1c and urine microalbumin/creatinine ratio ordered today.  Currently prescribed ACEi and statin therapy.      Hyperlipidemia associated with type 2 diabetes mellitus (HCC)   She is prescribed rosuvastatin 20 mg  daily.  Repeat lipid panel ordered today.      Bilateral lower extremity edema   Chronic, intermittent issue.  She is prescribed Lasix  20 mg daily as needed.  Minimal edema is present on exam today.      Insomnia   She is prescribed alprazolam 0.25 mg nightly as needed for insomnia.  This has been managed by her PCP previously.  PDMP reviewed and is appropriate.  UDS and controlled substance agreement are pending.      Unintentional weight loss   Her acute concern today is unintentional weight loss.  She describes a 45 pound weight loss over the last year.  Of note, her documented weight in April 2024 was 167 pounds.  Her current weight is 155 pounds.  No additional B symptoms identified.  It should also be noted that she was prescribed Ozempic for 6 months within the last year. -Will update basic labs today and plan for follow-up in 6 weeks for weight check and lab review.      Breast cancer screening by mammogram   Screening mammogram ordered today      Tobacco use   She endorses current tobacco use, smoking at least 0.5 packs/day of cigarettes since age 47, usually smoking closer to 2 packs/day over this time.  She is precontemplative with regards to cessation but is open to lung cancer screening.  Lung cancer  screening referral placed today.      Return in about 6 weeks (around 01/26/2024) for weight check, lab reivew.   Tobi Fortes, MD

## 2023-12-15 NOTE — Patient Instructions (Signed)
 It was a pleasure to see you today.  Thank you for giving [place] the opportunity to be involved in your care.  Below is a brief recap of your visit and next steps.  We will plan to see you again in 6 weeks.  Summary You have established care today No medication changes were made Basic labs ordered Controlled substance agreement and UDS pending Follow up in 6 weeks for lab review and weight check

## 2023-12-16 ENCOUNTER — Encounter: Payer: Self-pay | Admitting: Internal Medicine

## 2023-12-20 LAB — CBC WITH DIFFERENTIAL/PLATELET
Basophils Absolute: 0 10*3/uL (ref 0.0–0.2)
Basos: 1 %
EOS (ABSOLUTE): 0.1 10*3/uL (ref 0.0–0.4)
Eos: 2 %
Hematocrit: 43.9 % (ref 34.0–46.6)
Hemoglobin: 14.1 g/dL (ref 11.1–15.9)
Immature Grans (Abs): 0 10*3/uL (ref 0.0–0.1)
Immature Granulocytes: 0 %
Lymphocytes Absolute: 1.4 10*3/uL (ref 0.7–3.1)
Lymphs: 23 %
MCH: 28 pg (ref 26.6–33.0)
MCHC: 32.1 g/dL (ref 31.5–35.7)
MCV: 87 fL (ref 79–97)
Monocytes Absolute: 0.4 10*3/uL (ref 0.1–0.9)
Monocytes: 7 %
Neutrophils Absolute: 4.1 10*3/uL (ref 1.4–7.0)
Neutrophils: 67 %
Platelets: 228 10*3/uL (ref 150–450)
RBC: 5.03 x10E6/uL (ref 3.77–5.28)
RDW: 14.4 % (ref 11.7–15.4)
WBC: 6.1 10*3/uL (ref 3.4–10.8)

## 2023-12-20 LAB — MICROALBUMIN / CREATININE URINE RATIO

## 2023-12-20 LAB — HIV ANTIBODY (ROUTINE TESTING W REFLEX): HIV Screen 4th Generation wRfx: NONREACTIVE

## 2023-12-20 LAB — VITAMIN D 25 HYDROXY (VIT D DEFICIENCY, FRACTURES): Vit D, 25-Hydroxy: 36.4 ng/mL (ref 30.0–100.0)

## 2023-12-20 LAB — CMP14+EGFR
ALT: 11 IU/L (ref 0–32)
AST: 17 IU/L (ref 0–40)
Albumin: 4.1 g/dL (ref 3.9–4.9)
Alkaline Phosphatase: 166 IU/L — ABNORMAL HIGH (ref 44–121)
BUN/Creatinine Ratio: 16 (ref 12–28)
BUN: 12 mg/dL (ref 8–27)
Bilirubin Total: 0.4 mg/dL (ref 0.0–1.2)
CO2: 23 mmol/L (ref 20–29)
Calcium: 9.4 mg/dL (ref 8.7–10.3)
Chloride: 106 mmol/L (ref 96–106)
Creatinine, Ser: 0.74 mg/dL (ref 0.57–1.00)
Globulin, Total: 2.2 g/dL (ref 1.5–4.5)
Glucose: 95 mg/dL (ref 70–99)
Potassium: 4.5 mmol/L (ref 3.5–5.2)
Sodium: 144 mmol/L (ref 134–144)
Total Protein: 6.3 g/dL (ref 6.0–8.5)
eGFR: 91 mL/min/{1.73_m2} (ref >59)

## 2023-12-20 LAB — B12 AND FOLATE PANEL
Folate: 10.3 ng/mL (ref >3.0)
Vitamin B-12: 204 pg/mL — ABNORMAL LOW (ref 232–1245)

## 2023-12-20 LAB — LIPID PANEL
Chol/HDL Ratio: 2.7 ratio (ref 0.0–4.4)
Cholesterol, Total: 151 mg/dL (ref 100–199)
HDL: 55 mg/dL (ref >39)
LDL Chol Calc (NIH): 74 mg/dL (ref 0–99)
Triglycerides: 124 mg/dL (ref 0–149)
VLDL Cholesterol Cal: 22 mg/dL (ref 5–40)

## 2023-12-20 LAB — HEMOGLOBIN A1C
Est. average glucose Bld gHb Est-mCnc: 108 mg/dL
Hgb A1c MFr Bld: 5.4 % (ref 4.8–5.6)

## 2023-12-20 LAB — TSH+FREE T4
Free T4: 1.28 ng/dL (ref 0.82–1.77)
TSH: 0.972 u[IU]/mL (ref 0.450–4.500)

## 2023-12-20 LAB — HCV INTERPRETATION

## 2023-12-20 LAB — HCV AB W REFLEX TO QUANT PCR: HCV Ab: NONREACTIVE

## 2023-12-29 ENCOUNTER — Other Ambulatory Visit: Payer: Self-pay | Admitting: *Deleted

## 2023-12-29 ENCOUNTER — Telehealth: Payer: Self-pay | Admitting: *Deleted

## 2023-12-29 DIAGNOSIS — Z87891 Personal history of nicotine dependence: Secondary | ICD-10-CM

## 2023-12-29 DIAGNOSIS — Z122 Encounter for screening for malignant neoplasm of respiratory organs: Secondary | ICD-10-CM

## 2023-12-29 DIAGNOSIS — F17210 Nicotine dependence, cigarettes, uncomplicated: Secondary | ICD-10-CM

## 2023-12-29 NOTE — Telephone Encounter (Signed)
 Lung Cancer Screening Narrative/Criteria Questionnaire (Cigarette Smokers Only- No Cigars/Pipes/vapes)   Stacey Brooks   SDMV:02/08/24 11:00- Stacey Brooks                                           1960-11-12              LDCT: 02/09/24 10:00- [facility]    62 y.o.   Phone: [phone]  Lung Screening Narrative (confirm age 62-77 yrs Medicare / 50-80 yrs Private pay insurance)   Insurance information:UHC   Referring Provider:[facility]   This screening involves an initial phone call with a team member from our program. It is called a shared decision making visit. The initial meeting is required by insurance and Medicare to make sure you understand the program. This appointment takes about 15-20 minutes to complete. The CT scan will completed at a separate date/time. This scan takes about 5-10 minutes to complete and you may eat and drink before and after the scan.  Criteria questions for Lung Cancer Screening:   Are you a current or former smoker? Current Age began smoking: 13   If you are a former smoker, what year did you quit smoking? (within 15 yrs)   To calculate your smoking history, I need an accurate estimate of how many packs of cigarettes you smoked per day and for how many years. (Not just the number of PPD you are now smoking)   Years smoking 49 x Packs per day 3/4 - 1 = Pack years 44   (at least 20 pack yrs)   (Make sure they understand that we need to know how much they have smoked in the past, not just the number of PPD they are smoking now)  Do you have a personal history of cancer?  Yes - (type and when diagnosed - 5 yrs cancer free) Skin    Do you have a family history of cancer? Yes  (cancer type and and relative) sister(ovarian) MGM (lung) MGF (lung)  Are you coughing up blood?  No  Have you had unexplained weight loss of 15 lbs or more in the last 6 months? Yes Had been on Ozempic  It looks like you meet all criteria.     Additional information: N/A

## 2024-01-10 ENCOUNTER — Ambulatory Visit: Payer: Self-pay

## 2024-01-10 DIAGNOSIS — B3731 Acute candidiasis of vulva and vagina: Secondary | ICD-10-CM

## 2024-01-10 MED ORDER — FLUCONAZOLE 150 MG PO TABS: 150.0000 mg | ORAL_TABLET | Freq: Once | ORAL | 0 refills | Status: DC

## 2024-01-10 MED ORDER — FLUCONAZOLE 150 MG PO TABS: 150.0000 mg | ORAL_TABLET | Freq: Once | ORAL | 0 refills | Status: AC

## 2024-01-10 NOTE — Addendum Note (Signed)
 Addended by: Cosme Jacob E on: 01/10/2024 02:51 PM   Modules accepted: Orders

## 2024-01-10 NOTE — Telephone Encounter (Signed)
 Copied from CRM [id number]. Topic: Clinical - Pink Word Triage >> Jan 10, 2024 10:14 AM Crispin Dolphin wrote: Reason for Triage: Possible yeast infection - had for 3 years but once medication is gone it comes right back. Itching really bad. One medication that worked was the purple stuff but she does not like how that messes up her clothes. Uses [facility] in [place] if something can be called in. Thank You  Chief Complaint: Yeast infection Symptoms: Itching, vaginal discharge Frequency: A few weeks Pertinent Negatives: Patient denies bleeding Disposition: [] ED /[] Urgent Care (no appt availability in office) / [x] Appointment(In office/virtual)/ []  [facility]/ [] Home Care/ [x] Refused Recommended Disposition /[] [facility] [place] [facility]/ []  Follow-up with PCP Additional Notes: Patient is calling in to report symptoms of a vaginal yeast infection. Patient stated symptoms have been ongoing for a few weeks. Patient reported moderate-severe itching and clear vaginal discharge. Patient stated her provider has never prescribed her medication for this. Advised patient to be seen within 3 days, per protocol. Patient declined because she is out of town. Patient would like a medication to be called in. Please advise.   Reason for Disposition  [1] Symptoms of a yeast infection (i.e., itchy, white discharge, not bad smelling) AND [2] not improved > 3 days following Care Advice  Answer Assessment - Initial Assessment Questions 1. DESCRIPTION: "Describe the itching you are having." "Where is it located?"     Right side of vaginal area 2. SEVERITY: "How bad is it?"    - MILD: Doesn't interfere with normal activities.   - MODERATE-SEVERE: Interferes with work, school, sleep, or other activities.      Moderate/severe- stated itching is interfering with everything 3. SCRATCHING: "Are there any scratch marks? Bleeding?"     Denies 4. ONSET: "When did the itching begin?"      Ongoing for weeks, recently  worsened 5. CAUSE: "What do you think is causing the itching?"      Yeast infection 6. OTHER SYMPTOMS: "Do you have any other symptoms?"      Clear vaginal discharge  Protocols used: Itching - Localized-A-AH, Vaginal Symptoms-A-AH

## 2024-01-24 ENCOUNTER — Encounter: Payer: Self-pay | Admitting: Internal Medicine

## 2024-01-24 DIAGNOSIS — Z720 Tobacco use: Secondary | ICD-10-CM | POA: Insufficient documentation

## 2024-01-24 DIAGNOSIS — Z1231 Encounter for screening mammogram for malignant neoplasm of breast: Secondary | ICD-10-CM | POA: Insufficient documentation

## 2024-01-24 DIAGNOSIS — R634 Abnormal weight loss: Secondary | ICD-10-CM | POA: Insufficient documentation

## 2024-01-24 DIAGNOSIS — R600 Localized edema: Secondary | ICD-10-CM | POA: Insufficient documentation

## 2024-01-24 DIAGNOSIS — J449 Chronic obstructive pulmonary disease, unspecified: Secondary | ICD-10-CM | POA: Insufficient documentation

## 2024-01-24 DIAGNOSIS — E1169 Type 2 diabetes mellitus with other specified complication: Secondary | ICD-10-CM | POA: Insufficient documentation

## 2024-01-24 DIAGNOSIS — E119 Type 2 diabetes mellitus without complications: Secondary | ICD-10-CM | POA: Insufficient documentation

## 2024-01-24 DIAGNOSIS — G4700 Insomnia, unspecified: Secondary | ICD-10-CM | POA: Insufficient documentation

## 2024-01-24 DIAGNOSIS — I10 Essential (primary) hypertension: Secondary | ICD-10-CM | POA: Insufficient documentation

## 2024-01-24 NOTE — Assessment & Plan Note (Signed)
 She endorses current tobacco use, smoking at least 0.5 packs/day of cigarettes since age 63, usually smoking closer to 2 packs/day over this time.  She is precontemplative with regards to cessation but is open to lung cancer screening.  Lung cancer screening referral placed today.

## 2024-01-24 NOTE — Assessment & Plan Note (Signed)
 Chronic, intermittent issue.  She is prescribed Lasix  20 mg daily as needed.  Minimal edema is present on exam today.

## 2024-01-24 NOTE — Assessment & Plan Note (Signed)
 Symptoms are adequately controlled with omeprazole  20 mg twice daily

## 2024-01-24 NOTE — Assessment & Plan Note (Signed)
 She is prescribed alprazolam 0.25 mg nightly as needed for insomnia.  This has been managed by her PCP previously.  PDMP reviewed and is appropriate.  UDS and controlled substance agreement are pending.

## 2024-01-24 NOTE — Assessment & Plan Note (Signed)
 Diet controlled.  A1c 7.0 on labs from 2024.  Repeat A1c and urine microalbumin/creatinine ratio ordered today.  Currently prescribed ACEi and statin therapy.

## 2024-01-24 NOTE — Assessment & Plan Note (Signed)
 She is prescribed rosuvastatin 20 mg daily.  Repeat lipid panel ordered today.

## 2024-01-24 NOTE — Assessment & Plan Note (Signed)
 Her acute concern today is unintentional weight loss.  She describes a 45 pound weight loss over the last year.  Of note, her documented weight in April 2024 was 167 pounds.  Her current weight is 155 pounds.  No additional B symptoms identified.  It should also be noted that she was prescribed Ozempic for 6 months within the last year. -Will update basic labs today and plan for follow-up in 6 weeks for weight check and lab review.

## 2024-01-24 NOTE — Assessment & Plan Note (Signed)
 Screening mammogram ordered today

## 2024-01-24 NOTE — Assessment & Plan Note (Signed)
 She is currently prescribed lisinopril 40 mg daily and HCTZ 25 mg daily.  BP is adequately controlled on this regimen.  No medication changes are indicated today.

## 2024-01-24 NOTE — Assessment & Plan Note (Signed)
 She is currently prescribed Trelegy for maintenance and albuterol for rescue use.  Asymptomatic currently.  Pulmonary exam is unremarkable.

## 2024-02-08 ENCOUNTER — Encounter: Payer: Self-pay | Admitting: Adult Health

## 2024-02-08 ENCOUNTER — Ambulatory Visit: Admitting: Adult Health

## 2024-02-08 DIAGNOSIS — F17210 Nicotine dependence, cigarettes, uncomplicated: Secondary | ICD-10-CM

## 2024-02-08 NOTE — Progress Notes (Signed)
  Virtual Visit via Telephone Note  I connected with Stacey Brooks , 02/08/24 11:10 AM by a telemedicine application and verified that I am speaking with the correct person using two identifiers.  Location: Patient: home Provider: home   I discussed the limitations of evaluation and management by telemedicine and the availability of in person appointments. The patient expressed understanding and agreed to proceed.   Shared Decision Making Visit Lung Cancer Screening Program [phone])   Eligibility: 63 y.o. Pack Years Smoking History Calculation = 45 pack years  (# packs/per year x # years smoked) Recent History of coughing up blood  no Unexplained weight loss? no ( >Than 15 pounds within the last 6 months ) Prior History Lung / other cancer no (Diagnosis within the last 5 years already requiring surveillance chest CT Scans). Smoking Status Current Smoker   Visit Components: Discussion included one or more decision making aids. YES Discussion included risk/benefits of screening. YES Discussion included potential follow up diagnostic testing for abnormal scans. YES Discussion included meaning and risk of over diagnosis. YES Discussion included meaning and risk of False Positives. YES Discussion included meaning of total radiation exposure. YES  Counseling Included: Importance of adherence to annual lung cancer LDCT screening. YES Impact of comorbidities on ability to participate in the program. YES Ability and willingness to under diagnostic treatment. YES  Smoking Cessation Counseling: Current Smokers:  Discussed importance of smoking cessation. yes Information about tobacco cessation classes and interventions provided to patient. yes Patient provided with "ticket" for LDCT Scan. yes Symptomatic Patient. NO Diagnosis Code: Tobacco Use Z72.0 Asymptomatic Patient yes  Counseling - 4 minutes of smoking cessation counseling (CT Chest Lung Cancer Screening Low Dose W/O CM)  [id number]  Z12.2-Screening of respiratory organs Z87.891-Personal history of nicotine dependence   [place] Dose 02/08/24

## 2024-02-08 NOTE — Patient Instructions (Signed)

## 2024-02-09 ENCOUNTER — Ambulatory Visit (HOSPITAL_COMMUNITY)
Admission: RE | Admit: 2024-02-09 | Discharge: 2024-02-09 | Disposition: A | Discharge status: Home / Self Care | Source: Ambulatory Visit | Attending: Acute Care | Admitting: Acute Care

## 2024-02-09 DIAGNOSIS — F17210 Nicotine dependence, cigarettes, uncomplicated: Secondary | ICD-10-CM | POA: Insufficient documentation

## 2024-02-09 DIAGNOSIS — Z122 Encounter for screening for malignant neoplasm of respiratory organs: Secondary | ICD-10-CM | POA: Diagnosis not present

## 2024-02-09 DIAGNOSIS — Z87891 Personal history of nicotine dependence: Secondary | ICD-10-CM | POA: Diagnosis not present

## 2024-02-14 ENCOUNTER — Ambulatory Visit (INDEPENDENT_AMBULATORY_CARE_PROVIDER_SITE_OTHER): Admitting: Internal Medicine

## 2024-02-14 ENCOUNTER — Encounter: Payer: Self-pay | Admitting: Internal Medicine

## 2024-02-14 VITALS — BP 145/85 | HR 80 | Ht 63.0 in | Wt 157.6 lb

## 2024-02-14 DIAGNOSIS — E538 Deficiency of other specified B group vitamins: Secondary | ICD-10-CM | POA: Insufficient documentation

## 2024-02-14 DIAGNOSIS — E1159 Type 2 diabetes mellitus with other circulatory complications: Secondary | ICD-10-CM

## 2024-02-14 DIAGNOSIS — E119 Type 2 diabetes mellitus without complications: Secondary | ICD-10-CM

## 2024-02-14 DIAGNOSIS — R634 Abnormal weight loss: Secondary | ICD-10-CM | POA: Diagnosis not present

## 2024-02-14 DIAGNOSIS — R21 Rash and other nonspecific skin eruption: Secondary | ICD-10-CM | POA: Diagnosis not present

## 2024-02-14 DIAGNOSIS — F5104 Psychophysiologic insomnia: Secondary | ICD-10-CM

## 2024-02-14 DIAGNOSIS — Z79899 Other long term (current) drug therapy: Secondary | ICD-10-CM | POA: Diagnosis not present

## 2024-02-14 NOTE — Assessment & Plan Note (Signed)
 Under excellent control with diet.  A1c 5.4 on labs from March.  Urine microalbumin/creatinine ratio pending.

## 2024-02-14 NOTE — Patient Instructions (Signed)
 It was a pleasure to see you today.  Thank you for giving us  the opportunity to be involved in your care.  Below is a brief recap of your visit and next steps.  We will plan to see you again in 6 months.   Summary I recommend using hydrocortisone cream for itch relief  Urine studies ordered Follow up in 6 months

## 2024-02-14 NOTE — Assessment & Plan Note (Signed)
 Noted on labs from March.  She has started daily vitamin B12 supplementation.  Repeat B12 level at next follow-up appointment.

## 2024-02-14 NOTE — Progress Notes (Signed)
 Established Patient Office Visit  Subjective   Patient ID: Stacey Brooks, female    DOB: 1960-12-22  Age: 63 y.o. MRN: [id number]  Chief Complaint  Patient presents with   Hypertension    Six week follow up    itching    Hand and back itching , painful    [name] returns to care today for follow-up.  She was last evaluated by me on 3/27 as a new patient presenting to establish care.  She endorsed a 45 pound unintentional weight loss over the last year at that time.  No medication changes were made, basic labs ordered, and 6-week follow-up recommended for weight check and lab review.  There have been no acute interval events.  Today she reports feeling well.  Her acute concern is an itching rash on the dorsum of her left hand that has been present for 2 weeks.  She tried switching detergents but symptoms did not improve.  She has tried applying a moisturizing lotion without symptom relief.  Past Medical History:  Diagnosis Date   ANA positive    Anxiety    Asthma    Intermittent; asthmatic bronchitis   Cancer (HCC)    skin cancer L arm   Carpal tunnel syndrome on right 10/11/2008   Cervical nerve root impingement 10/03/2012   Cervical spondylolysis 10/03/2012   Chronic back pain    Colon polyps 08/25/2012   Constipation    COPD (chronic obstructive pulmonary disease) (HCC) 06/25/2011   De Quervain's disease (tenosynovitis) 07/02/2013   Depression 06/25/2011   Diabetes mellitus without complication (HCC)    Diverticulosis 08/25/2012   Fatty liver 06/10/2015   Fibromyalgia 06/30/2009   GERD (gastroesophageal reflux disease) 07/03/2003   Heme + stool 04/11/2019   Hemorrhoids 07/01/2014   Hirsutism    Hypercholesterolemia 07/05/2003   Mild   Hyperlipidemia    Hypertension    IBS (irritable bowel syndrome) 2010   Insomnia 2010   Lumbar degenerative disc disease    Migraine 08/17/2019   Neural foraminal stenosis of cervical spine    Obesity 08/23/2013   Obstructive  chronic bronchitis (HCC)    Osteoarthritis 06/25/2011   Pilar cyst 07/30/2011   Polymyalgia (HCC) 03/31/2009   SS-A antibody positive 08/22/2012   Suicide attempt (HCC) 06/25/2011   Thyroid  disease    Hypothyroidism   Tobacco abuse 07/03/2003   Trigger middle finger 05/30/2008   Past Surgical History:  Procedure Laterality Date   ABDOMINAL HYSTERECTOMY  04/04/2013   ANTERIOR INTEROSSEOUS NERVE DECOMPRESSION Left 01/06/2023   Procedure: DECOMPRESSION ULNAR NERVE AT CUBITAL TUNNEL LEFT ELBOW;  Surgeon: Brunilda Capra, MD;  Location: [facility];  Service: Orthopedics;  Laterality: Left;   BACK SURGERY  2007   CARPAL TUNNEL RELEASE Left 01/06/2023   Procedure: LEFT CARPAL TUNNEL RELEASE;  Surgeon: Brunilda Capra, MD;  Location: [facility];  Service: Orthopedics;  Laterality: Left;   CARPECTOMY WITH RADIAL STYLOIDECTOMY  01/05/2012   DeQuervain's Release    CERVICAL FUSION  2007   COLONOSCOPY WITH PROPOFOL  N/A 09/12/2019   Procedure: COLONOSCOPY WITH PROPOFOL ;  Surgeon: Suzette Espy, MD;  Location: [facility];  Service: Endoscopy;  Laterality: N/A;  7:30am   COLONOSCOPY WITH PROPOFOL  N/A 03/04/2020   Dr. Riley Cheadle: Diverticulosis.  Multiple polyps removed.  Several tubular adenomas.  Next colonoscopy in 3 years will require modified bowel prep due to history of inadequate bowel prep before.Aaron Aas   POLYPECTOMY  09/12/2019   Procedure: POLYPECTOMY;  Surgeon: Suzette Espy, MD;  Location: [facility];  Service: Endoscopy;;  hepatic flexure x5; cecal; descending   POLYPECTOMY  03/04/2020   Procedure: POLYPECTOMY;  Surgeon: Suzette Espy, MD;  Location: [facility];  Service: Endoscopy;;   WRIST ARTHROSCOPY  01/05/2012   release of carpal ligament   Social History   Tobacco Use   Smoking status: Every Day    Current packs/day: 0.50    Average packs/day: 0.5 packs/day for 35.0 years (17.5 ttl pk-yrs)    Types: Cigarettes   Smokeless tobacco: Never   Tobacco  comments:    has used cocaine in the past, last 1 yr ago  Vaping Use   Vaping status: Never Used  Substance Use Topics   Alcohol use: No    Alcohol/week: 0.0 standard drinks of alcohol   Drug use: Not Currently    Comment: has used cocaine last used 2023   Family History  Problem Relation Age of Onset   Emphysema Mother    Hypertension Father    Stroke Father    Cancer Maternal Grandmother    Cancer Maternal Grandfather    Ovarian cancer Sister    Colon cancer Neg Hx    Allergies  Allergen Reactions   Clindamycin/Lincomycin Rash   Erythromycin Itching and Rash   Review of Systems  Skin:  Positive for itching and rash (dorsal L hand).     Objective:     BP (!) 145/85   Pulse 80   Ht 5\' 3"  (1.6 m)   Wt 157 lb 9.6 oz (71.5 kg)   SpO2 97%   BMI 27.92 kg/m  BP Readings from Last 3 Encounters:  02/14/24 (!) 145/85  12/15/23 130/75  01/06/23 (!) 117/90   Physical Exam Vitals reviewed.  Constitutional:      General: She is not in acute distress.    Appearance: Normal appearance. She is not toxic-appearing.  HENT:     Head: Normocephalic and atraumatic.     Right Ear: External ear normal.     Left Ear: External ear normal.     Nose: Nose normal. No congestion or rhinorrhea.     Mouth/Throat:     Mouth: Mucous membranes are moist.     Pharynx: Oropharynx is clear. No oropharyngeal exudate or posterior oropharyngeal erythema.  Eyes:     General: No scleral icterus.    Extraocular Movements: Extraocular movements intact.     Conjunctiva/sclera: Conjunctivae normal.     Pupils: Pupils are equal, round, and reactive to light.  Cardiovascular:     Rate and Rhythm: Normal rate and regular rhythm.     Pulses: Normal pulses.     Heart sounds: Normal heart sounds. No murmur heard.    No friction rub. No gallop.  Pulmonary:     Effort: Pulmonary effort is normal.     Breath sounds: Normal breath sounds. No wheezing, rhonchi or rales.  Abdominal:     General: Abdomen  is flat. Bowel sounds are normal. There is no distension.     Palpations: Abdomen is soft.     Tenderness: There is no abdominal tenderness.  Musculoskeletal:        General: No swelling. Normal range of motion.     Cervical back: Normal range of motion.     Right lower leg: No edema.     Left lower leg: No edema.  Lymphadenopathy:     Cervical: No cervical adenopathy.  Skin:    General: Skin is  warm and dry.     Capillary Refill: Capillary refill takes less than 2 seconds.     Coloration: Skin is not jaundiced.     Findings: Rash (Multiple raised, erythematous lesions with evidence of excoriation on the dorsal left hand and posterior neck) present.  Neurological:     General: No focal deficit present.     Mental Status: She is alert and oriented to person, place, and time.  Psychiatric:        Mood and Affect: Mood normal.        Behavior: Behavior normal.   Last CBC Lab Results  Component Value Date   WBC 6.1 12/15/2023   HGB 14.1 12/15/2023   HCT 43.9 12/15/2023   MCV 87 12/15/2023   MCH 28.0 12/15/2023   RDW 14.4 12/15/2023   PLT 228 12/15/2023   Last metabolic panel Lab Results  Component Value Date   GLUCOSE 95 12/15/2023   NA 144 12/15/2023   K 4.5 12/15/2023   CL 106 12/15/2023   CO2 23 12/15/2023   BUN 12 12/15/2023   CREATININE 0.74 12/15/2023   EGFR 91 12/15/2023   CALCIUM 9.4 12/15/2023   PROT 6.3 12/15/2023   ALBUMIN 4.1 12/15/2023   LABGLOB 2.2 12/15/2023   BILITOT 0.4 12/15/2023   ALKPHOS 166 (H) 12/15/2023   AST 17 12/15/2023   ALT 11 12/15/2023   ANIONGAP 11 03/03/2020   Last lipids Lab Results  Component Value Date   CHOL 151 12/15/2023   HDL 55 12/15/2023   LDLCALC 74 12/15/2023   TRIG 124 12/15/2023   CHOLHDL 2.7 12/15/2023   Last hemoglobin A1c Lab Results  Component Value Date   HGBA1C 5.4 12/15/2023   Last thyroid  functions Lab Results  Component Value Date   TSH 0.972 12/15/2023   Last vitamin D  Lab Results  Component  Value Date   VD25OH 36.4 12/15/2023   Last vitamin B12 and Folate Lab Results  Component Value Date   VITAMINB12 204 (L) 12/15/2023   FOLATE 10.3 12/15/2023   The 10-year ASCVD risk score (Arnett DK, et al., 2019) is: 22.5%    Assessment & Plan:   Problem List Items Addressed This Visit       Type 2 diabetes mellitus without complications (HCC)   Under excellent control with diet.  A1c 5.4 on labs from March.  Urine microalbumin/creatinine ratio pending.      Rash of hand   Her acute concern today is a rash on the dorsal aspect of the left hand.  There is evidence of excoriation on exam.  She has tried applying moisturizing creams without improvement.  Recommend use of hydrocortisone cream.  She will follow-up if symptoms do not improve.  Would be reasonable to try higher potency topical steroid at that time.      Insomnia - Primary   She continues to take alprazolam 0.25 mg nightly as needed for insomnia.  Previously managed by her PCP.  PDMP reviewed.  UDS still pending.      Unintentional weight loss   Returning to care today for weight loss follow-up.  She previously endorsed a 45 pound weight loss over the last year.  Her weight at her appointment in March was 155 pounds.  Current weight 157 pounds.  Of note, she had been on Ozempic for a period of 6 months within the last year.  No further workup is indicated at this time.      Vitamin B12 deficiency   Noted on  labs from March.  She has started daily vitamin B12 supplementation.  Repeat B12 level at next follow-up appointment.      Return in about 6 months (around 08/16/2024).   Tobi Fortes, MD

## 2024-02-14 NOTE — Assessment & Plan Note (Addendum)
 Returning to care today for weight loss follow-up.  She previously endorsed a 45 pound weight loss over the last year.  Her weight at her appointment in March was 155 pounds.  Current weight 157 pounds.  Of note, she had been on Ozempic for a period of 6 months within the last year.  No further workup is indicated at this time.

## 2024-02-14 NOTE — Assessment & Plan Note (Signed)
 She continues to take alprazolam 0.25 mg nightly as needed for insomnia.  Previously managed by her PCP.  PDMP reviewed.  UDS still pending.

## 2024-02-14 NOTE — Assessment & Plan Note (Signed)
 Her acute concern today is a rash on the dorsal aspect of the left hand.  There is evidence of excoriation on exam.  She has tried applying moisturizing creams without improvement.  Recommend use of hydrocortisone cream.  She will follow-up if symptoms do not improve.  Would be reasonable to try higher potency topical steroid at that time.

## 2024-02-16 ENCOUNTER — Ambulatory Visit: Payer: Self-pay | Admitting: Internal Medicine

## 2024-02-16 LAB — MICROALBUMIN / CREATININE URINE RATIO
Creatinine, Urine: 6.8 mg/dL
Microalbumin, Urine: <3 ug/mL

## 2024-02-18 LAB — TOXASSURE SELECT 13 (MW), URINE

## 2024-02-28 ENCOUNTER — Other Ambulatory Visit: Payer: Self-pay | Admitting: Acute Care

## 2024-02-28 DIAGNOSIS — S20462A Insect bite (nonvenomous) of left back wall of thorax, initial encounter: Secondary | ICD-10-CM | POA: Diagnosis not present

## 2024-02-28 DIAGNOSIS — L281 Prurigo nodularis: Secondary | ICD-10-CM | POA: Diagnosis not present

## 2024-02-28 DIAGNOSIS — Z87891 Personal history of nicotine dependence: Secondary | ICD-10-CM

## 2024-02-28 DIAGNOSIS — F17210 Nicotine dependence, cigarettes, uncomplicated: Secondary | ICD-10-CM

## 2024-02-28 DIAGNOSIS — Z122 Encounter for screening for malignant neoplasm of respiratory organs: Secondary | ICD-10-CM

## 2024-05-01 DIAGNOSIS — N12 Tubulo-interstitial nephritis, not specified as acute or chronic: Secondary | ICD-10-CM | POA: Diagnosis not present

## 2024-05-01 DIAGNOSIS — E785 Hyperlipidemia, unspecified: Secondary | ICD-10-CM | POA: Diagnosis not present

## 2024-05-01 DIAGNOSIS — R300 Dysuria: Secondary | ICD-10-CM | POA: Diagnosis not present

## 2024-05-01 DIAGNOSIS — N10 Acute tubulo-interstitial nephritis: Secondary | ICD-10-CM | POA: Diagnosis not present

## 2024-05-01 DIAGNOSIS — A419 Sepsis, unspecified organism: Secondary | ICD-10-CM | POA: Diagnosis not present

## 2024-05-01 DIAGNOSIS — N390 Urinary tract infection, site not specified: Secondary | ICD-10-CM | POA: Diagnosis not present

## 2024-05-01 DIAGNOSIS — Z792 Long term (current) use of antibiotics: Secondary | ICD-10-CM | POA: Diagnosis not present

## 2024-05-01 DIAGNOSIS — F17210 Nicotine dependence, cigarettes, uncomplicated: Secondary | ICD-10-CM | POA: Diagnosis not present

## 2024-05-01 DIAGNOSIS — Z5941 Food insecurity: Secondary | ICD-10-CM | POA: Diagnosis not present

## 2024-05-01 DIAGNOSIS — R112 Nausea with vomiting, unspecified: Secondary | ICD-10-CM | POA: Diagnosis not present

## 2024-05-01 DIAGNOSIS — R509 Fever, unspecified: Secondary | ICD-10-CM | POA: Diagnosis not present

## 2024-05-01 DIAGNOSIS — R1031 Right lower quadrant pain: Secondary | ICD-10-CM | POA: Diagnosis not present

## 2024-05-01 DIAGNOSIS — I10 Essential (primary) hypertension: Secondary | ICD-10-CM | POA: Diagnosis not present

## 2024-05-01 DIAGNOSIS — Z9889 Other specified postprocedural states: Secondary | ICD-10-CM | POA: Diagnosis not present

## 2024-05-01 DIAGNOSIS — Z881 Allergy status to other antibiotic agents status: Secondary | ICD-10-CM | POA: Diagnosis not present

## 2024-05-01 DIAGNOSIS — K7689 Other specified diseases of liver: Secondary | ICD-10-CM | POA: Diagnosis not present

## 2024-05-01 DIAGNOSIS — K5730 Diverticulosis of large intestine without perforation or abscess without bleeding: Secondary | ICD-10-CM | POA: Diagnosis not present

## 2024-05-01 DIAGNOSIS — N3000 Acute cystitis without hematuria: Secondary | ICD-10-CM | POA: Diagnosis not present

## 2024-05-01 DIAGNOSIS — J449 Chronic obstructive pulmonary disease, unspecified: Secondary | ICD-10-CM | POA: Diagnosis not present

## 2024-05-01 DIAGNOSIS — Z79899 Other long term (current) drug therapy: Secondary | ICD-10-CM | POA: Diagnosis not present

## 2024-05-01 DIAGNOSIS — R634 Abnormal weight loss: Secondary | ICD-10-CM | POA: Diagnosis not present

## 2024-05-02 DIAGNOSIS — F17210 Nicotine dependence, cigarettes, uncomplicated: Secondary | ICD-10-CM | POA: Diagnosis not present

## 2024-05-02 DIAGNOSIS — I10 Essential (primary) hypertension: Secondary | ICD-10-CM | POA: Diagnosis not present

## 2024-05-02 DIAGNOSIS — N12 Tubulo-interstitial nephritis, not specified as acute or chronic: Secondary | ICD-10-CM | POA: Diagnosis not present

## 2024-05-02 DIAGNOSIS — Z5941 Food insecurity: Secondary | ICD-10-CM | POA: Diagnosis not present

## 2024-05-02 DIAGNOSIS — J449 Chronic obstructive pulmonary disease, unspecified: Secondary | ICD-10-CM | POA: Diagnosis not present

## 2024-05-02 DIAGNOSIS — N390 Urinary tract infection, site not specified: Secondary | ICD-10-CM | POA: Diagnosis not present

## 2024-05-02 DIAGNOSIS — Z9889 Other specified postprocedural states: Secondary | ICD-10-CM | POA: Diagnosis not present

## 2024-05-02 DIAGNOSIS — R634 Abnormal weight loss: Secondary | ICD-10-CM | POA: Diagnosis not present

## 2024-05-02 DIAGNOSIS — E785 Hyperlipidemia, unspecified: Secondary | ICD-10-CM | POA: Diagnosis not present

## 2024-05-02 DIAGNOSIS — Z79899 Other long term (current) drug therapy: Secondary | ICD-10-CM | POA: Diagnosis not present

## 2024-05-02 DIAGNOSIS — N10 Acute tubulo-interstitial nephritis: Secondary | ICD-10-CM | POA: Diagnosis not present

## 2024-05-02 DIAGNOSIS — R112 Nausea with vomiting, unspecified: Secondary | ICD-10-CM | POA: Diagnosis not present

## 2024-05-02 DIAGNOSIS — R509 Fever, unspecified: Secondary | ICD-10-CM | POA: Diagnosis not present

## 2024-05-02 DIAGNOSIS — A419 Sepsis, unspecified organism: Secondary | ICD-10-CM | POA: Diagnosis not present

## 2024-05-02 DIAGNOSIS — Z881 Allergy status to other antibiotic agents status: Secondary | ICD-10-CM | POA: Diagnosis not present

## 2024-05-03 DIAGNOSIS — N390 Urinary tract infection, site not specified: Secondary | ICD-10-CM | POA: Diagnosis not present

## 2024-05-03 DIAGNOSIS — Z79899 Other long term (current) drug therapy: Secondary | ICD-10-CM | POA: Diagnosis not present

## 2024-05-03 DIAGNOSIS — Z5941 Food insecurity: Secondary | ICD-10-CM | POA: Diagnosis not present

## 2024-05-03 DIAGNOSIS — J449 Chronic obstructive pulmonary disease, unspecified: Secondary | ICD-10-CM | POA: Diagnosis not present

## 2024-05-03 DIAGNOSIS — N12 Tubulo-interstitial nephritis, not specified as acute or chronic: Secondary | ICD-10-CM | POA: Diagnosis not present

## 2024-05-03 DIAGNOSIS — F17210 Nicotine dependence, cigarettes, uncomplicated: Secondary | ICD-10-CM | POA: Diagnosis not present

## 2024-05-03 DIAGNOSIS — E785 Hyperlipidemia, unspecified: Secondary | ICD-10-CM | POA: Diagnosis not present

## 2024-05-03 DIAGNOSIS — Z881 Allergy status to other antibiotic agents status: Secondary | ICD-10-CM | POA: Diagnosis not present

## 2024-05-03 DIAGNOSIS — I10 Essential (primary) hypertension: Secondary | ICD-10-CM | POA: Diagnosis not present

## 2024-05-03 DIAGNOSIS — Z9889 Other specified postprocedural states: Secondary | ICD-10-CM | POA: Diagnosis not present

## 2024-05-03 DIAGNOSIS — R112 Nausea with vomiting, unspecified: Secondary | ICD-10-CM | POA: Diagnosis not present

## 2024-05-03 DIAGNOSIS — R509 Fever, unspecified: Secondary | ICD-10-CM | POA: Diagnosis not present

## 2024-05-03 DIAGNOSIS — R634 Abnormal weight loss: Secondary | ICD-10-CM | POA: Diagnosis not present

## 2024-05-04 DIAGNOSIS — J449 Chronic obstructive pulmonary disease, unspecified: Secondary | ICD-10-CM | POA: Diagnosis not present

## 2024-05-04 DIAGNOSIS — Z5941 Food insecurity: Secondary | ICD-10-CM | POA: Diagnosis not present

## 2024-05-04 DIAGNOSIS — N12 Tubulo-interstitial nephritis, not specified as acute or chronic: Secondary | ICD-10-CM | POA: Diagnosis not present

## 2024-05-04 DIAGNOSIS — R509 Fever, unspecified: Secondary | ICD-10-CM | POA: Diagnosis not present

## 2024-05-04 DIAGNOSIS — Z79899 Other long term (current) drug therapy: Secondary | ICD-10-CM | POA: Diagnosis not present

## 2024-05-04 DIAGNOSIS — R112 Nausea with vomiting, unspecified: Secondary | ICD-10-CM | POA: Diagnosis not present

## 2024-05-04 DIAGNOSIS — N390 Urinary tract infection, site not specified: Secondary | ICD-10-CM | POA: Diagnosis not present

## 2024-05-04 DIAGNOSIS — I10 Essential (primary) hypertension: Secondary | ICD-10-CM | POA: Diagnosis not present

## 2024-05-04 DIAGNOSIS — R634 Abnormal weight loss: Secondary | ICD-10-CM | POA: Diagnosis not present

## 2024-05-04 DIAGNOSIS — E785 Hyperlipidemia, unspecified: Secondary | ICD-10-CM | POA: Diagnosis not present

## 2024-05-04 DIAGNOSIS — F17210 Nicotine dependence, cigarettes, uncomplicated: Secondary | ICD-10-CM | POA: Diagnosis not present

## 2024-05-04 DIAGNOSIS — Z881 Allergy status to other antibiotic agents status: Secondary | ICD-10-CM | POA: Diagnosis not present

## 2024-05-04 DIAGNOSIS — Z9889 Other specified postprocedural states: Secondary | ICD-10-CM | POA: Diagnosis not present

## 2024-05-04 NOTE — Discharge Summary (Signed)
 DISCHARGE SUMMARY [facility]   Discharge date:   May 04, 2024 Length of stay:    LOS: 2 days    Discharge Service:   [facility] Hospitalists Discharge Attending Physician: Margart Elsie Dragon, DO Discharge to:    To Home Condition at Discharge:  good Code status:                         Full Code    ______________________________________  Admission HPI    Patient admitted on: 05/02/2024 12:33 PM  Patient admitted by: Margart Elsie Dragon, DO  CHIEF COMPLAINT:  Nausea, vomiting, urinary frequency   Day of admission HPI:   Patient is a 63 year old female who was brought in by her daughters due to increased lethargy, fever, poor appetite, and ill appearance.  Patient reports that she was here yesterday as well for similar symptoms however the man that she was with convinced her to leave.  Of note patient does use crack cocaine per report from daughter.  Patient states that she has had increased urinary frequency, fever, rigors, chills, nausea and vomiting over the past 3 days.  She reports dysuria along with the frequency and sometimes inability to hold her urine again over the past 3 days.  She reports past medical history significant for hypertension, hyperlipidemia, and borderline diabetes.  She had previously been on Ozempic however has not taken this in a while.   Patient admitted on Home O2? - no Patient on home anticoagulant? -  no Patient admitted with Chronic home foley catheter? - no Foley catheter placed or replaced by another service prior to admission? - no   Mental Status on Admission: The patient is Alert and oriented to PERSON The patient is Alert And oriented to TIME The patient is Alert and oriented to LOCATION     Problem List, Assessment & Plan     ASSESSMENT & PLAN (In order of descending acuity)   Early sepsis due to bilateral pyelonephritis - Symptoms improving, patient no longer febrile - Continue PO antibiotics at home - Continue Tylenol   and Motrin alternating for fever if it recurs - Continue antiemetics - Encourage p.o. intake   Urinary tract infection - Patient with findings suggestive of bilateral pyelonephritis on CT scan with ascending infection in ureters and renal pelvis, no current evidence to support abscess formation - Discharge home with Levaquin, continue to follow cultures and adjust abx if needed   History of drug use - Per report from daughter uses crack cocaine - alert and oriented, pleasant and cooperative   ADDITIONAL PATIENT FINDINGS OR OBSERVATIONS  Alert and oriented No flank tenderness HR normal with normal S1/S2 Lungs clear bilaterally ______________________________________  Timeline of Significant Events: Stacey Brooks is a 63 year old female with a history of hypertension, hyperlipidemia, COPD, tobacco abuse, and prior acute pyelonephritis, admitted for evaluation and management of nausea, vomiting, urinary frequency, and fever.  Bilateral Pyelonephritis / Complicated Urinary Tract Infection  She presented with several days of fever (Tmax 104F), rigors, chills, nausea, vomiting, dysuria, and urinary frequency, with CT imaging showing bilateral patchy renal hypoenhancement, perinephric stranding, and urothelial thickening of the renal pelvises, ureters, and bladder, consistent with bilateral pyelonephritis and ascending infection. There was no evidence of renal abscess or acute diverticulitis. Initial management included IV fluids, broad-spectrum antibiotics (vancomycin and ceftriaxone), antipyretics, and antiemetics. Blood and urine cultures were unfortunately obtained after antibiotics were started; blood cultures remain no growth at time of discharge. By 8/14,  her symptoms and fever had improved, and vancomycin and ceftriaxone were discontinued on 8/14. Subsequent documentation clarified that sepsis was considered unlikely, and the primary issue was bilateral pyelonephritis/complicated  UTI.  Volume Status and Renal Function  She received IV fluids for hydration, with creatinine remaining stable (1.10 on 8/13, 1.09 on 8/14). No evidence of acute kidney injury or electrolyte derangements requiring intervention was noted.  Fever and Symptom Control  Fever was managed with acetaminophen  and ibuprofen as needed, with resolution of fever and improvement in comfort documented by 8/14. Nausea and vomiting were controlled with ondansetron  as needed.  Nutrition and Weight Loss  She reported poor oral intake and a 20-pound weight loss over the prior 6 months, with no intake recorded since admission. Nutrition consult recommended oral nutrition supplements and monitoring, but she did not meet criteria for malnutrition at the time of assessment.  COPD  Her respiratory status remained stable throughout the hospitalization, with no documented exacerbation or need for supplemental oxygen.  Hypertension  Blood pressure fluctuated but stabilized within the desired range during the admission, with no acute complications documented.  History of Drug Use  Her daughter reported crack cocaine use; she was monitored for withdrawal symptoms but remained alert, oriented, and cooperative throughout the stay.  Pain and Mobility  Pain was well controlled with PRN acetaminophen , and she maintained full independence in mobility and activities of daily living throughout the hospitalization.  Fall Risk  Fall prevention measures were consistently implemented, and no falls or injuries occurred during the admission.  Allergies  She has documented allergies to erythromycin, clindamycin, and lincomycin, with reactions including rash and itching.  Surgical History  She has a history of hysterectomy and spinal surgery.  Disposition  She was anticipated to be discharged home, with symptoms improved and vital signs stable at last review.    Mental Status On day of Discharge:  The patient is Alert and  oriented to PERSON The patient is Alert And oriented to TIME The patient is Alert and oriented to LOCATION  CODE STATUS :                    Full Code   An advanced care planning discussion is  had with patient and/or patient's decisions maker (documented separately).  Patient discharged on Home O2? - no Patient discharged on home anticoagulant? -  no  Foley Catheter status: None Central Line Status: NONE  Time Spent on Discharge I spent greater than 30 minutes counseling and coordinating care for the discharge of this patient. The patient and I discussed the importance of outpatient follow-up as well as concerning signs and symptoms that would require immediate evaluation by a medical professional. The aforementioned conversation participants understand  and did show insight. I did use teachback to ensure understanding. The above participant/s is aware that not following the discussed plan, recommendations, and follow up can lead to severe negative effects on the patient's health, up to and including death.  Discharge Medications     Your Medication List     STOP taking these medications    escitalopram oxalate 5 MG tablet Commonly known as: LEXAPRO   nitrofurantoin (macrocrystal-monohydrate) 100 MG capsule Commonly known as: MACROBID   predniSONE 20 MG tablet Commonly known as: DELTASONE       START taking these medications    levoFLOXacin 500 MG tablet Commonly known as: LEVAQUIN Take 1 tablet (500 mg total) by mouth daily for 14 days.  CONTINUE taking these medications    cyclobenzaprine 10 MG tablet Commonly known as: FLEXERIL Take 1 tablet (10 mg total) by mouth Three (3) times a day as needed for muscle spasms.   hydrOXYzine 25 MG tablet Commonly known as: ATARAX Take 1 tablet (25 mg total) by mouth every eight (8) hours as needed for itching, allergies or anxiety.   terconazole 0.4 % vaginal cream Commonly known as: TERAZOL 7 Insert 1  applicator into the vagina daily.       _____________________________________  Nutrition:                                  Diet Instructions     Discharge diet (specify)     Discharge Nutrition Therapy: Heart Healthy       Patient does not meet AND/ASPEN criteria for malnutrition at this time (05/03/24 1400)             ___________________________________________  Discharge Instructions   Nutrition:                                  Diet Instructions     Discharge diet (specify)     Discharge Nutrition Therapy: Heart Healthy       Activity:                                   Activity Instructions     Activity as tolerated         Appointments:                          Follow Up:                              Follow Up instructions and Outpatient Referrals    Ambulatory Referral to Palliative Care     Reason for referral: Serious Illness   Is this a palliative referral for:  symptom management goals of care coping support     Requested follow up plan: You would evaluate and manage.   Discharge instructions         Allergies  Allergen Reactions  . Erythromycin Itching, Rash and Other (See Comments)  . Clindamycin Rash  . Erythromycin Base   . Lincomycin      Past Medical History[1]  Past Surgical History[2]   Family History[3]   Current Medications[4]  Imaging  CT Abdomen Pelvis W Contrast Result Date: 05/02/2024 Exam: CT of the Abdomen and Pelvis with Contrast  History: Right lower quadrant abdominal pain and vomiting for 4 days  Technique: After administration of intravenous contrast, contiguous axial images were obtained through the abdomen and pelvis during the portal venous phase. Multiplanar and maximum intensity projection coronal reformats were obtained. AEC (automated exposure control) and/or manual techniques such as size-specific kV and mAs are employed where appropriate to reduce radiation exposure for all CT exams.  Comparison: November 27, 2022    Findings: LOWER CHEST: The lung bases are clear without focal opacities or effusions. Trace pericardial fluid. Heart size is normal.  LIVER: The liver is normal in size. The capsule is smooth. The portal veins are patent veins are patent. Benign cysts are noted throughout the right and  left hepatic lobes. No suspicious lesions.  BILIARY: No radiopaque stones or sludge. No evidence of cholecystitis. No significant intra- or extrahepatic biliary ductal dilation.  PANCREAS: Normal background parenchyma. No focal lesion. Main pancreatic duct normal in caliber.  SPLEEN: Normal size.  ADRENALS: Normal morphology.  KIDNEYS: The kidneys demonstrate normal size and mild cortical thinning. Multiple bilateral patchy areas of regional hypoenhancement involving the left and right kidneys, suspicious for pyelonephritis. There is perinephric strandy bilaterally. Urothelial thickening and hyperenhancement is noted involving the renal pelvises and ureters, suggesting a sending infection. No solid masses or stones.  GASTROINTESTINAL: The stomach, duodenum, small bowel and colon are normal in caliber without dilatation or thickening. Sigmoid diverticulosis. No acute diverticulitis. Normal appendix.  PELVIC ORGANS:  The urinary bladder demonstrates urothelial hyperenhancement and trabeculation, concerning for cystitis. No masses. The uterus is surgically absent. No adnexal masses.  PERITONEUM: No ascites.  LYMPH NODES: No lymphadenopathy.  VASCULATURE: Moderate atherosclerotic plaquing in the abdominal aorta without aneurysm. The IVC is normal.  BONES: Degenerative changes in the hips, and throughout the thoracolumbar spine. No aggressive lytic or sclerotic lesions.  SOFT TISSUES: Normal.    1.    Findings concerning for bilateral pyelonephritis, with patchy areas of hypoenhancement in the left and right kidneys. No evidence of renal abscess 2.    Urothelial thickening and hyperenhancement involving the renal pelvises,  ureters and urinary bladder, concerning for ascending infection. 3.    Normal appendix. 4.    Sigmoid diverticulosis without acute diverticulitis.  Signed (Electronic Signature): 05/02/2024 2:34 PM Signed By: Deward DELENA Brock, MD   Lab Results   Recent Labs    05/04/24 0448  WBC 7.3  HGB 11.3*  HCT 33.4*  PLT 152   Recent Labs    05/02/24 1320 05/02/24 1733 05/03/24 0544 05/04/24 0448  NA 136  --  143 147*  K 3.3*  --  3.7 3.4*  CL 101  --  109* 111*  CO2 25.8  --  26.3 25.7  BUN 12  --  14 15  CREATININE 1.10  --  1.09 0.96  0.94  GLU 122  --  96 93  CALCIUM 8.3*  --  8.2* 8.4*  ALBUMIN 2.9*  --  2.3*  --   PROT 6.6  --  5.7*  --   BILITOT 1.0  --  0.7  --   AST 26  --  29  --   ALT 27  --  28  --   ALKPHOS 120*  --  106  --   MG 1.7  --   --   --   LIPASE 17  --   --   --   LACTATE  --  1.8  --   --    No results for input(s): CKTOTAL, CKMB, PCTCKMB, TROPONINI, EDTPNI, BNP, INR, LABPROT, APTT, DDIMER in the last 72 hours. Recent Labs    05/01/24 2308  WBCUA 338*  NITRITE Negative  LEUKOCYTESUR Large*  BACTERIA Moderate*  RBCUA 3  BLOODU Trace*  GLUCOSEU Negative  PROTEINUA 70 mg/dL*  KETONESU Negative   No results for input(s): OPIAU, BENZU, TRICYCLIC, PCPU, AMPHU, COCAU, CANNAU, BARBU, ETOH, ACETAMIN, SALICYLATE in the last 72 hours. No results for input(s): PREGTESTUR, PREGPOC in the last 72 hours. No results for input(s): OCCULTBLD, RAPSCRN, CDIFRPCR, CDIFFNAP1, A1C, CHOL, LDL, HDL, TRIG in the last 72 hours. No results for input(s): O2SOUR, FIO2ART, PHART, PCO2ART, PO2ART, HCO3ART, O2SATART, BEART in the last 72 hours. Pending Labs  Order Current Status   Urine Culture In process   Blood Culture #1 Preliminary result   Blood Culture #2 Preliminary result       Home Medications   Prior to Admission medications  Medication Dose, Route, Frequency  cyclobenzaprine  (FLEXERIL) 10 MG tablet 10 mg, Oral, 3 times a day PRN  terconazole (TERAZOL 7) 0.4 % vaginal cream 1 applicator, Vaginal, Daily (standard)  escitalopram oxalate (LEXAPRO) 5 MG tablet 5 mg, Daily (standard) Patient not taking: Reported on 05/02/2024  hydrOXYzine (ATARAX) 25 MG tablet 25 mg, Oral, Every 8 hours PRN  levoFLOXacin (LEVAQUIN) 500 MG tablet 500 mg, Oral, Every 24 hours  nitrofurantoin, macrocrystal-monohydrate, (MACROBID) 100 MG capsule 100 mg, Oral, 2 times a day (standard) Patient not taking: Reported on 05/02/2024  predniSONE (DELTASONE) 20 MG tablet 3 po daily for 2 days, then 2 po daily for 2 days, then 1 po daily for 2 days Patient not taking: Reported on 05/02/2024   Margart Dragon, DO Hospitalist, [facility] 05/04/24, 11:48 AM      [1] Past Medical History: Diagnosis Date  . Acute pyelonephritis 10/31/2020  . COPD (chronic obstructive pulmonary disease)       . Hyperlipemia   . Hypertension   . Tobacco abuse   [2] Past Surgical History: Procedure Laterality Date  . HYSTERECTOMY    . SPINAL SX    [3] No family history on file. [4]  Current Facility-Administered Medications:  .  acetaminophen  (TYLENOL ) tablet 650 mg, 650 mg, Oral, Q4H PRN, Vendura, Khoury William, DO, 650 mg at 05/03/24 2331 .  cefTRIAXone (ROCEPHIN) 2 g in sodium chloride  0.9 % (NS) 100 mL IVPB-connector bag, 2 g, Intravenous, Q24H, Dragon Margart Fallow, DO, Stopped at 05/03/24 1357 .  cyclobenzaprine (FLEXERIL) tablet 10 mg, 10 mg, Oral, TID PRN, Vendura, Khoury William, DO .  enoxaparin (LOVENOX) syringe 40 mg, 40 mg, Subcutaneous, Q24H, Vendura, Khoury William, DO, 40 mg at 05/03/24 1957 .  hydrOXYzine (ATARAX) tablet 25 mg, 25 mg, Oral, Q8H PRN, Vendura, Khoury William, DO, 25 mg at 05/03/24 2332 .  ketorolac  (TORADOL ) injection 15 mg, 15 mg, Intravenous, Q6H PRN, Dragon Margart Fallow, DO, 15 mg at 05/03/24 1733 .  ondansetron  (ZOFRAN ) injection 4 mg, 4 mg, Intravenous, Q8H PRN **OR**  ondansetron  (ZOFRAN ) injection 8 mg, 8 mg, Intravenous, Q8H PRN, Dragon Margart Fallow, DO .  vancomycin (VANCOCIN) 750 mg in sodium chloride  0.9 % IVPB (premix), 750 mg, Intravenous, Q18H, Dragon Margart Fallow, DO, Stopped at 05/04/24 [id number]

## 2024-05-04 NOTE — Nursing Note (Signed)
 Patient being discharged home no needs, patient verbalized understanding of discharge instructions, IV removed.

## 2024-05-07 ENCOUNTER — Telehealth: Payer: Self-pay

## 2024-05-07 NOTE — Transitions of Care (Post Inpatient/ED Visit) (Signed)
   05/07/2024  Name: Stacey Brooks MRN: [id number] DOB: June 30, 1961  Today's TOC FU Call Status: Today's TOC FU Call Status:: Unsuccessful Call (1st Attempt) Unsuccessful Call (1st Attempt) Date: 05/07/24  Attempted to reach the patient regarding the most recent Inpatient/ED visit.  Follow Up Plan: Additional outreach attempts will be made to reach the patient to complete the Transitions of Care (Post Inpatient/ED visit) call.    Bing Edison MSN, RN RN Case Sales executive Health  [facility] Office Hours M-F [phone] Direct Dial: [phone] Main Phone [phone]  Fax: [phone] [email or web address]

## 2024-05-09 ENCOUNTER — Telehealth: Payer: Self-pay

## 2024-05-09 NOTE — Transitions of Care (Post Inpatient/ED Visit) (Signed)
   05/09/2024  Name: BAYLEI SIEBELS MRN: [id number] DOB: 10/17/1960  Today's TOC FU Call Status: Today's TOC FU Call Status:: Unsuccessful Call (2nd Attempt) Unsuccessful Call (2nd Attempt) Date: 05/09/24  Attempted to reach the patient regarding the most recent Inpatient/ED visit.  Follow Up Plan: Additional outreach attempts will be made to reach the patient to complete the Transitions of Care (Post Inpatient/ED visit) call.    Bing Edison MSN, RN RN Case Sales executive Health  [facility] Office Hours M-F [phone] Direct Dial: [phone] Main Phone [phone]  Fax: [phone] [email or web address]

## 2024-05-10 ENCOUNTER — Telehealth: Payer: Self-pay

## 2024-05-10 DIAGNOSIS — R634 Abnormal weight loss: Secondary | ICD-10-CM

## 2024-05-10 NOTE — Transitions of Care (Post Inpatient/ED Visit) (Signed)
 05/10/2024  Name: Stacey Brooks MRN: [id number] DOB: [birth year]  Today's TOC FU Call Status: Today's TOC FU Call Status:: Successful TOC FU Call Completed TOC FU Call Complete Date: 05/10/24 Patient's Name and Date of Birth confirmed.  Transition Care Management Follow-up Telephone Call How have you been since you were released from the hospital?: Same (feels weak, right calf pain.) Any questions or concerns?: No  Items Reviewed: Did you receive and understand the discharge instructions provided?: Yes Medications obtained,verified, and reconciled?: Yes (Medications Reviewed) Any new allergies since your discharge?: No Dietary orders reviewed?: Yes Type of Diet Ordered:: Heart healthy diet Do you have support at home?: Yes People in Home [RPT]: child(ren), adult  Medications Reviewed Today: Medications Reviewed Today     Reviewed by Rumalda Alan PENNER, RN (Registered Nurse) on 05/10/24 at 1236  Med List Status: <None>   Medication Order Taking? Sig Documenting Provider Last Dose Status Informant  albuterol (PROVENTIL HFA;VENTOLIN HFA) 108 (90 Base) MCG/ACT inhaler [id number] Yes Inhale 2 puffs into the lungs every 6 (six) hours as needed for wheezing or shortness of breath. [provider]  Active Self  ALPRAZolam (XANAX) 0.25 MG tablet [id number]  Take 0.25 mg by mouth daily.  Patient not taking: Reported on 05/10/2024   [provider]  Active   Fluticasone-Umeclidin-[facility] ELLIPTA) 200-62.5-25 MCG/ACT AEPB [id number]  Inhale into the lungs.  Patient not taking: Reported on 05/10/2024   [provider]  Active   furosemide  (LASIX ) 20 MG tablet [id number]  Take 1 tablet (20 mg total) by mouth daily. Take 1 tablet for 3 days.  Patient not taking: Reported on 05/10/2024   Lavona Agent, MD  Expired 12/31/22 2359   hydrochlorothiazide (HYDRODIURIL) 25 MG tablet [id number]  Take 25 mg by mouth daily.  Patient not taking: Reported on 05/10/2024   [provider]  Active Self  hydrOXYzine (ATARAX) 25 MG tablet [id number] Yes Take 25 mg by mouth 3 (three) times daily as needed for itching. [provider]  Active   levofloxacin (LEVAQUIN) 500 MG tablet [id number] Yes Take 500 mg by mouth daily. [provider]  Active            Med Note (ROSE, ALAN PENNER Schaumann May 10, 2024 12:36 PM) Prescribed by [facility] at discharge.   lisinopril (ZESTRIL) 40 MG tablet [id number]  Take 40 mg by mouth daily.   Patient not taking: Reported on 05/10/2024   [provider]  Active Self  omeprazole  (PRILOSEC) 20 MG capsule [id number]  Take 1 capsule (20 mg total) by mouth 2 (two) times daily before a meal.  Patient not taking: Reported on 05/10/2024   Marvis Camellia LABOR, NP  Active Self  potassium chloride  SA (KLOR-CON  M) 20 MEQ tablet [id number]  Take 20 mEq by mouth daily as needed (when taking Lasix ).  Patient not taking: Reported on 05/10/2024   [provider]  Active   rosuvastatin (CRESTOR) 20 MG tablet [id number]  Take 20 mg by mouth daily.  Patient not taking: Reported on 05/10/2024   [provider]  Active             Home Care and Equipment/Supplies: Were Home Health Services Ordered?: No Any new equipment or medical supplies ordered?: No  Functional Questionnaire: Do you need assistance with bathing/showering or dressing?: No Do you need assistance with meal preparation?: No Do you need assistance with eating?: No Do you have difficulty maintaining continence: Yes Do you need  assistance with getting out of bed/getting out of a chair/moving?: No Do you have difficulty managing or taking your medications?: No  Follow up appointments reviewed: PCP Follow-up appointment confirmed?: Yes Date of PCP follow-up appointment?: 05/16/24 Follow-up Provider: PCP Specialist Hospital Follow-up appointment confirmed?: No Do you need transportation to your follow-up appointment?: No Do you understand care options if your  condition(s) worsen?: Yes-patient verbalized understanding  SDOH Interventions Today    Flowsheet Row Most Recent Value  SDOH Interventions   Food Insecurity Interventions Other (Comment)  [urgent referral to SW]  Housing Interventions Other (Comment)  Transportation Interventions Intervention Not Indicated  Utilities Interventions Intervention Not Indicated   Today's Vitals   05/10/24 1218  PainSc: 10-Worst pain ever     Goals Addressed             This Visit's Progress    [facility] Transitions of Care (TOC) Care Plan       Problems:  Recent Hospitalization for treatment of pyleonephritis SDOH barrier: food insecurity- states she has no food at home right now. Depression- high depression score- stays at home in the bed all the time Right calf pain- new complain- reports tight and tender  Goal:  Over the next 30 days, the patient will not experience hospital readmission  Interventions:  Transitions of Care: Annual Eye Exam education provided regarding need for exam Doctor Visits  - discussed the importance of doctor visits Contacted provider for patient needs secure chat sent to PCP about new onset of right calf pain Post discharge activity limitations prescribed by provider reviewed Reviewed Signs and symptoms of infection Reviewed importance of hydration Reviewed importance of taking all antibiotics as prescribed. Reviewed importance of avoid drugs Discussed smoking cessation Reviewed depression and completed depression screening Refrerral to LCSW for food insecurity ( emergent) no food at home Referral to LCSW for high depression screening. Patient consented to have a LCSW call her. Reviewed and offered 30 day TOC program and patient has consented.  Next telephone call scheduled with Bing Edison RN Screening for signs and symptoms of depression related to chronic disease state  Assessed social determinant of health barriers Reviewed with patient about sending message to  PCP about calf. She reports that she can go to the office if needed. Also reviewed with patient that she can go to the ED. Patient voiced understanding.  Awaiting response from PCP. This note routed urgently as well.   Patient Self Care Activities:  Attend all scheduled provider appointments Call pharmacy for medication refills 3-7 days in advance of running out of medications Call provider office for new concerns or questions  Notify RN Care Manager of TOC call rescheduling needs Participate in Transition of Care Program/Attend TOC scheduled calls Perform all self care activities independently  Take medications as prescribed   Work with the social worker to address care coordination needs and will continue to work with the clinical team to address health care and disease management related needs call the Suicide and Crisis Lifeline: 988 call the [place]  National Suicide Prevention Lifeline: 6182165323 or TTY: 6163479003 TTY 661 821 2191) to talk to a trained counselor call 1-800-273-TALK (toll free, 24 hour hotline) call 911 if experiencing a Mental Health or Behavioral Health Crisis  Plan:  Telephone follow up appointment with care management team member scheduled for:  05/16/2024  at 130pm with Bing Edison RN The patient has been provided with contact information for the care management team and has been advised to call with any health related questions  or concerns.        Patient with 2 urgent concerns:  Right calf pain- secure message sent to MD and this noted urgently Food insecurity- emergent referral to LCSW   Alan Ee, RN, BSN, CEN Population Health- Transition of Care Team.  [facility] [phone]

## 2024-05-14 ENCOUNTER — Other Ambulatory Visit: Payer: Self-pay

## 2024-05-14 DIAGNOSIS — F32A Depression, unspecified: Secondary | ICD-10-CM

## 2024-05-14 NOTE — Telephone Encounter (Signed)
 Rescheduled

## 2024-05-14 NOTE — Telephone Encounter (Signed)
 Pt advised with verbal understanding

## 2024-05-14 NOTE — Patient Outreach (Signed)
 Complex Care Management   Visit Note  05/14/2024  Name:  Stacey Brooks MRN: [id number] DOB: Aug 28, 1961  Situation: Referral received for Complex Care Management related to SDOH Barriers:  Housing Rental assistance Food insecurity Financial Resource Strain I obtained verbal consent from Patient.  Visit completed with Patient  on the phone  Background:   Past Medical History:  Diagnosis Date   ANA positive    Anxiety    Asthma    Intermittent; asthmatic bronchitis   Cancer (HCC)    skin cancer L arm   Carpal tunnel syndrome on right 10/11/2008   Cervical nerve root impingement 10/03/2012   Cervical spondylolysis 10/03/2012   Chronic back pain    Colon polyps 08/25/2012   Constipation    COPD (chronic obstructive pulmonary disease) (HCC) 06/25/2011   De Quervain's disease (tenosynovitis) 07/02/2013   Depression 06/25/2011   Diabetes mellitus without complication (HCC)    Diverticulosis 08/25/2012   Fatty liver 06/10/2015   Fibromyalgia 06/30/2009   GERD (gastroesophageal reflux disease) 07/03/2003   Heme + stool 04/11/2019   Hemorrhoids 07/01/2014   Hirsutism    Hypercholesterolemia 07/05/2003   Mild   Hyperlipidemia    Hypertension    IBS (irritable bowel syndrome) 2010   Insomnia 2010   Lumbar degenerative disc disease    Migraine 08/17/2019   Neural foraminal stenosis of cervical spine    Obesity 08/23/2013   Obstructive chronic bronchitis (HCC)    Osteoarthritis 06/25/2011   Pilar cyst 07/30/2011   Polymyalgia (HCC) 03/31/2009   SS-A antibody positive 08/22/2012   Suicide attempt (HCC) 06/25/2011   Thyroid  disease    Hypothyroidism   Tobacco abuse 07/03/2003   Trigger middle finger 05/30/2008    Assessment:  BSW spoke to patient today to address food insecurity. During the call, BSW conducted an SDOH screening to assess for any additional barriers. Aside from food insecurity, the patient identified needs related to housing, financial strain, and vision  care. Patient reported that she received food from her daughter this past weekend, but currently has no appetite and is unable to eat. Patient also stated that she requires an appointment with an eye doctor as she needs glasses to see at night when driving. She reported that she is saving up $44 to cover the cost of the initial eye exam. During the assessment, patient disclosed that she is one and a half months behind on rent, and her landlord has threatened eviction despite her having lived in the residence for 13 years and previously paying rent on time. Patient also reported being on a payment plan with [employer] in order to get caught up on her electricity bill. BSW will send patient resources addressing food insecurity, housing, financial strain, and vision care. BSW has scheduled patient with LCSW Brooke Joyce for support related to depression. No further follow-up is required at this time, and patient has been informed to contact social worker if any additional needs arise.   Food Resources [facility] [employer] [facility]) Food Pantry [address], [place], [place] [address] [phone] [employer] [address] [place], [place] [address] [phone] [facility] [address], [place], [place] [address] [phone] Housing / Eviction Prevention [facility] [address] [place], [place], [place] [address] [phone] [facility] DSS - Emergency Assistance (rent/utility help) [address] [place], [place] [address] [phone] Financial & Utility Support [employer] - Payment Plans / Assistance  [phone] [facility] DSS - Low Income Energy Assistance (ARLINE / Crisis Program) [phone] Vision Care  [facility]  [address] [place], [place], [place] [address] [phone] [facility] - Affordable Exams/Glasses [address] [place], [place] [address] [phone]   SDOH Interventions Today     Flowsheet Row Most Recent Value  SDOH Interventions   Food Insecurity Interventions Community Resources Provided  Housing Interventions Community Resources Provided  Financial Strain Interventions Community Resources Provided     Recommendation:   No recommendations at this time  Follow Up Plan:   Referral to Licensed Clinical Social Worker  Orlean Fey, [place]  [facility] [employer] Social Worker, [employer] [phone]

## 2024-05-14 NOTE — Patient Instructions (Signed)
 Visit Information  Thank you for taking time to visit with me today. Please don't hesitate to contact me if I can be of assistance to you before our next scheduled appointment.  Our next appointment is no further scheduled appointments.   Please call the care guide team at [phone] if you need to cancel or reschedule your appointment.      Please call the Suicide and Crisis Lifeline: 988 call the [place]  National Suicide Prevention Lifeline: 5614008894 or TTY: (979) 689-2988 TTY (404) 719-2111) to talk to a trained counselor call 1-800-273-TALK (toll free, 24 hour hotline) call the [facility]: [phone] call 911 if you are experiencing a Mental Health or Behavioral Health Crisis or need someone to talk to.  Patient verbalizes understanding of instructions and care plan provided today and agrees to view in MyChart. Active MyChart status and patient understanding of how to access instructions and care plan via MyChart confirmed with patient.     Orlean Fey, BSW [place]  [facility] Social Worker, [employer] [facility] [phone]

## 2024-05-16 ENCOUNTER — Inpatient Hospital Stay: Payer: Self-pay

## 2024-05-17 ENCOUNTER — Other Ambulatory Visit: Payer: Self-pay

## 2024-05-17 NOTE — Transitions of Care (Post Inpatient/ED Visit) (Signed)
 Transition of Care week 2  Visit Note  05/17/2024  Name: Stacey Brooks MRN: [id number]          DOB: August 13, 1961  Situation: Patient enrolled in [facility] 30-day program. Visit completed with patient by telephone.   Background:   Initial Transition Care Management Follow-up Telephone Call    Past Medical History:  Diagnosis Date   ANA positive    Anxiety    Asthma    Intermittent; asthmatic bronchitis   Cancer (HCC)    skin cancer L arm   Carpal tunnel syndrome on right 10/11/2008   Cervical nerve root impingement 10/03/2012   Cervical spondylolysis 10/03/2012   Chronic back pain    Colon polyps 08/25/2012   Constipation    COPD (chronic obstructive pulmonary disease) (HCC) 06/25/2011   De Quervain's disease (tenosynovitis) 07/02/2013   Depression 06/25/2011   Diabetes mellitus without complication (HCC)    Diverticulosis 08/25/2012   Fatty liver 06/10/2015   Fibromyalgia 06/30/2009   GERD (gastroesophageal reflux disease) 07/03/2003   Heme + stool 04/11/2019   Hemorrhoids 07/01/2014   Hirsutism    Hypercholesterolemia 07/05/2003   Mild   Hyperlipidemia    Hypertension    IBS (irritable bowel syndrome) 2010   Insomnia 2010   Lumbar degenerative disc disease    Migraine 08/17/2019   Neural foraminal stenosis of cervical spine    Obesity 08/23/2013   Obstructive chronic bronchitis (HCC)    Osteoarthritis 06/25/2011   Pilar cyst 07/30/2011   Polymyalgia (HCC) 03/31/2009   SS-A antibody positive 08/22/2012   Suicide attempt (HCC) 06/25/2011   Thyroid  disease    Hypothyroidism   Tobacco abuse 07/03/2003   Trigger middle finger 05/30/2008    Assessment: Patient Reported Symptoms: Cognitive Cognitive Status: No symptoms reported, Alert and oriented to person, place, and time, Normal speech and language skills, Able to follow simple commands      Neurological Neurological Review of Symptoms: Dizziness Neurological Comment: Feel lightheaded even when lying down.   HEENT HEENT Symptoms Reported: No symptoms reported      Cardiovascular Cardiovascular Symptoms Reported: No symptoms reported Does patient have uncontrolled Hypertension?: No Cardiovascular Self-Management Outcome: 3 (uncertain)  Respiratory Respiratory Symptoms Reported: Dry cough Other Respiratory Symptoms: Chronic cough. Additional Respiratory Details: Still smoking but not as much. Respiratory Management Strategies: Routine screening, Coping strategies, Medication therapy Respiratory Self-Management Outcome: 4 (good)  Endocrine Endocrine Symptoms Reported: Unintentional weight loss (lost 20 more lbs, due to no appetite) Is patient diabetic?: Yes Is patient checking blood sugars at home?: No (Needs equipment when sees PCP) Endocrine Self-Management Outcome: 3 (uncertain)  Gastrointestinal Gastrointestinal Symptoms Reported: Unintentional weight loss, Change in appetite (143 lbs reported today.) Additional Gastrointestinal Details: Has no appetite. Gastrointestinal Comment: Regular bowell movements    Genitourinary Genitourinary Symptoms Reported: Other Other Genitourinary Symptoms: Feels like has had to go after voiding normal amount. Additional Genitourinary Details: Has taken all medications. Genitourinary Management Strategies: Coping strategies, Medication therapy, Fluid modification Genitourinary Comment: reviewed hydration  Integumentary Integumentary Symptoms Reported: Itching Skin Management Strategies: Medication therapy Skin Self-Management Outcome: 3 (uncertain)  Musculoskeletal Musculoskelatal Symptoms Reviewed: Joint pain Additional Musculoskeletal Details: Reports pain in joints, calf pain has eased up, had to reschedule PCP appt due to no gas. Musculoskeletal Management Strategies: Adequate rest, Coping strategies, Routine screening Falls in the past year?: No    Psychosocial Psychosocial Symptoms Reported: Sadness - if selected complete PHQ 2-9 Additional  Psychological Details: Depsressed and anxious about financial and health situation. Behavioral Management  Strategies: Medication therapy, Coping strategies Major Change/Loss/Stressor/Fears (CP): Resources, Medical condition, self Do you feel physically threatened by others?: No   There were no vitals filed for this visit.  Medications Reviewed Today     Reviewed by Carolee Heron NOVAK, RN (Case Manager) on 05/17/24 at 1446  Med List Status: <None>   Medication Order Taking? Sig Documenting Provider Last Dose Status Informant  albuterol (PROVENTIL HFA;VENTOLIN HFA) 108 (90 Base) MCG/ACT inhaler [id number] Yes Inhale 2 puffs into the lungs every 6 (six) hours as needed for wheezing or shortness of breath. [provider]  Active Self  ALPRAZolam (XANAX) 0.25 MG tablet [id number] Yes Take 0.25 mg by mouth daily. [provider]  Active   Fluticasone-Umeclidin-Vilant SHELLI ELLIPTA) 200-62.5-25 MCG/ACT AEPB [id number] Yes Inhale into the lungs. [provider]  Active   furosemide  (LASIX ) 20 MG tablet [id number]  Take 1 tablet (20 mg total) by mouth daily. Take 1 tablet for 3 days.  Patient not taking: Reported on 05/17/2024   Lavona Agent, MD  Expired 12/31/22 2359   hydrochlorothiazide (HYDRODIURIL) 25 MG tablet [id number] Yes Take 25 mg by mouth daily. [provider]  Active Self  hydrOXYzine (ATARAX) 25 MG tablet [id number] Yes Take 25 mg by mouth 3 (three) times daily as needed for itching. [provider]  Active   levofloxacin (LEVAQUIN) 500 MG tablet [id number] Yes Take 500 mg by mouth daily. [provider]  Active            Med Note (ROSE, ALAN RAYMOND Schaumann May 10, 2024 12:36 PM) Prescribed by [facility] at discharge.   lisinopril (ZESTRIL) 40 MG tablet [id number] Yes Take 40 mg by mouth daily.  [provider]  Active Self  omeprazole  (PRILOSEC) 20 MG capsule [id number]  Take 1 capsule (20 mg total) by mouth 2 (two) times daily before a meal.   Patient not taking: Reported on 05/17/2024   Marvis Camellia LABOR, NP  Active Self  potassium chloride  SA (KLOR-CON  M) 20 MEQ tablet [id number]  Take 20 mEq by mouth daily as needed (when taking Lasix ).  Patient not taking: Reported on 05/17/2024   [provider]  Active   rosuvastatin (CRESTOR) 20 MG tablet [id number]  Take 20 mg by mouth daily.  Patient not taking: Reported on 05/17/2024   [provider]  Active             Goals Addressed             This Visit's Progress    VBCI Transitions of Care (TOC) Care Plan   On track    Reviewed or upated on 05/17/24 TOC week 2 call.   Problems:  Recent Hospitalization for treatment of pyleonephritis SDOH barrier: food insecurity- states she has no food at home right now. Financial strain: On disability but rent $500 and Car payment $500, leaving $500 for everything else for a month.  Received resources from [place],  [place] to [employer].  Depression- high depression score- stays at home in the bed all the time. TOC LCSW appt on 05/29/24 scheduled/reviewed with patient.  Right calf pain- pain has eased up, was unable to get to PCP appointment due to no gas, rescheduled for 06/01/24.  If worsens, redness, or heat develops encouraged to contact 911.  Goal:  Over the next 30 days, the patient will not experience hospital readmission  Interventions:  Transitions of Care: Annual Eye Exam education provided regarding need for exam Doctor Visits  -  discussed the importance of doctor visits. Encouraged to make a list of things to discuss when able to see PCP Weight loss/no appetite.  Why was palliative referral made, patient unaware but has appointment with [facility] on 05/22/24 for palliative consult.  Contacted provider for patient needs secure chat sent to PCP about new onset of right calf pain:  05/17/24 patient states this pain has ease up but has other joint pain from fibromyalgia.  If worsens, redness, or heat develops  encouraged to contact 911. Post discharge activity limitations prescribed by provider reviewed Reviewed Signs and symptoms of infection. Reviewed importance of hydration. Reviewed importance of taking all antibiotics as prescribed. Reviewed importance of avoid drugs. Discussed smoking cessation.  Still smoking but not as much, discussed cessation/cost of smoking.  Reviewed depression and completed depression screening LCSW on 9/925 scheduled.  Refrerral to LCSW for food insecurity ( emergent) no food at home. Patient contacted local resources given by BSW but since no children in household, unable to help.  Went to a local food bank this week for some food.  Referral to LCSW for high depression screening.  Patient consented to have a LCSW call her on 05/29/24 a call is scheduled.  Reviewed and offered 30 day TOC program and patient has consented.   Screening for signs and symptoms of depression related to chronic disease state  Assessed social determinant of health barriers Reviewed with patient about sending message to PCP about calf. She reports that she can go to the office if needed. Also reviewed with patient that she can go to the ED. Patient voiced understanding.  Awaiting response from PCP.  Patient has to cancel PCP appt due to not having any gas, rescheduled for 06/01/24.  Allowed time for patient to speak about issues and troubles, had no family support, new to the area last year, low resources in the area, discussed [email or web address] as has internet access.   Patient Self Care Activities:  Attend all scheduled provider appointments Call pharmacy for medication refills 3-7 days in advance of running out of medications Call provider office for new concerns or questions  Notify RN Care Manager of TOC call rescheduling needs Participate in Transition of Care Program/Attend TOC scheduled calls Perform all self care activities independently  Take medications as prescribed   Work with the  social worker to address care coordination needs and will continue to work with the clinical team to address health care and disease management related needs call the Suicide and Crisis Lifeline: 988 call the [place]  National Suicide Prevention Lifeline: 2026506784 or TTY: 3652351147 TTY (563) 887-0476) to talk to a trained counselor call 1-800-273-TALK (toll free, 24 hour hotline) call 911 if experiencing a Mental Health or Behavioral Health Crisis Explore [email or web address] for different zip codes to move to with more resources if has to move.   Plan:  Telephone follow up appointment with care management team member scheduled for:  05/24/24 at 230 pm with Bing Edison RN The patient has been provided with contact information for the care management team and has been advised to call with any health related questions or concerns.         Recommendation:   PCP Follow-up Continue Current Plan of Care  Follow Up Plan:   Telephone follow-up in 1 week   Bing Edison MSN, RN RN Case Manager [facility]  VBCI-Population Health Office Hours M-F [phone] Direct Dial: [phone] Main Phone [phone]  Fax: [phone] [email or web address]

## 2024-05-17 NOTE — Patient Instructions (Signed)
 Visit Information  Thank you for taking time to visit with me today. Please don't hesitate to contact me if I can be of assistance to you before our next scheduled telephone appointment.  Our next appointment is by telephone on 05/24/24 at  Following is a copy of your care plan:   Goals Addressed             This Visit's Progress    VBCI Transitions of Care (TOC) Care Plan   On track    Reviewed or upated on 05/17/24 TOC week 2 call.   Problems:  Recent Hospitalization for treatment of pyleonephritis SDOH barrier: food insecurity- states she has no food at home right now. Financial strain: On disability but rent $500 and Car payment $500, leaving $500 for everything else for a month.  Received resources from [place],  [place] to [employer].  Depression- high depression score- stays at home in the bed all the time. TOC LCSW appt on 05/29/24 scheduled/reviewed with patient.  Right calf pain- pain has eased up, was unable to get to PCP appointment due to no gas, rescheduled for 06/01/24.  If worsens, redness, or heat develops encouraged to contact 911.  Goal:  Over the next 30 days, the patient will not experience hospital readmission  Interventions:  Transitions of Care: Annual Eye Exam education provided regarding need for exam Doctor Visits  - discussed the importance of doctor visits. Encouraged to make a list of things to discuss when able to see PCP Weight loss/no appetite.  Why was palliative referral made, patient unaware but has appointment with [facility] on 05/22/24 for palliative consult.  Contacted provider for patient needs secure chat sent to PCP about new onset of right calf pain:  05/17/24 patient states this pain has ease up but has other joint pain from fibromyalgia.  If worsens, redness, or heat develops encouraged to contact 911. Post discharge activity limitations prescribed by provider reviewed Reviewed Signs and symptoms of infection. Reviewed importance of  hydration. Reviewed importance of taking all antibiotics as prescribed. Reviewed importance of avoid drugs. Discussed smoking cessation.  Still smoking but not as much, discussed cessation/cost of smoking.  Reviewed depression and completed depression screening LCSW on 9/925 scheduled.  Refrerral to LCSW for food insecurity ( emergent) no food at home. Patient contacted local resources given by BSW but since no children in household, unable to help.  Went to a local food bank this week for some food.  Referral to LCSW for high depression screening.  Patient consented to have a LCSW call her on 05/29/24 a call is scheduled.  Reviewed and offered 30 day TOC program and patient has consented.   Screening for signs and symptoms of depression related to chronic disease state  Assessed social determinant of health barriers Reviewed with patient about sending message to PCP about calf. She reports that she can go to the office if needed. Also reviewed with patient that she can go to the ED. Patient voiced understanding.  Awaiting response from PCP.  Patient has to cancel PCP appt due to not having any gas, rescheduled for 06/01/24.  Allowed time for patient to speak about issues and troubles, had no family support, new to the area last year, low resources in the area, discussed [email or web address] as has internet access.   Patient Self Care Activities:  Attend all scheduled provider appointments Call pharmacy for medication refills 3-7 days in advance of running out of medications Call provider office for new concerns  or questions  Financial trader of TOC call rescheduling needs Participate in Transition of Care Program/Attend TOC scheduled calls Perform all self care activities independently  Take medications as prescribed   Work with the social worker to address care coordination needs and will continue to work with the clinical team to address health care and disease management related  needs call the Suicide and Crisis Lifeline: 988 call the [place]  National Suicide Prevention Lifeline: 864-501-6058 or TTY: 408 331 3728 TTY 217-543-1683) to talk to a trained counselor call 1-800-273-TALK (toll free, 24 hour hotline) call 911 if experiencing a Mental Health or Behavioral Health Crisis Explore [email or web address] for different zip codes to move to with more resources if has to move.   Plan:  Telephone follow up appointment with care management team member scheduled for:  05/24/24 at 230 pm with Bing Edison RN The patient has been provided with contact information for the care management team and has been advised to call with any health related questions or concerns.         Patient verbalizes understanding of instructions and care plan provided today and agrees to view in MyChart. Active MyChart status and patient understanding of how to access instructions and care plan via MyChart confirmed with patient.     Telephone follow up appointment with care management team member scheduled for: 05/24/24 at 230 pm  Please call the care guide team at [phone] if you need to cancel or reschedule your appointment.   Please call the Suicide and Crisis Lifeline: 988 call 1-800-273-TALK (toll free, 24 hour hotline) call the [facility]: 425-532-5457 call 911 if you are experiencing a Mental Health or Behavioral Health Crisis or need someone to talk to.   Bing Edison MSN, RN RN Case Sales executive Health  VBCI-Population Health Office Hours M-F [phone] Direct Dial: [phone] Main Phone [phone]  Fax: [phone] [email or web address]

## 2024-05-24 ENCOUNTER — Telehealth: Payer: Self-pay

## 2024-05-29 ENCOUNTER — Telehealth: Admitting: Licensed Clinical Social Worker

## 2024-06-01 ENCOUNTER — Telehealth: Payer: Self-pay

## 2024-06-01 ENCOUNTER — Other Ambulatory Visit: Payer: Self-pay

## 2024-06-01 ENCOUNTER — Ambulatory Visit (INDEPENDENT_AMBULATORY_CARE_PROVIDER_SITE_OTHER)

## 2024-06-01 DIAGNOSIS — I2510 Atherosclerotic heart disease of native coronary artery without angina pectoris: Secondary | ICD-10-CM

## 2024-06-01 DIAGNOSIS — I10 Essential (primary) hypertension: Secondary | ICD-10-CM

## 2024-06-01 DIAGNOSIS — K219 Gastro-esophageal reflux disease without esophagitis: Secondary | ICD-10-CM

## 2024-06-01 DIAGNOSIS — R195 Other fecal abnormalities: Secondary | ICD-10-CM | POA: Diagnosis not present

## 2024-06-01 DIAGNOSIS — E538 Deficiency of other specified B group vitamins: Secondary | ICD-10-CM

## 2024-06-01 DIAGNOSIS — E785 Hyperlipidemia, unspecified: Secondary | ICD-10-CM

## 2024-06-01 DIAGNOSIS — E119 Type 2 diabetes mellitus without complications: Secondary | ICD-10-CM

## 2024-06-01 DIAGNOSIS — E1169 Type 2 diabetes mellitus with other specified complication: Secondary | ICD-10-CM | POA: Diagnosis not present

## 2024-06-01 DIAGNOSIS — Z23 Encounter for immunization: Secondary | ICD-10-CM | POA: Diagnosis not present

## 2024-06-01 DIAGNOSIS — M797 Fibromyalgia: Secondary | ICD-10-CM

## 2024-06-01 DIAGNOSIS — Z1231 Encounter for screening mammogram for malignant neoplasm of breast: Secondary | ICD-10-CM

## 2024-06-01 MED ORDER — OZEMPIC (0.25 OR 0.5 MG/DOSE) 2 MG/1.5ML ~~LOC~~ SOPN: 0.2500 mg | PEN_INJECTOR | SUBCUTANEOUS | 3 refills | Status: DC

## 2024-06-01 MED ORDER — LISINOPRIL 40 MG PO TABS: 40.0000 mg | ORAL_TABLET | Freq: Every day | ORAL | 2 refills | Status: DC

## 2024-06-01 MED ORDER — ROSUVASTATIN CALCIUM 20 MG PO TABS: 20.0000 mg | ORAL_TABLET | Freq: Every day | ORAL | 2 refills | Status: AC

## 2024-06-01 MED ORDER — OMEPRAZOLE 40 MG PO CPDR: 40.0000 mg | DELAYED_RELEASE_CAPSULE | Freq: Two times a day (BID) | ORAL | 2 refills | Status: AC

## 2024-06-01 MED ORDER — MELOXICAM 7.5 MG PO TABS: 7.5000 mg | ORAL_TABLET | Freq: Every day | ORAL | 2 refills | Status: DC | PRN

## 2024-06-01 MED ORDER — CYCLOBENZAPRINE HCL 10 MG PO TABS: 10.0000 mg | ORAL_TABLET | Freq: Three times a day (TID) | ORAL | 5 refills | Status: AC | PRN

## 2024-06-01 MED ORDER — CYANOCOBALAMIN 1000 MCG/ML IJ SOLN
1000.0000 ug | INTRAMUSCULAR | Status: DC
  Administered 2024-06-01 – 2024-07-11 (×2): 1000 ug via INTRAMUSCULAR

## 2024-06-01 NOTE — Transitions of Care (Post Inpatient/ED Visit) (Signed)
 Transition of Care week 3  Visit Note  06/01/2024  Name: Stacey Brooks MRN: [id number]          DOB: 31-Jan-1961  Situation: Patient enrolled in [facility] 30-day program. Visit completed with patient by telephone.   Background:   Initial Transition Care Management Follow-up Telephone Call    Past Medical History:  Diagnosis Date   ANA positive    Anxiety    Asthma    Intermittent; asthmatic bronchitis   Cancer (HCC)    skin cancer L arm   Carpal tunnel syndrome on right 10/11/2008   Cervical nerve root impingement 10/03/2012   Cervical spondylolysis 10/03/2012   Chronic back pain    Colon polyps 08/25/2012   Constipation    COPD (chronic obstructive pulmonary disease) (HCC) 06/25/2011   De Quervain's disease (tenosynovitis) 07/02/2013   Depression 06/25/2011   Diabetes mellitus without complication (HCC)    Diverticulosis 08/25/2012   Fatty liver 06/10/2015   Fibromyalgia 06/30/2009   GERD (gastroesophageal reflux disease) 07/03/2003   Heme + stool 04/11/2019   Hemorrhoids 07/01/2014   Hirsutism    Hypercholesterolemia 07/05/2003   Mild   Hyperlipidemia    Hypertension    IBS (irritable bowel syndrome) 2010   Insomnia 2010   Lumbar degenerative disc disease    Migraine 08/17/2019   Neural foraminal stenosis of cervical spine    Obesity 08/23/2013   Obstructive chronic bronchitis (HCC)    Osteoarthritis 06/25/2011   Pilar cyst 07/30/2011   Polymyalgia (HCC) 03/31/2009   SS-A antibody positive 08/22/2012   Suicide attempt (HCC) 06/25/2011   Thyroid  disease    Hypothyroidism   Tobacco abuse 07/03/2003   Trigger middle finger 05/30/2008    Assessment: Patient Reported Symptoms: Cognitive Cognitive Status: No symptoms reported, Insightful and able to interpret abstract concepts, Normal speech and language skills, Alert and oriented to person, place, and time      Neurological Neurological Review of Symptoms: Weakness Neurological Management Strategies:  Routine screening, Activity, Adequate rest, Coping strategies, Medication therapy Neurological Self-Management Outcome: 3 (uncertain) Neurological Comment: Symptoms she was admitted for have started to return, patient reported, lethargy, chronic pain from head to toes, history of fibromyalgia. Sees PCP today, 06/01/24.  HEENT HEENT Symptoms Reported: No symptoms reported      Cardiovascular Cardiovascular Symptoms Reported: Swelling in legs or feet Does patient have uncontrolled Hypertension?: No (Has not been taking blood pressure at home since discharge. Reviewed/educated on rationale for taking at home and keeping log and taking to provider appointments over spot checks in provider office. Patient verbalized understanding, has equipment.) Cardiovascular Management Strategies: Activity, Coping strategies, Medication therapy, Routine screening, Medical device Weight: 146 lb (66.2 kg) (This was last weight reported on 05/17/24 phone call. Patient has not weighed since discharge, Has been getting meal delivered after [facility] TOC outreach.) Cardiovascular Self-Management Outcome: 3 (uncertain) Cardiovascular Comment: Patient reports chest discomfort as heartburn as she had not been taking medication for this but has starting back on medication review. Has PCP appointment today but encouraged to see immediate help if worsens or other symtpoms appear along with chest discomfort.  Respiratory Respiratory Symptoms Reported: Productive cough Other Respiratory Symptoms: Chronic cough esp. in the am but patient feels it is getting worse. No fever reported. Additional Respiratory Details: Still not smoking as much mostly due to cost. Respiratory Management Strategies: Activity, Medication therapy, Routine screening Respiratory Self-Management Outcome: 3 (uncertain)  Endocrine Endocrine Symptoms Reported: Unintentional weight loss Is patient diabetic?: Yes Is patient checking  blood sugars at home?: No Endocrine  Self-Management Outcome: 3 (uncertain) Endocrine Comment: Needs new meter or new test strips she says she is unable to find any longer. To speak to PCP today at appointment about this.  Gastrointestinal Gastrointestinal Symptoms Reported: Reflux/heartburn, Unintentional weight loss Additional Gastrointestinal Details: Moms' Meals being delivered. Gastrointestinal Management Strategies: Coping strategies, Activity, Medication therapy    Genitourinary Genitourinary Symptoms Reported: Pain/burning with urination, Urgency, Frequency Other Genitourinary Symptoms: Symptoms returning from hospitalization issues. Reports voiding a normal amount but residual burning and feeing the need to still have to void. Additional Genitourinary Details: Completed round of antibiotics post discharge. Genitourinary Management Strategies: Fluid modification, Coping strategies, Medication therapy Genitourinary Comment: Hydration reviewed but also has some reported feet swelling, not on Lasix  now, Has PCP appointment this afternoon to request assessment.  Integumentary Integumentary Symptoms Reported: Itching Skin Management Strategies: Medication therapy Skin Self-Management Outcome: 3 (uncertain)  Musculoskeletal Musculoskelatal Symptoms Reviewed: Joint pain Additional Musculoskeletal Details: Reports return of chronic pain from head to toe; history of fibromyalgia Musculoskeletal Management Strategies: Activity, Adequate rest, Coping strategies, Routine screening, Medication therapy Musculoskeletal Self-Management Outcome: 3 (uncertain) Falls in the past year?: No    Psychosocial Psychosocial Symptoms Reported: Anger Additional Psychological Details: Anger and Frustration about health and financial situation and lack of resources available. Behavioral Management Strategies: Medication therapy, Optimal nutrition intake, Coping strategies, Abstinence from substances, Adequate rest, Community resources, Activity,  Support system Behavioral Health Self-Management Outcome: 3 (uncertain) Behavioral Health Comment: Allowed time for patient to discuss frustration and anger over health issues and lack of resources to help people in need. Major Change/Loss/Stressor/Fears (CP): Medical condition, self, Resources Quality of Family Relationships:  (Reports daughter that is not helpful or supportive at this time.) Do you feel physically threatened by others?: No   There were no vitals filed for this visit.  Medications Reviewed Today     Reviewed by Carolee Heron NOVAK, RN (Case Manager) on 06/01/24 at 0914  Med List Status: <None>   Medication Order Taking? Sig Documenting Provider Last Dose Status Informant  albuterol (PROVENTIL HFA;VENTOLIN HFA) 108 (90 Base) MCG/ACT inhaler [id number] Yes Inhale 2 puffs into the lungs every 6 (six) hours as needed for wheezing or shortness of breath. [provider]  Active Self  ALPRAZolam (XANAX) 0.25 MG tablet [id number] Yes Take 0.25 mg by mouth daily. [provider]  Active   Fluticasone-Umeclidin-Vilant SHELLI ELLIPTA) 200-62.5-25 MCG/ACT AEPB [id number] Yes Inhale into the lungs. [provider]  Active   furosemide  (LASIX ) 20 MG tablet [id number]  Take 1 tablet (20 mg total) by mouth daily. Take 1 tablet for 3 days.  Patient not taking: Reported on 06/01/2024   Lavona Agent, MD  Expired 12/31/22 2359   hydrochlorothiazide (HYDRODIURIL) 25 MG tablet [id number] Yes Take 25 mg by mouth daily. [provider]  Active Self  hydrOXYzine (ATARAX) 25 MG tablet [id number] Yes Take 25 mg by mouth 3 (three) times daily as needed for itching. [provider]  Active   levofloxacin (LEVAQUIN) 500 MG tablet [id number]  Take 500 mg by mouth daily.  Patient not taking: Reported on 06/01/2024   [provider]  Active            Med Note (ROSE, ALAN RAYMOND Schaumann May 10, 2024 12:36 PM) Prescribed by [facility] at discharge.   lisinopril  (ZESTRIL )  40 MG tablet [id number] Yes Take 40 mg by mouth daily.  [provider]  Active Self  omeprazole  (PRILOSEC) 20  MG capsule [id number] Yes Take 1 capsule (20 mg total) by mouth 2 (two) times daily before a meal. Marvis Camellia LABOR, NP  Active Self  potassium chloride  SA (KLOR-CON  M) 20 MEQ tablet [id number]  Take 20 mEq by mouth daily as needed (when taking Lasix ).  Patient not taking: Reported on 06/01/2024   [provider]  Active   rosuvastatin  (CRESTOR ) 20 MG tablet [id number] Yes Take 20 mg by mouth daily. [provider]  Active             Goals Addressed             This Visit's Progress    VBCI Transitions of Care (TOC) Care Plan   On track    Reviewed or upated on 06/01/24 TOC week 3 call.   Problems:  Recent Hospitalization for treatment of pyleonephritis Patient reports returning symptoms but no fever.  Has PCP appointment this afternoon to assess.  SDOH barrier: food insecurity Financial strain: On disability but rent $500 and Car payment $500, leaving $500 for everything else for a month.  Received resources from [facility] ambulatory referral. Has begun receiving Mom's Meals with two deliveries so far.   Went to [employer].  Depression- high depression score- stays at home in the bed all the time. Subjectively seems better but reports feeling bad with UTI like symptoms returning, lethargy, and chronic pain returning.  PCP appointment this afternoon--patient to report this to PCP.  TOC LCSW appt on 05/29/24 scheduled/reviewed with patient.  COMPLETED.  Resources received, Mom's Meals started deliveries.  Over the next 30 days, the patient will not experience hospital readmission  Interventions:  Transitions of Care: Annual Eye Exam education provided regarding need for exam. Patient states she needs new glasses.  Doctor Visits  - discussed the importance of doctor visits. Encouraged to make a list of things to discuss when able to see PCP Weight  loss/no appetite.  Last reported weight was 146 lbs after discharge.  Why was palliative referral made, patient unaware but has appointment with [facility] on 05/22/24 for palliative consult.  Rescheduled for 108/25 per patient reported.  Contacted provider for patient needs secure chat sent to PCP about new onset of right calf pain:  05/17/24 patient states this pain has ease up but has other joint pain from fibromyalgia.  If worsens, redness, or heat develops encouraged to contact 911. No issues reported with this specific issue om 06/01/24.  Post discharge activity limitations prescribed by provider reviewed Reviewed Signs and symptoms of infection. Reviewed importance of hydration. Reviewed importance of taking all antibiotics as prescribed. Completed discharge round of antibiotics Reports return of urinary symptoms on call today.  Reviewed importance of avoid drugs. Discussed smoking cessation.  Still smoking but not as much, discussed cessation/cost of smoking.  Reviewed depression and completed depression screening LCSW on 05/29/24 scheduled.  COMPLETED Refrerral to LCSW for food insecurity ( emergent) no food at home. Patient contacted local resources given by BSW but since no children in household, unable to help.  Went to a local food bank this week for some food.  Referral outreach completed.  Moms' Meals being delivered.  Referral to LCSW for high depression screening.  Patient consented to have a LCSW call her on 05/29/24 a call is scheduled.  Completed.  Reviewed and offered 30 day TOC program and patient has consented.   Screening for signs and symptoms of depression related to chronic disease state  RE-assessed social determinant of health barriers  Patient has to cancel PCP appt due to not having any gas, rescheduled for 06/01/24.  Patient reported she has gas to go to appointment today.  Allowed time for patient to speak about issues and troubles, had no family support, new to  the area last year, low resources in the area, discussed [email or web address] as has internet access.  Discussed/educated on rationale for monitoring blood pressures, weights and blood sugar at least 3X/week.  Has blood pressure cuff Will discuss with PCP: needs new CBG meter as unable to find supplies for current meter.  Has not been checking blood sugars at home: reviewed rationale for this along with blood pressures and weights.   Patient Self Care Activities:  Attend all scheduled provider appointments Call pharmacy for medication refills 3-7 days in advance of running out of medications Call provider office for new concerns or questions  Notify RN Care Manager of TOC call rescheduling needs Participate in Transition of Care Program/Attend TOC scheduled calls Perform all self care activities independently  Take medications as prescribed   Work with the social worker to address care coordination needs and will continue to work with the clinical team to address health care and disease management related needs call the Suicide and Crisis Lifeline: 988 call the [place]  National Suicide Prevention Lifeline: (309) 824-7683 or TTY: (365) 025-0775 TTY 949 159 0615) to talk to a trained counselor call 1-800-273-TALK (toll free, 24 hour hotline) call 911 if experiencing a Mental Health or Behavioral Health Crisis Explore [email or web address] for different zip codes to move to with more resources if has to move.  Start taking blood pressure, checking blood sugars when obtains equipment, and weights at least 2X/week MWF and log to take to providers Rationale/education provided.   Plan:  Telephone follow up appointment with care management team member scheduled for:  06/01/24 at 930 am with Bing Edison RN The patient has been provided with contact information for the care management team and has been advised to call with any health related questions or concerns.          Recommendation:   PCP Follow-up Complete  PCP HFU today. 06/01/24, as scheduled and discuss current issues/symptoms/barriers/equipment needs. Confirmed patient has gasoline to make appointment today.   Follow Up Plan:   Telephone follow-up in 1 week   Bing Edison MSN, RN RN Case Manager [facility]  VBCI-Population Health Office Hours M-F [phone] Direct Dial: [phone] Main Phone [phone]  Fax: [phone] [email or web address]

## 2024-06-01 NOTE — Patient Instructions (Signed)
 Visit Information  Thank you for taking time to visit with me today. Please don't hesitate to contact me if I can be of assistance to you before our next scheduled telephone appointment.  Our next appointment is by telephone on 06/01/24 at 930 am  Following is a copy of your care plan:   Goals Addressed             This Visit's Progress    VBCI Transitions of Care (TOC) Care Plan   On track    Reviewed or upated on 06/01/24 TOC week 3 call.   Problems:  Recent Hospitalization for treatment of pyleonephritis Patient reports returning symptoms but no fever.  Has PCP appointment this afternoon to assess.  SDOH barrier: food insecurity Financial strain: On disability but rent $500 and Car payment $500, leaving $500 for everything else for a month.  Received resources from [facility] ambulatory referral. Has begun receiving Mom's Meals with two deliveries so far.   Went to [employer].  Depression- high depression score- stays at home in the bed all the time. Subjectively seems better but reports feeling bad with UTI like symptoms returning, lethargy, and chronic pain returning.  PCP appointment this afternoon--patient to report this to PCP.  TOC LCSW appt on 05/29/24 scheduled/reviewed with patient.  COMPLETED.  Resources received, [employer]'s Meals started deliveries.  Over the next 30 days, the patient will not experience hospital readmission  Interventions:  Transitions of Care: Annual Eye Exam education provided regarding need for exam. Patient states she needs new glasses.  Doctor Visits  - discussed the importance of doctor visits. Encouraged to make a list of things to discuss when able to see PCP Weight loss/no appetite.  Last reported weight was 146 lbs after discharge.  Why was palliative referral made, patient unaware but has appointment with [facility] on 05/22/24 for palliative consult.  Rescheduled for 108/25 per patient reported.  Contacted provider for patient needs secure chat  sent to PCP about new onset of right calf pain:  05/17/24 patient states this pain has ease up but has other joint pain from fibromyalgia.  If worsens, redness, or heat develops encouraged to contact 911. No issues reported with this specific issue om 06/01/24.  Post discharge activity limitations prescribed by provider reviewed Reviewed Signs and symptoms of infection. Reviewed importance of hydration. Reviewed importance of taking all antibiotics as prescribed. Completed discharge round of antibiotics Reports return of urinary symptoms on call today.  Reviewed importance of avoid drugs. Discussed smoking cessation.  Still smoking but not as much, discussed cessation/cost of smoking.  Reviewed depression and completed depression screening LCSW on 05/29/24 scheduled.  COMPLETED Refrerral to LCSW for food insecurity ( emergent) no food at home. Patient contacted local resources given by BSW but since no children in household, unable to help.  Went to a local food bank this week for some food.  Referral outreach completed.  Moms' Meals being delivered.  Referral to LCSW for high depression screening.  Patient consented to have a LCSW call her on 05/29/24 a call is scheduled.  Completed.  Reviewed and offered 30 day TOC program and patient has consented.   Screening for signs and symptoms of depression related to chronic disease state  RE-assessed social determinant of health barriers Patient has to cancel PCP appt due to not having any gas, rescheduled for 06/01/24.  Patient reported she has gas to go to appointment today.  Allowed time for patient to speak about issues and troubles, had  no family support, new to the area last year, low resources in the area, discussed [email or web address] as has internet access.  Discussed/educated on rationale for monitoring blood pressures, weights and blood sugar at least 3X/week.  Has blood pressure cuff Will discuss with PCP: needs new CBG meter as unable to  find supplies for current meter.  Has not been checking blood sugars at home: reviewed rationale for this along with blood pressures and weights.   Patient Self Care Activities:  Attend all scheduled provider appointments Call pharmacy for medication refills 3-7 days in advance of running out of medications Call provider office for new concerns or questions  Notify RN Care Manager of TOC call rescheduling needs Participate in Transition of Care Program/Attend TOC scheduled calls Perform all self care activities independently  Take medications as prescribed   Work with the social worker to address care coordination needs and will continue to work with the clinical team to address health care and disease management related needs call the Suicide and Crisis Lifeline: 988 call the [place]  National Suicide Prevention Lifeline: 260-474-9786 or TTY: 984 754 2604 TTY (610) 453-5031) to talk to a trained counselor call 1-800-273-TALK (toll free, 24 hour hotline) call 911 if experiencing a Mental Health or Behavioral Health Crisis Explore [email or web address] for different zip codes to move to with more resources if has to move.  Start taking blood pressure, checking blood sugars when obtains equipment, and weights at least 2X/week MWF and log to take to providers Rationale/education provided.   Plan:  Telephone follow up appointment with care management team member scheduled for:  06/01/24 at 930 am with Bing Edison RN The patient has been provided with contact information for the care management team and has been advised to call with any health related questions or concerns.         Patient verbalizes understanding of instructions and care plan provided today and agrees to view in MyChart. Active MyChart status and patient understanding of how to access instructions and care plan via MyChart confirmed with patient.     Telephone follow up appointment with care management team member scheduled for:Our next  appointment is by telephone on 06/01/24 at 930 am  Please call the care guide team at [phone] if you need to cancel or reschedule your appointment.   Please call the Suicide and Crisis Lifeline: 988 call 1-800-273-TALK (toll free, 24 hour hotline) call 911 if you are experiencing a Mental Health or Behavioral Health Crisis or need someone to talk to.   Bing Edison MSN, RN RN Case Sales executive Health  VBCI-Population Health Office Hours M-F [phone] Direct Dial: [phone] Main Phone [phone]  Fax: [phone] [email or web address]

## 2024-06-01 NOTE — Progress Notes (Signed)
 Established Patient Office Visit  Subjective   Patient ID: Stacey Brooks, female    DOB: 1960-09-29  Age: 63 y.o. MRN: [id number]  Chief Complaint  Patient presents with   Hospitalization Follow-up    Pt here for hospital follow up     HPI Discussed the use of AI scribe software for clinical note transcription with the patient, who gave verbal consent to proceed.  History of Present Illness   Stacey Brooks is a 62 year old female who presents with new onset chest pain after an ER visit.  Chest pain and trauma - New onset chest pain began last night, initially attributed to acid reflux - Pain was severe enough to prevent sleep - Currently no active chest pain, but lingering soreness persists - Recent fall a few days ago with direct impact to chest on kitchen sink, resulting in transient dyspnea - Uncertainty if chest pain is related to recent trauma  Generalized musculoskeletal pain and degenerative joint disease - Pain from shoulders down to toes, particularly at night - Pain affects mobility and sleep quality - History of degenerative disc disease - Tenderness, 'big knots,' and bone spurs in knees and hips - Sharp pains and tingling in arm, associated with degenerative condition - Previously managed with hydrocodone  and Flexeril , but unable to continue due to financial constraints and change in healthcare providers  Peripheral edema - History of swelling in feet, occasionally severe enough to prevent wearing shoes - Uses hydrochlorothiazide as needed for swelling  Gastroesophageal reflux disease - Frequent use of omeprazole  for acid reflux - Initial chest pain episode thought to be related to acid reflux  Diabetes mellitus - Concern about A1c levels - Previously used Ozempic , but discontinued due to adverse effects with dose increase  Hyperlipidemia and hypertension - Currently taking rosuvastatin  and lisinopril , requires refills  Anxiety and emotional  distress - History of anxiety, which exacerbates pain and leads to increased emotional distress - Uses hydroxyzine as needed for anxiety  Gynecologic history - History of hysterectomy at age 56 - No hormone therapy since hysterectomy  Atherosclerotic disease - History of plaque buildup identified during previous hospital visit      Patient Active Problem List   Diagnosis Date Noted   Fibromyalgia 06/05/2024   Coronary artery calcification seen on CT scan 06/05/2024   Vitamin B12 deficiency 02/14/2024   Rash of hand 02/14/2024   Essential hypertension 01/24/2024   COPD (chronic obstructive pulmonary disease) (HCC) 01/24/2024   Bilateral lower extremity edema 01/24/2024   Insomnia 01/24/2024   Type 2 diabetes mellitus without complications (HCC) 01/24/2024   Hyperlipidemia associated with type 2 diabetes mellitus (HCC) 01/24/2024   Unintentional weight loss 01/24/2024   Breast cancer screening by mammogram 01/24/2024   Tobacco use 01/24/2024   Hypertrichosis, idiopathic 06/18/2019   GERD (gastroesophageal reflux disease) 04/11/2019   IBS (irritable bowel syndrome) 2010   Hx of adenomatous colonic polyps 2010   ROS    Objective:     There were no vitals taken for this visit. BP Readings from Last 3 Encounters:  02/14/24 (!) 145/85  12/15/23 130/75  01/06/23 (!) 117/90   Wt Readings from Last 3 Encounters:  06/01/24 146 lb (66.2 kg)  02/14/24 157 lb 9.6 oz (71.5 kg)  12/15/23 155 lb 12.8 oz (70.7 kg)      Physical Exam Vitals and nursing note reviewed.  Constitutional:      Appearance: Normal appearance.  HENT:     Head: Normocephalic.  Right Ear: Tympanic membrane, ear canal and external ear normal.     Left Ear: Tympanic membrane, ear canal and external ear normal.     Nose: Nose normal.     Mouth/Throat:     Mouth: Mucous membranes are moist.     Pharynx: Oropharynx is clear.  Eyes:     Extraocular Movements: Extraocular movements intact.     Pupils:  Pupils are equal, round, and reactive to light.  Cardiovascular:     Rate and Rhythm: Normal rate and regular rhythm.  Pulmonary:     Effort: Pulmonary effort is normal.     Breath sounds: Normal breath sounds.  Musculoskeletal:     Cervical back: Normal range of motion and neck supple.  Skin:    General: Skin is warm and dry.  Neurological:     Mental Status: She is alert and oriented to person, place, and time.  Psychiatric:        Mood and Affect: Mood normal.        Thought Content: Thought content normal.     Results for orders placed or performed in visit on 06/01/24  Bayer DCA Hb A1c Waived  Result Value Ref Range   HB A1C (BAYER DCA - WAIVED) 5.0 4.8 - 5.6 %      The 10-year ASCVD risk score (Arnett DK, et al., 2019) is: 27.5%    Assessment & Plan:   Problem List Items Addressed This Visit       Cardiovascular and Mediastinum   Essential hypertension - Primary   Essential hypertension managed with lisinopril . - Refill lisinopril  prescription.      Relevant Medications   rosuvastatin  (CRESTOR ) 20 MG tablet   lisinopril  (ZESTRIL ) 40 MG tablet   meloxicam  (MOBIC ) 7.5 MG tablet   Coronary artery calcification seen on CT scan   - Refer to cardiology for evaluation of heart calcifications.      Relevant Medications   rosuvastatin  (CRESTOR ) 20 MG tablet   lisinopril  (ZESTRIL ) 40 MG tablet   Other Relevant Orders   Ambulatory referral to Cardiology     Digestive   GERD (gastroesophageal reflux disease)   Intermittent chest pain likely related to gastroesophageal reflux disease with increased frequency of omeprazole  use. - Increase omeprazole  to 40 mg. - referral placed to cardiology       Relevant Medications   omeprazole  (PRILOSEC) 40 MG capsule     Endocrine   Type 2 diabetes mellitus without complications (HCC)   Type 2 diabetes mellitus with previous A1c of 5.4. Previous issues with higher doses of Ozempic  causing sickness. Plan to restart at the  lowest dose. - Restart Ozempic  at the lowest dose. - POC A1C in office today was 5.0      Relevant Medications   rosuvastatin  (CRESTOR ) 20 MG tablet   lisinopril  (ZESTRIL ) 40 MG tablet   Semaglutide ,0.25 or 0.5MG /DOS, (OZEMPIC , 0.25 OR 0.5 MG/DOSE,) 2 MG/1.5ML SOPN   Other Relevant Orders   Bayer DCA Hb A1c Waived (Completed)   Hyperlipidemia associated with type 2 diabetes mellitus (HCC)   Hyperlipidemia managed with rosuvastatin . - Refill rosuvastatin  prescription.      Relevant Medications   rosuvastatin  (CRESTOR ) 20 MG tablet   lisinopril  (ZESTRIL ) 40 MG tablet   Semaglutide ,0.25 or 0.5MG /DOS, (OZEMPIC , 0.25 OR 0.5 MG/DOSE,) 2 MG/1.5ML SOPN     Other   Vitamin B12 deficiency   Vitamin B12 deficiency with difficulty remembering to take oral supplements. Discussed poor absorption of oral B12 unless sublingual. -  Administer B12 injection.      Relevant Medications   cyanocobalamin  (VITAMIN B12) injection 1,000 mcg   Fibromyalgia   Chronic pain from degenerative disc disease, exacerbated by recent fall, with widespread pain affecting sleep and daily activities. Discussed degenerative disc disease, arthritis, and potential nerve involvement. Consider Lyrica or Cymbalta for nerve pain. - Prescribe Flexeril  for muscle relaxation. - Recommend stretching exercises, specifically Yoga.- Suggest Epsom salt baths for muscle relaxation.      Relevant Medications   cyclobenzaprine  (FLEXERIL ) 10 MG tablet   meloxicam  (MOBIC ) 7.5 MG tablet   Other Visit Diagnoses       Immunization due       Relevant Orders   Flu Vaccine QUAD 59mo+IM (Fluarix, Fluzone & Alfiuria Quad PF) (Completed)     Heme + stool       Relevant Medications   omeprazole  (PRILOSEC) 40 MG capsule     Encounter for screening mammogram for malignant neoplasm of breast       Relevant Orders   MM 3D SCREENING MAMMOGRAM BILATERAL BREAST        No follow-ups on file.    Leita Longs, FNP

## 2024-06-04 ENCOUNTER — Other Ambulatory Visit: Payer: Self-pay | Admitting: Licensed Clinical Social Worker

## 2024-06-04 LAB — BAYER DCA HB A1C WAIVED: HB A1C (BAYER DCA - WAIVED): 5 % (ref 4.8–5.6)

## 2024-06-04 NOTE — Patient Outreach (Signed)
 Complex Care Management   Visit Note  06/04/2024  Name:  Stacey Brooks MRN: [id number] DOB: 10-16-1960  Situation: Referral received for Complex Care Management related to Mental/Behavioral Health diagnosis depression management. I obtained verbal consent from Patient.  Visit completed with Patient  on the phone  Background:   Past Medical History:  Diagnosis Date   ANA positive    Anxiety    Asthma    Intermittent; asthmatic bronchitis   Cancer (HCC)    skin cancer L arm   Carpal tunnel syndrome on right 10/11/2008   Cervical nerve root impingement 10/03/2012   Cervical spondylolysis 10/03/2012   Chronic back pain    Colon polyps 08/25/2012   Constipation    COPD (chronic obstructive pulmonary disease) (HCC) 06/25/2011   De Quervain's disease (tenosynovitis) 07/02/2013   Depression 06/25/2011   Diabetes mellitus without complication (HCC)    Diverticulosis 08/25/2012   Fatty liver 06/10/2015   Fibromyalgia 06/30/2009   GERD (gastroesophageal reflux disease) 07/03/2003   Heme + stool 04/11/2019   Hemorrhoids 07/01/2014   Hirsutism    Hypercholesterolemia 07/05/2003   Mild   Hyperlipidemia    Hypertension    IBS (irritable bowel syndrome) 2010   Insomnia 2010   Lumbar degenerative disc disease    Migraine 08/17/2019   Neural foraminal stenosis of cervical spine    Obesity 08/23/2013   Obstructive chronic bronchitis (HCC)    Osteoarthritis 06/25/2011   Pilar cyst 07/30/2011   Polymyalgia (HCC) 03/31/2009   SS-A antibody positive 08/22/2012   Suicide attempt (HCC) 06/25/2011   Thyroid  disease    Hypothyroidism   Tobacco abuse 07/03/2003   Trigger middle finger 05/30/2008   Patient Reported Symptoms: Patient denied needing BH referral at this time. Patient denies any SI/HI at this time. She denies any clinical social work needs at this time. She reports having an appointment with [facility] for talk therapy this month and will contact this [facility] LCSW back if  needed but denies needing complex case management at this time. She is aware of her local mental health resources in the area. Crisis resource education provided as well. [facility] TOC RNCM updated.   06/04/2024    PHQ2-9 Depression Screening   Little interest or pleasure in doing things Several days  Feeling down, depressed, or hopeless Several days  PHQ-2 - Total Score 2  Trouble falling or staying asleep, or sleeping too much Not at all  Feeling tired or having little energy Several days  Poor appetite or overeating  Several days  Feeling bad about yourself - or that you are a failure or have let yourself or your family down Not at all  Trouble concentrating on things, such as reading the newspaper or watching television Not at all  Moving or speaking so slowly that other people could have noticed.  Or the opposite - being so fidgety or restless that you have been moving around a lot more than usual Not at all  Thoughts that you would be better off dead, or hurting yourself in some way Not at all  PHQ2-9 Total Score 4  If you checked off any problems, how difficult have these problems made it for you to do your work, take care of things at home, or get along with other people Not difficult at all  Depression Interventions/Treatment Community Resources Provided (Patient denied needing BH referrals at this time. Reports having an appoitnment with [facility] for talk therapy this month and will contact this [facility]  LCSW back if needed but does not need complex case management at this time)    There were no vitals filed for this visit.  Medications Reviewed Today     Reviewed by Merlynn Lyle CROME, LCSW (Social Worker) on 06/04/24 at 1242  Med List Status: <None>   Medication Order Taking? Sig Documenting Provider Last Dose Status Informant  albuterol (PROVENTIL HFA;VENTOLIN HFA) 108 (90 Base) MCG/ACT inhaler [id number]  Inhale 2 puffs into the lungs every 6 (six) hours as needed for wheezing or  shortness of breath. [provider]  Active Self  ALPRAZolam (XANAX) 0.25 MG tablet [id number]  Take 0.25 mg by mouth daily. [provider]  Active   cyanocobalamin  (VITAMIN B12) injection 1,000 mcg [id number]   Huenink, Laura, FNP  Active   cyclobenzaprine  (FLEXERIL ) 10 MG tablet [id number]  Take 1 tablet (10 mg total) by mouth 3 (three) times daily as needed for muscle spasms. Bevely Doffing, FNP  Active   Fluticasone-Umeclidin-Vilant (TRELEGY ELLIPTA) 200-62.5-25 MCG/ACT AEPB [id number]  Inhale into the lungs. [provider]  Active   hydrochlorothiazide (HYDRODIURIL) 25 MG tablet [id number]  Take 25 mg by mouth daily. [provider]  Active Self  hydrOXYzine (ATARAX) 25 MG tablet [id number]  Take 25 mg by mouth 3 (three) times daily as needed for itching. [provider]  Active   lisinopril  (ZESTRIL ) 40 MG tablet [id number]  Take 1 tablet (40 mg total) by mouth daily. Bevely Doffing, FNP  Active   meloxicam  (MOBIC ) 7.5 MG tablet [id number]  Take 1 tablet (7.5 mg total) by mouth daily as needed for pain. Bevely Doffing, FNP  Active   omeprazole  (PRILOSEC) 40 MG capsule [id number]  Take 1 capsule (40 mg total) by mouth 2 (two) times daily before a meal. Bevely Doffing, FNP  Active   rosuvastatin  (CRESTOR ) 20 MG tablet [id number]  Take 1 tablet (20 mg total) by mouth daily. Bevely Doffing, FNP  Active   Semaglutide ,0.25 or 0.5MG /DOS, (OZEMPIC , 0.25 OR 0.5 MG/DOSE,) 2 MG/1.5ML SOPN [id number]  Inject 0.25 mg into the skin every 7 (seven) days. Bevely Doffing, FNP  Active             Recommendation:   PCP Follow-up Continue Current Plan of Care Contact [facility] LCSW directly at [phone] if you wish to gain behavioral health support Attend therapy appointment at [facility] this month  Lyle Merlynn, BSW, MSW, LCSW Licensed Clinical Social Worker [employer]   [email or web address] Direct Dial: [phone]

## 2024-06-04 NOTE — Patient Instructions (Signed)
 Visit Information  Thank you for taking time to visit with me today. Please don't hesitate to contact me if I can be of assistance to you in the future!  Please call the Suicide and Crisis Lifeline: 988 go to [facility] Urgent [facility] [address], [place] [phone]) call the [facility] Crisis Line: [phone] call 911 if you are experiencing a Mental Health or Behavioral Health Crisis or need someone to talk to.  Patient verbalizes understanding of instructions and care plan provided today and agrees to view in MyChart. Active MyChart status and patient understanding of how to access instructions and care plan via MyChart confirmed with patient.     Lyle Rung, BSW, MSW, LCSW Licensed Clinical Social Worker [employer]   [facility] [email or web address] Direct Dial: [phone]

## 2024-06-05 ENCOUNTER — Ambulatory Visit: Payer: Self-pay

## 2024-06-05 DIAGNOSIS — M797 Fibromyalgia: Secondary | ICD-10-CM | POA: Insufficient documentation

## 2024-06-05 DIAGNOSIS — I2510 Atherosclerotic heart disease of native coronary artery without angina pectoris: Secondary | ICD-10-CM | POA: Insufficient documentation

## 2024-06-05 NOTE — Assessment & Plan Note (Signed)
 Vitamin B12 deficiency with difficulty remembering to take oral supplements. Discussed poor absorption of oral B12 unless sublingual. - Administer B12 injection.

## 2024-06-05 NOTE — Assessment & Plan Note (Signed)
 Hyperlipidemia managed with rosuvastatin . - Refill rosuvastatin  prescription.

## 2024-06-05 NOTE — Assessment & Plan Note (Signed)
 Type 2 diabetes mellitus with previous A1c of 5.4. Previous issues with higher doses of Ozempic  causing sickness. Plan to restart at the lowest dose. - Restart Ozempic  at the lowest dose. - POC A1C in office today was 5.0

## 2024-06-05 NOTE — Assessment & Plan Note (Signed)
 Intermittent chest pain likely related to gastroesophageal reflux disease with increased frequency of omeprazole  use. - Increase omeprazole  to 40 mg. - referral placed to cardiology

## 2024-06-05 NOTE — Assessment & Plan Note (Signed)
 Essential hypertension managed with lisinopril . - Refill lisinopril  prescription.

## 2024-06-05 NOTE — Assessment & Plan Note (Signed)
 Chronic pain from degenerative disc disease, exacerbated by recent fall, with widespread pain affecting sleep and daily activities. Discussed degenerative disc disease, arthritis, and potential nerve involvement. Consider Lyrica or Cymbalta for nerve pain. - Prescribe Flexeril  for muscle relaxation. - Recommend stretching exercises, specifically Yoga.- Suggest Epsom salt baths for muscle relaxation.

## 2024-06-05 NOTE — Assessment & Plan Note (Signed)
-   Refer to cardiology for evaluation of heart calcifications.

## 2024-06-08 ENCOUNTER — Telehealth: Payer: Self-pay

## 2024-06-12 ENCOUNTER — Telehealth: Payer: Self-pay

## 2024-06-12 DIAGNOSIS — J449 Chronic obstructive pulmonary disease, unspecified: Secondary | ICD-10-CM

## 2024-06-12 DIAGNOSIS — E119 Type 2 diabetes mellitus without complications: Secondary | ICD-10-CM

## 2024-06-12 DIAGNOSIS — M797 Fibromyalgia: Secondary | ICD-10-CM

## 2024-06-12 NOTE — Patient Instructions (Addendum)
 Visit Information  Thank you for taking time to visit with me today. Please don't hesitate to contact me if I can be of assistance to you before our next scheduled telephone appointment.  The next appointment will be referred to [facility] Coordinator for Complex Care Coordination needs.  Following is a copy of your care plan:   Goals Addressed             This Visit's Progress    [facility] Transitions of Care (TOC) Care Plan   On track    Reviewed or upated on 06/01/24 TOC week 3 call.   Problems:  Recent Hospitalization for treatment of pyleonephritis Patient reports returning symptoms but no fever.  Has PCP appointment this afternoon to assess.  SDOH barrier: food insecurity Financial strain: On disability but rent $500 and Car payment $500, leaving $500 for everything else for a month.  Received resources from [facility] ambulatory referral. Has begun receiving [employer] with two deliveries so far.   Went to [employer].  Depression- high depression score- stays at home in the bed all the time. Subjectively seems better but reports feeling bad with UTI like symptoms returning, lethargy, and chronic pain returning.  PCP appointment this afternoon--patient to report this to PCP.  TOC LCSW appt on 05/29/24 scheduled/reviewed with patient.  COMPLETED.  Resources received, [employer] started deliveries.  Over the next 30 days, the patient will not experience hospital readmission  Interventions:  Transitions of Care: Annual Eye Exam education provided regarding need for exam. Patient states she needs new glasses.  Doctor Visits  - discussed the importance of doctor visits. Encouraged to make a list of things to discuss when able to see PCP Weight loss/no appetite.  Last reported weight was 146 lbs after discharge.  Why was palliative referral made, patient unaware but has appointment with [facility] on 05/22/24 for palliative consult.  Rescheduled for 108/25 per patient reported.   Contacted provider for patient needs secure chat sent to PCP about new onset of right calf pain:  05/17/24 patient states this pain has ease up but has other joint pain from fibromyalgia.  If worsens, redness, or heat develops encouraged to contact 911. No issues reported with this specific issue om 06/01/24.  Post discharge activity limitations prescribed by provider reviewed Reviewed Signs and symptoms of infection. Reviewed importance of hydration. Reviewed importance of taking all antibiotics as prescribed. Completed discharge round of antibiotics Reports return of urinary symptoms on call today.  Reviewed importance of avoid drugs. Discussed smoking cessation.  Still smoking but not as much, discussed cessation/cost of smoking.  Reviewed depression and completed depression screening LCSW on 05/29/24 scheduled.  COMPLETED Refrerral to LCSW for food insecurity ( emergent) no food at home. Patient contacted local resources given by BSW but since no children in household, unable to help.  Went to a local food bank this week for some food.  Referral outreach completed.  [employer] Meals being delivered.  Referral to LCSW for high depression screening.  Patient consented to have a LCSW call her on 05/29/24 a call is scheduled.  Completed.  Reviewed and offered 30 day TOC program and patient has consented.   Screening for signs and symptoms of depression related to chronic disease state  RE-assessed social determinant of health barriers Patient has to cancel PCP appt due to not having any gas, rescheduled for 06/01/24.  Patient reported she has gas to go to appointment today.  Allowed time for patient to speak  about issues and troubles, had no family support, new to the area last year, low resources in the area, discussed [email or web address] as has internet access.  Discussed/educated on rationale for monitoring blood pressures, weights and blood sugar at least 3X/week.  Has blood pressure cuff Will  discuss with PCP: needs new CBG meter as unable to find supplies for current meter.  Has not been checking blood sugars at home: reviewed rationale for this along with blood pressures and weights.   Patient Self Care Activities:  Attend all scheduled provider appointments Call pharmacy for medication refills 3-7 days in advance of running out of medications Call provider office for new concerns or questions  Notify RN Care Manager of TOC call rescheduling needs Participate in Transition of Care Program/Attend TOC scheduled calls Perform all self care activities independently  Take medications as prescribed   Work with the social worker to address care coordination needs and will continue to work with the clinical team to address health care and disease management related needs call the Suicide and Crisis Lifeline: 988 call the [place]  National Suicide Prevention Lifeline: 937-138-5095 or TTY: 629-848-9254 TTY 231-280-2027) to talk to a trained counselor call 1-800-273-TALK (toll free, 24 hour hotline) call 911 if experiencing a Mental Health or Behavioral Health Crisis Explore [email or web address] for different zip codes to move to with more resources if has to move.  Start taking blood pressure, checking blood sugars when obtains equipment, and weights at least 2X/week MWF and log to take to providers Rationale/education provided.   Plan:  Telephone follow up appointment with care management team member scheduled for:  06/01/24 at 930 am with Bing Edison RN The patient has been provided with contact information for the care management team and has been advised to call with any health related questions or concerns.  Referral for ongoing complex care management with RN CCM team. Contact [facility] LCSW as previously working with as needed.        Patient verbalizes understanding of instructions and care plan provided today and agrees to view in MyChart. Active MyChart status and patient understanding of  how to access instructions and care plan via MyChart confirmed with patient.     The patient has been provided with contact information for the care management team and has been advised to call with any health related questions or concerns.  The care management team will reach out to the patient again over the next 28 - 30 business [anticipated] days.   Please call the care guide team at [phone] if you need to cancel or reschedule your appointment.   Please call the Suicide and Crisis Lifeline: 988 call the [place]  National Suicide Prevention Lifeline: 219-069-9945 or TTY: 8124761757 TTY [phone]) to talk to a trained counselor call 1-800-273-TALK (toll free, 24 hour hotline) call 911 if you are experiencing a Mental Health or Behavioral Health Crisis or need someone to talk to.  Richerd Fish, RN, BSN, CCM [facility], [facility] RN Care Manager Direct Dial: [phone]

## 2024-06-12 NOTE — Transitions of Care (Post Inpatient/ED Visit) (Deleted)
   06/12/2024  Name: JOBETH PANGILINAN MRN: [id number] DOB: 1961/05/29  {[id number]}

## 2024-06-12 NOTE — Transitions of Care (Post Inpatient/ED Visit) (Signed)
 Transition of Care Week 4/5 final call  Visit Note  06/12/2024  Name: Stacey Brooks MRN: [id number]          DOB: 12-Mar-1961  Situation: Patient enrolled in [facility] 30-day program. Visit completed with patient by telephone.   Background:   Initial Transition Care Management Follow-up Telephone Call    Past Medical History:  Diagnosis Date   ANA positive    Anxiety    Asthma    Intermittent; asthmatic bronchitis   Cancer (HCC)    skin cancer L arm   Carpal tunnel syndrome on right 10/11/2008   Cervical nerve root impingement 10/03/2012   Cervical spondylolysis 10/03/2012   Chronic back pain    Colon polyps 08/25/2012   Constipation    COPD (chronic obstructive pulmonary disease) (HCC) 06/25/2011   De Quervain's disease (tenosynovitis) 07/02/2013   Depression 06/25/2011   Diabetes mellitus without complication (HCC)    Diverticulosis 08/25/2012   Fatty liver 06/10/2015   Fibromyalgia 06/30/2009   GERD (gastroesophageal reflux disease) 07/03/2003   Heme + stool 04/11/2019   Hemorrhoids 07/01/2014   Hirsutism    Hypercholesterolemia 07/05/2003   Mild   Hyperlipidemia    Hypertension    IBS (irritable bowel syndrome) 2010   Insomnia 2010   Lumbar degenerative disc disease    Migraine 08/17/2019   Neural foraminal stenosis of cervical spine    Obesity 08/23/2013   Obstructive chronic bronchitis (HCC)    Osteoarthritis 06/25/2011   Pilar cyst 07/30/2011   Polymyalgia 03/31/2009   SS-A antibody positive 08/22/2012   Suicide attempt (HCC) 06/25/2011   Thyroid  disease    Hypothyroidism   Tobacco abuse 07/03/2003   Trigger middle finger 05/30/2008    Assessment: Patient Reported Symptoms: Cognitive Cognitive Status: Alert and oriented to person, place, and time, Normal speech and language skills      Neurological Neurological Review of Symptoms: Weakness Neurological Management Strategies: Activity, Adequate rest, Coping strategies, Medication therapy, Routine  screening Neurological Self-Management Outcome: 3 (uncertain)  HEENT HEENT Symptoms Reported: No symptoms reported      Cardiovascular Cardiovascular Symptoms Reported: No symptoms reported Cardiovascular Management Strategies: Activity, Adequate rest, Medical device, Medication therapy, Routine screening Cardiovascular Self-Management Outcome: 3 (uncertain)  Respiratory Respiratory Symptoms Reported: Productive cough Other Respiratory Symptoms: Chronic morning cough Respiratory Management Strategies: Activity, Adequate rest, Routine screening (Encouraged to stop smoking) Respiratory Self-Management Outcome: 3 (uncertain)  Endocrine Endocrine Symptoms Reported: No symptoms reported Is patient diabetic?: Yes Is patient checking blood sugars at home?: No Endocrine Self-Management Outcome: 3 (uncertain) Endocrine Comment: A1C was 5.0 started back on Qzempic but starting vomiting was taking higher dose noted  Gastrointestinal Gastrointestinal Symptoms Reported: Reflux/heartburn, Unintentional weight loss Additional Gastrointestinal Details: Mom's Meal Gastrointestinal Management Strategies: Coping strategies, Medication therapy Gastrointestinal Self-Management Outcome: 3 (uncertain)    Genitourinary Genitourinary Symptoms Reported: No symptoms reported, Pain/burning with urination (a little better)    Integumentary Integumentary Symptoms Reported: No symptoms reported Additional Integumentary Details: Has medication for itching not needed lately Skin Management Strategies: Medication therapy, Routine screening Skin Self-Management Outcome: 4 (good)  Musculoskeletal Musculoskelatal Symptoms Reviewed: No symptoms reported (Hx of Fibromyalgia, chronic issue) Musculoskeletal Management Strategies: Medication therapy, Activity, Adequate rest Musculoskeletal Self-Management Outcome: 3 (uncertain)      Psychosocial Psychosocial Symptoms Reported: No symptoms reported (HX of anxiety,feel  pretty good today)         Vitals:    Medications Reviewed Today     Reviewed by Eilleen Richerd GRADE, RN (Registered Nurse) on  06/12/24 at 1351  Med List Status: <None>   Medication Order Taking? Sig Documenting Provider Last Dose Status Informant  albuterol (PROVENTIL HFA;VENTOLIN HFA) 108 (90 Base) MCG/ACT inhaler [id number] Yes Inhale 2 puffs into the lungs every 6 (six) hours as needed for wheezing or shortness of breath. [provider]  Active Self  ALPRAZolam (XANAX) 0.25 MG tablet [id number] Yes Take 0.25 mg by mouth daily. [provider]  Active            Med Note LESLY, RICHERD GRADE   Tue Jun 12, 2024  1:48 PM) Mostly at night to help rest  cyanocobalamin  (VITAMIN B12) injection 1,000 mcg [id number]   Huenink, Laura, FNP  Active   cyclobenzaprine  (FLEXERIL ) 10 MG tablet [id number] Yes Take 1 tablet (10 mg total) by mouth 3 (three) times daily as needed for muscle spasms. Bevely Doffing, FNP  Active   Fluticasone-Umeclidin-Vilant [facility] ELLIPTA) 200-62.5-25 MCG/ACT AEPB [id number] Yes Inhale into the lungs. [provider]  Active   hydrochlorothiazide (HYDRODIURIL) 25 MG tablet [id number] Yes Take 25 mg by mouth daily. [provider]  Active Self           Med Note LESLY, RICHERD GRADE   Tue Jun 12, 2024  1:49 PM) Deliver medication  hydrOXYzine (ATARAX) 25 MG tablet [id number] Yes Take 25 mg by mouth 3 (three) times daily as needed for itching. [provider]  Active   lisinopril  (ZESTRIL ) 40 MG tablet [id number] Yes Take 1 tablet (40 mg total) by mouth daily. Bevely Doffing, FNP  Active   meloxicam  (MOBIC ) 7.5 MG tablet [id number] Yes Take 1 tablet (7.5 mg total) by mouth daily as needed for pain. Bevely Doffing, FNP  Active   omeprazole  (PRILOSEC) 40 MG capsule [id number] Yes Take 1 capsule (40 mg total) by mouth 2 (two) times daily before a meal. Bevely Doffing, FNP  Active   rosuvastatin  (CRESTOR ) 20 MG tablet [id number] Yes Take 1 tablet  (20 mg total) by mouth daily. Bevely Doffing, FNP  Active   Semaglutide ,0.25 or 0.5MG /DOS, (OZEMPIC , 0.25 OR 0.5 MG/DOSE,) 2 MG/1.5ML SOPN [id number] Yes Inject 0.25 mg into the skin every 7 (seven) days. Bevely Doffing, FNP  Active             Recommendation:   Referral to: [facility] RN CCM and ongoing follow up with [facility] LCSW as needed  Follow Up Plan:   Referral to RN Case Manager  RICHERD Fish, RN, BSN, CCM [place]  [facility], [facility] RN Care Manager Direct Dial: [phone]

## 2024-06-15 DIAGNOSIS — Z515 Encounter for palliative care: Secondary | ICD-10-CM | POA: Diagnosis not present

## 2024-06-15 DIAGNOSIS — J449 Chronic obstructive pulmonary disease, unspecified: Secondary | ICD-10-CM | POA: Diagnosis not present

## 2024-06-26 ENCOUNTER — Other Ambulatory Visit: Payer: Self-pay | Admitting: *Deleted

## 2024-06-26 NOTE — Patient Instructions (Signed)
 Visit Information  Thank you for taking time to visit with me today. Please don't hesitate to contact me if I can be of assistance to you before our next scheduled appointment.  Our next appointment is by telephone on 07-10-2024 at 1:00 pm Please call the care guide team at [phone] if you need to cancel or reschedule your appointment.   Following is a copy of your care plan:   Goals Addressed             This Visit's Progress    [facility]I RN Care Plan - COPD       Problems:  Chronic Disease Management support and education needs related to COPD  Goal: Over the next 90 days the Patient will attend all scheduled medical appointments: with primary care provider and specialist as evidenced by keeping all scheduled appointments        continue to work with RN Care Manager and/or Social Worker to address care management and care coordination needs related to COPD as evidenced by adherence to care management team scheduled appointments     take all medications exactly as prescribed and will call provider for medication related questions as evidenced by compliance with all medications    verbalize basic understanding of COPD disease process and self health management plan as evidenced by verbal explanation, recognizing/monitoring symptoms, lifestyle modifications  Interventions:   COPD Interventions: Advised patient to engage in light exercise as tolerated 3-5 days a week to aid in the the management of COPD Advised patient to track and manage COPD triggers Advised patient to self assesses COPD action plan zone and make appointment with provider if in the yellow zone for 48 hours without improvement Assessed social determinant of health barriers Discussed the importance of adequate rest and management of fatigue with COPD Provided education about and advised patient to utilize infection prevention strategies to reduce risk of respiratory infection Provided instruction about proper use of  medications used for management of COPD including inhalers Provided patient with basic written and verbal COPD education on self care/management/and exacerbation prevention Provided written and verbal instructions on pursed lip breathing and utilized returned demonstration as teach back Screening for signs and symptoms of depression related to chronic disease state   Patient Self-Care Activities:  Attend all scheduled provider appointments Call provider office for new concerns or questions  Take medications as prescribed   eliminate smoking in my home identify and remove indoor air pollutants develop a rescue plan follow rescue plan if symptoms flare-up practice relaxation or meditation daily do breathing exercises every day  Plan:  Telephone follow up appointment with care management team member scheduled for:  07-10-2024 at 1:00 pm          [facility]BCI RN Care Plan - HTN       Problems:  Chronic Disease Management support and education needs related to HTN  Goal: Over the next 90 days the Patient will attend all scheduled medical appointments: with primary care provider and specialist as evidenced by keeping all scheduled appointments        continue to work with RN Care Manager and/or Social Worker to address care management and care coordination needs related to HTN as evidenced by adherence to care management team scheduled appointments     take all medications exactly as prescribed and will call provider for medication related questions as evidenced by compliance with all medications    verbalize basic understanding of HTN disease process and self health management plan as evidenced  by verbal explanation, recognizing/monitoring symptoms, lifestyle modifications  Interventions:   Hypertension Interventions: Last practice recorded BP readings:  BP Readings from Last 3 Encounters:  02/14/24 (!) 145/85  12/15/23 130/75  01/06/23 (!) 117/90   Most recent eGFR/CrCl:  Lab Results   Component Value Date   EGFR 91 12/15/2023    No components found for: CRCL  Evaluation of current treatment plan related to hypertension self management and patient's adherence to plan as established by provider Provided education to patient re: stroke prevention, s/s of heart attack and stroke Reviewed medications with patient and discussed importance of compliance Counseled on adverse effects of illicit drug and excessive alcohol use in patients with high blood pressure  Counseled on the importance of exercise goals with target of 150 minutes per week Discussed plans with patient for ongoing care management follow up and provided patient with direct contact information for care management team Advised patient, providing education and rationale, to monitor blood pressure daily and record, calling PCP for findings outside established parameters Reviewed scheduled/upcoming provider appointments including:  Advised patient to discuss elevated home BP readings with provider Provided education on prescribed diet DASH Discussed complications of poorly controlled blood pressure such as heart disease, stroke, circulatory complications, vision complications, kidney impairment, sexual dysfunction Screening for signs and symptoms of depression related to chronic disease state  Assessed social determinant of health barriers  Patient Self-Care Activities:  Attend all scheduled provider appointments Call provider office for new concerns or questions  Take medications as prescribed   check blood pressure daily write blood pressure results in a log or diary call doctor for signs and symptoms of high blood pressure take medications for blood pressure exactly as prescribed report new symptoms to your doctor eat more whole grains, fruits and vegetables, lean meats and healthy fats  Plan:  Telephone follow up appointment with care management team member scheduled for:  07-10-2024 at 1:00 pm              Please call the Suicide and Crisis Lifeline: 988 call the [place]  National Suicide Prevention Lifeline: 534-135-3148 or TTY: (970) 791-8589 TTY (780)055-6255) to talk to a trained counselor call 1-800-273-TALK (toll free, 24 hour hotline) if you are experiencing a Mental Health or Behavioral Health Crisis or need someone to talk to.  Patient verbalizes understanding of instructions and care plan provided today and agrees to view in MyChart. Active MyChart status and patient understanding of how to access instructions and care plan via MyChart confirmed with patient.     Rosina Forte, BSN RN [facility], [facility] RN Care Manager Direct Dial: [phone]  Fax: [phone]

## 2024-06-26 NOTE — Patient Outreach (Signed)
 Complex Care Management   Visit Note  06/26/2024  Name:  Stacey Brooks MRN: [id number] DOB: August 25, 1961  Situation: Referral received for Complex Care Management related to COPD and HTN I obtained verbal consent from Patient.  Visit completed with Patient  on the phone  Background:   Past Medical History:  Diagnosis Date   ANA positive    Anxiety    Asthma    Intermittent; asthmatic bronchitis   Cancer (HCC)    skin cancer L arm   Carpal tunnel syndrome on right 10/11/2008   Cervical nerve root impingement 10/03/2012   Cervical spondylolysis 10/03/2012   Chronic back pain    Colon polyps 08/25/2012   Constipation    COPD (chronic obstructive pulmonary disease) (HCC) 06/25/2011   De Quervain's disease (tenosynovitis) 07/02/2013   Depression 06/25/2011   Diabetes mellitus without complication (HCC)    Diverticulosis 08/25/2012   Fatty liver 06/10/2015   Fibromyalgia 06/30/2009   GERD (gastroesophageal reflux disease) 07/03/2003   Heme + stool 04/11/2019   Hemorrhoids 07/01/2014   Hirsutism    Hypercholesterolemia 07/05/2003   Mild   Hyperlipidemia    Hypertension    IBS (irritable bowel syndrome) 2010   Insomnia 2010   Lumbar degenerative disc disease    Migraine 08/17/2019   Neural foraminal stenosis of cervical spine    Obesity 08/23/2013   Obstructive chronic bronchitis (HCC)    Osteoarthritis 06/25/2011   Pilar cyst 07/30/2011   Polymyalgia 03/31/2009   SS-A antibody positive 08/22/2012   Suicide attempt (HCC) 06/25/2011   Thyroid  disease    Hypothyroidism   Tobacco abuse 07/03/2003   Trigger middle finger 05/30/2008    Assessment: Patient Reported Symptoms:  Cognitive Cognitive Status: No symptoms reported Cognitive/Intellectual Conditions Management [RPT]: None reported or documented in medical history or problem list   Health Maintenance Behaviors: Annual physical exam Healing Pattern: Average Health Facilitated by: Rest  Neurological  Neurological Review of Symptoms: Vision changes Neurological Management Strategies: Routine screening Neurological Self-Management Outcome: 3 (uncertain) Neurological Comment: Needing to schedule an eye exam  HEENT HEENT Symptoms Reported: No symptoms reported HEENT Management Strategies: Routine screening HEENT Self-Management Outcome: 4 (good)    Cardiovascular Cardiovascular Symptoms Reported: No symptoms reported Does patient have uncontrolled Hypertension?: No Cardiovascular Management Strategies: Routine screening Cardiovascular Self-Management Outcome: 4 (good)  Respiratory Respiratory Symptoms Reported: No symptoms reported Respiratory Management Strategies: Routine screening Respiratory Self-Management Outcome: 4 (good)  Endocrine Endocrine Symptoms Reported: No symptoms reported Is patient diabetic?: No Is patient checking blood sugars at home?: No Endocrine Self-Management Outcome: 4 (good)  Gastrointestinal Gastrointestinal Symptoms Reported: Reflux/heartburn Gastrointestinal Management Strategies: Medication therapy Gastrointestinal Self-Management Outcome: 4 (good)    Genitourinary Genitourinary Symptoms Reported: No symptoms reported Genitourinary Self-Management Outcome: 4 (good)  Integumentary Integumentary Symptoms Reported: Other Other Integumentary Symptoms: 5 sores that she states that was previously diagnosed as skin cancer. Skin Management Strategies: Routine screening Skin Self-Management Outcome: 3 (uncertain) Skin Comment: Declined following up with dermatology and states she is using the salve that was provided to her by dermatology  Musculoskeletal Musculoskelatal Symptoms Reviewed: Joint pain Musculoskeletal Management Strategies: Routine screening, Medication therapy Musculoskeletal Self-Management Outcome: 3 (uncertain) Falls in the past year?: Yes Number of falls in past year: 1 or less Was there an injury with Fall?: Yes Fall Risk Category  Calculator: 2 Patient Fall Risk Level: Moderate Fall Risk Patient at Risk for Falls Due to: History of fall(s), Impaired balance/gait Fall risk Follow up: Falls evaluation completed, Education provided  Psychosocial Psychosocial Symptoms Reported: Anxiety - if selected complete GAD Behavioral Management Strategies: Coping strategies, Medication therapy Behavioral Health Self-Management Outcome: 4 (good) Major Change/Loss/Stressor/Fears (CP): Medical condition, self Techniques to Cope with Loss/Stress/Change: Medication Quality of Family Relationships: non-existent Do you feel physically threatened by others?: No    06/26/2024    PHQ2-9 Depression Screening   Little interest or pleasure in doing things Nearly every day  Feeling down, depressed, or hopeless Nearly every day  PHQ-2 - Total Score 6  Trouble falling or staying asleep, or sleeping too much Several days  Feeling tired or having little energy Nearly every day  Poor appetite or overeating  Nearly every day  Feeling bad about yourself - or that you are a failure or have let yourself or your family down Nearly every day  Trouble concentrating on things, such as reading the newspaper or watching television Not at all  Moving or speaking so slowly that other people could have noticed.  Or the opposite - being so fidgety or restless that you have been moving around a lot more than usual Not at all  Thoughts that you would be better off dead, or hurting yourself in some way Not at all  PHQ2-9 Total Score 16  If you checked off any problems, how difficult have these problems made it for you to do your work, take care of things at home, or get along with other people Not difficult at all  Depression Interventions/Treatment      There were no vitals filed for this visit.  Medications Reviewed Today     Reviewed by Bertrum Rosina HERO, RN (Registered Nurse) on 06/26/24 at 1512  Med List Status: <None>   Medication Order Taking? Sig  Documenting Provider Last Dose Status Informant  albuterol (PROVENTIL HFA;VENTOLIN HFA) 108 (90 Base) MCG/ACT inhaler [id number] Yes Inhale 2 puffs into the lungs every 6 (six) hours as needed for wheezing or shortness of breath. [provider]  Active Self  ALPRAZolam (XANAX) 0.25 MG tablet [id number] Yes Take 0.25 mg by mouth daily. [provider]  Active            Med Note LESLY, RICHERD GRADE   Tue Jun 12, 2024  1:48 PM) Mostly at night to help rest  cyanocobalamin  (VITAMIN B12) injection 1,000 mcg [id number]   Huenink, Laura, FNP  Active   cyclobenzaprine  (FLEXERIL ) 10 MG tablet [id number] Yes Take 1 tablet (10 mg total) by mouth 3 (three) times daily as needed for muscle spasms. Bevely Doffing, FNP  Active   Fluticasone-Umeclidin-Vilant [facility] ELLIPTA) 200-62.5-25 MCG/ACT AEPB [id number] Yes Inhale into the lungs. [provider]  Active   hydrochlorothiazide (HYDRODIURIL) 25 MG tablet [id number] Yes Take 25 mg by mouth daily. [provider]  Active Self           Med Note LESLY, RICHERD GRADE   Tue Jun 12, 2024  1:49 PM) Deliver medication  hydrOXYzine (ATARAX) 25 MG tablet [id number] Yes Take 25 mg by mouth 3 (three) times daily as needed for itching. [provider]  Active   lisinopril  (ZESTRIL ) 40 MG tablet [id number] Yes Take 1 tablet (40 mg total) by mouth daily. Bevely Doffing, FNP  Active   meloxicam  (MOBIC ) 7.5 MG tablet [id number] Yes Take 1 tablet (7.5 mg total) by mouth daily as needed for pain. Bevely Doffing, FNP  Active   omeprazole  (PRILOSEC) 40 MG capsule [id number] Yes Take 1 capsule (40 mg total) by mouth 2 (two) times daily before  a meal. Bevely Doffing, FNP  Active   rosuvastatin  (CRESTOR ) 20 MG tablet [id number] Yes Take 1 tablet (20 mg total) by mouth daily. Bevely Doffing, FNP  Active   Semaglutide ,0.25 or 0.5MG /DOS, (OZEMPIC , 0.25 OR 0.5 MG/DOSE,) 2 MG/1.5ML SOPN [id number] Yes Inject 0.25 mg into the skin every 7 (seven) days.  Bevely Doffing, FNP  Active            Med Note LESLY, VICTORIA Y   Tue Jun 12, 2024  1:58 PM) Onnie higher does reminded that it's every 7 days (once a week)            Recommendation:   Continue Current Plan of Care  Follow Up Plan:   Telephone follow-up in 2 weeks  Rosina Forte, BSN RN [facility], [facility] RN Care Manager Direct Dial: [phone]  Fax: [phone]

## 2024-06-27 ENCOUNTER — Other Ambulatory Visit: Payer: Self-pay

## 2024-06-27 DIAGNOSIS — F321 Major depressive disorder, single episode, moderate: Secondary | ICD-10-CM

## 2024-06-27 MED ORDER — SERTRALINE HCL 25 MG PO TABS: 25.0000 mg | ORAL_TABLET | Freq: Every day | ORAL | 3 refills | Status: DC

## 2024-07-10 ENCOUNTER — Ambulatory Visit

## 2024-07-10 ENCOUNTER — Telehealth: Payer: Self-pay

## 2024-07-10 ENCOUNTER — Telehealth: Payer: Self-pay | Admitting: *Deleted

## 2024-07-10 ENCOUNTER — Encounter: Payer: Self-pay | Admitting: *Deleted

## 2024-07-10 NOTE — Telephone Encounter (Signed)
 Copied from CRM [id number]. Topic: Clinical - Medication Question >> Jul 10, 2024  4:10 PM Winona R wrote: Pt wold like to know if she is due or over due for B12 shot

## 2024-07-10 NOTE — Telephone Encounter (Signed)
Called patient scheduled

## 2024-07-10 NOTE — Patient Instructions (Signed)
 Stacey Brooks - I am sorry I was unable to reach you today for our scheduled appointment. I work with Bevely Doffing, FNP and am calling to support your healthcare needs. Please contact me at [phone] at your earliest convenience. I look forward to speaking with you soon.   Thank you,  Rosina Forte, BSN RN [facility], [facility] RN Care Manager Direct Dial: [phone]  Fax: [phone]

## 2024-07-11 ENCOUNTER — Ambulatory Visit (INDEPENDENT_AMBULATORY_CARE_PROVIDER_SITE_OTHER)

## 2024-07-11 DIAGNOSIS — E538 Deficiency of other specified B group vitamins: Secondary | ICD-10-CM

## 2024-07-11 NOTE — Progress Notes (Signed)
 Patient is in office today for a nurse visit for B12 Injection. Patient Injection was given in the  Left deltoid. Patient tolerated injection well.

## 2024-07-16 ENCOUNTER — Ambulatory Visit: Admitting: Cardiology

## 2024-08-08 ENCOUNTER — Telehealth: Payer: Self-pay | Admitting: *Deleted

## 2024-08-08 NOTE — Patient Instructions (Signed)
 Boby GORMAN Dollar - I am sorry I was unable to reach you today for our scheduled appointment. I work with Bevely Doffing, FNP and am calling to support your healthcare needs. Please contact me at [phone] at your earliest convenience. I look forward to speaking with you soon.   Thank you,  Noami Bove  Administrator, [employer] [phone]

## 2024-08-17 ENCOUNTER — Ambulatory Visit

## 2024-08-23 ENCOUNTER — Encounter: Payer: Self-pay | Admitting: *Deleted

## 2024-08-23 ENCOUNTER — Telehealth: Payer: Self-pay | Admitting: *Deleted

## 2024-08-23 NOTE — Patient Instructions (Signed)
 Stacey Brooks - I have attempted to call you three times but have been unsuccessful in reaching you. I work with Bevely Doffing, FNP and am calling to support your healthcare needs. If I can be of assistance to you, please contact me at [phone].     Thank you,  Rosina Forte, BSN RN [facility], [facility] RN Care Manager Direct Dial: [phone]  Fax: [phone]

## 2024-08-27 ENCOUNTER — Ambulatory Visit: Payer: Self-pay

## 2024-08-27 NOTE — Telephone Encounter (Signed)
 FYI Only or Action Required?: Action required by provider: request for appointment. First available appt is with Dr. Bluford in . Pt states that this is arthritis.  My want a rheumatology referral. Has not slept in 4 nights.  Patient was last seen in primary care on 06/01/2024 by Bevely Doffing, FNP.  Called Nurse Triage reporting Leg Pain.shoulder pain  Symptoms began several months ago.  Interventions attempted: OTC medications:  [name]  Symptoms are: Gradually worsening.  Triage Disposition: See PCP When Office is Open (Within 3 Days)  Patient/caregiver understands and will follow disposition?: Yes                    Copied from CRM #[id number]. Topic: Clinical - Red Word Triage >> Aug 27, 2024  4:34 PM Drema MATSU wrote: Red Word that prompted transfer to Nurse Triage: Patient is itching all over, knots are coming back up from cone disease, haven't slept in 4 nights, extreme pain in left leg starting at knee cap and into back of thigh, and right hip. Reason for Disposition  [1] MODERATE pain (e.g., interferes with normal activities, limping) AND [2] present > 3 days  Answer Assessment - Initial Assessment Questions 1. ONSET: When did the pain start?      Months 2. LOCATION: Where is the pain located?      Left leg and right shoulder 3. PAIN: How bad is the pain?    (Scale 1-10; or mild, moderate, severe)     10/10 - unable to sleep 4. WORK OR EXERCISE: Has there been any recent work or exercise that involved this part of the body?      no 5. CAUSE: What do you think is causing the leg pain?     Bone disease 6. OTHER SYMPTOMS: Do you have any other symptoms? (e.g., chest pain, back pain, breathing difficulty, swelling, rash, fever, numbness, weakness)     Itchy all over.  Protocols used: Leg Pain-A-AH

## 2024-08-29 ENCOUNTER — Other Ambulatory Visit: Payer: Self-pay

## 2024-08-29 DIAGNOSIS — M797 Fibromyalgia: Secondary | ICD-10-CM

## 2024-08-29 NOTE — Telephone Encounter (Signed)
 Message sent to patient

## 2024-08-29 NOTE — Telephone Encounter (Signed)
 Rheumatology referral was placed

## 2024-09-21 ENCOUNTER — Ambulatory Visit: Admitting: Family Medicine

## 2024-09-24 ENCOUNTER — Ambulatory Visit: Payer: Self-pay

## 2024-09-24 NOTE — Telephone Encounter (Signed)
 FYI Only or Action Required?: FYI only for provider: appointment scheduled on THursday.  Patient was last seen in primary care on 06/01/2024 by Bevely Doffing, FNP.  Called Nurse Triage reporting Shoulder Pain, Hip Pain, and Advice Only.  Symptoms began over 1 months ago.  Interventions attempted: Other: see previous triage.  Symptoms are: unchanged.  Triage Disposition: Information or Advice Only Call  Patient/caregiver understands and will follow disposition?: Yes Please cancel Appt for tomorrow.            Reason for Disposition  [1] Follow-up call to recent contact AND [2] information only call, no triage required  Answer Assessment - Initial Assessment Questions 1. REASON FOR CALL: What is the main reason for your call? or How can I best help you?     Pt needs to reschedule her office visit. Lack of transportation 2. SYMPTOMS : Do you have any symptoms?      Yes - as before. Shoulder and hip pain  Protocols used: Information Only Call - No Triage-A-AH

## 2024-09-24 NOTE — Telephone Encounter (Signed)
Noted patient scheduled

## 2024-09-24 NOTE — Telephone Encounter (Signed)
 Patient reports has an appointment tomorrow but needs to reschedule  has no transportation ride cancelled . Pain is getting worse. Right shoulder and left hip. Both have been going years , but worse in the past few months. Shoulder is the worst pain concern. Right shoulder constant. Left hip only hurting lies down. Patient statescan I call you back my landlord just showed up patient confirms before hanging up no chest pain or shortness of breath or emergency symptoms. Patient then ended the line. Will place in call back folder to try and reach patient back if does not call back first.     Copied from CRM [id number]. Topic: Clinical - Red Word Triage >> Sep 24, 2024  1:48 PM Stacey Brooks wrote: Red Word that prompted transfer to Nurse Triage: Pt calling to reschedule an appt scheduled by nurse with NT triage for leg and shoulder pain.  States she is unable to make appt on 09/25/24, due to transportation, and would like to reschedule appt.

## 2024-09-25 ENCOUNTER — Ambulatory Visit: Admitting: Family Medicine

## 2024-09-27 ENCOUNTER — Ambulatory Visit: Admitting: Family Medicine

## 2024-09-28 ENCOUNTER — Other Ambulatory Visit: Payer: Self-pay

## 2024-10-03 ENCOUNTER — Ambulatory Visit: Admitting: Cardiology

## 2024-10-03 ENCOUNTER — Ambulatory Visit: Payer: Self-pay

## 2024-10-03 NOTE — Telephone Encounter (Signed)
Noted patient scheduled

## 2024-10-03 NOTE — Telephone Encounter (Signed)
 FYI Only or Action Required?: FYI only for provider: appointment scheduled on 10/04/2024.  Patient was last seen in primary care on 06/01/2024 by Bevely Doffing, FNP.  Called Nurse Triage reporting Pain.  Symptoms began several days ago.  Interventions attempted: Nothing.  Symptoms are: gradually worsening.  Triage Disposition: See Physician Within 24 Hours  Patient/caregiver understands and will follow disposition?: Yes  Copied from CRM #[id number]. Topic: Clinical - Red Word Triage >> Oct 03, 2024  1:24 PM Hadassah PARAS wrote: Red Word that prompted transfer to Nurse Triage: Burn when urinating, worsening chills and shaking, high fever, headache, has not eaten in 4 days. Reason for Disposition  Fever present > 3 days (72 hours)  Answer Assessment - Initial Assessment Questions Cough, lack of appetite, untreated fevers for 4 days. Says her right side burns when she touches it or puts lotion on it.  Does not see any rash or bumps. Is not hurting today.  Had some burning with urination on Sunday, has not happened since. Complains of bad odor and dark urine.  1. ONSET: When did the cough begin?      About 3 weeks ago 3. SPUTUM: Describe the color of your sputum (e.g., none, dry cough; clear, white, yellow, green)     green 4. HEMOPTYSIS: Are you coughing up any blood? If Yes, ask: How much? (e.g., flecks, streaks, tablespoons, etc.)     Denies 5. DIFFICULTY BREATHING: Are you having difficulty breathing? If Yes, ask: How bad is it? (e.g., mild, moderate, severe)      Denies 6. FEVER: Do you have a fever? If Yes, ask: What is your temperature, how was it measured, and when did it start?     Yes, says 100.4. Says she does not have anything to take for this. She does not have a car to go get more and no one to help her. Chills and shaking 10. OTHER SYMPTOMS: Do you have any other symptoms? (e.g., runny nose, wheezing, chest pain)       Had some chest tightness yesterday that is  gone today. Headache. Has not eaten in 4 days. States she is not hungry but has drank water  all day long.  Protocols used: Cough - Acute Productive-A-AH

## 2024-10-04 ENCOUNTER — Ambulatory Visit: Payer: Self-pay

## 2024-10-04 ENCOUNTER — Ambulatory Visit

## 2024-10-04 NOTE — Telephone Encounter (Signed)
 Pt has appt next week 1/22

## 2024-10-04 NOTE — Telephone Encounter (Signed)
 Patient has no new or worsening symptoms from yesterday. R side is hurting, dysuria, no appetite. Calling in because she has no transportation to her appointment this morning. She is going to call a transportation service to see when she can get a ride.    Copied from CRM [id number]. Topic: Clinical - Red Word Triage >> Oct 04, 2024  8:02 AM Donna BRAVO wrote: Red Word that prompted transfer to Nurse Triage:  Patient can not get a ride to the appt today 10/04/24 Feeling the same Chills Kidney?? pain

## 2024-10-08 ENCOUNTER — Ambulatory Visit: Payer: Self-pay

## 2024-10-08 NOTE — Telephone Encounter (Signed)
 Called [facility] and advised Britney of patient's symptoms.

## 2024-10-08 NOTE — Telephone Encounter (Signed)
 This RN called patient back and she states that her sister just got there with her.  Patient sounded better at this time and states that she felt better than earlier--she states that she and her sister are going to talk about her getting seen tonight.  She appreciated the call back and was advised that her PCP will have these notes of her concerns.

## 2024-10-08 NOTE — Telephone Encounter (Signed)
" °  FYI Only or Action Required?: FYI only for provider: ED advised.  Patient was last seen in primary care on 06/01/2024 by Bevely Doffing, FNP.  Called Nurse Triage reporting Depression.  Symptoms began today.  Interventions attempted: Nothing.  Symptoms are: rapidly worsening.  Triage Disposition: Go to ED Now (or PCP Triage)  Patient/caregiver understands and will follow disposition?: Unsure         Message from [place] B sent at 10/08/2024 12:38 PM EST  Reason for Triage: Patient feels like she is going crazy without her medication, she is very upset. Depressed. She has not had it in months. Zoloft    Reason for Disposition  Patient sounds very sick or weak to the triager  Answer Assessment - Initial Assessment Questions Patient called for a refill of her medication sertraline  (Zoloft ) 25mg  a  Patient has not had any sertraline  (Zoloft ) 25mg  tablet in months since the last written prescription  Patient has an appointment on Jan 22nd  Patient stressed---patient states her bills are a problem and her car was repossessed--patient states life stressors are worsening on her Arranged transportation through insurance for her upcoming appointment  Patient states that this pharmacy [facility] has not sent her her Albuterol , Trelegy, and Ropinirole  Patient states her Xanax prescription came in the mail yesterday---a 30 day supply but she has no more refills on that---she was going to talk to her PCP Doffing Bevely FNP about this medication as well  Patient upset and tearful during triage. She states her daughter took her out to eat yesterday and that was a good day but today she just started feeling very upset and states that the smallest things can make her upset  Patient denies any thoughts of harming herself or anyone else Patient states that she just feels like nobody cares about her. Patient states that she just had everything hit her this morning. She states she hasn't been  this bad in forever. She states she was sick recently and didn't eat much but she drink plenty of fluids Patient states she is struggling financially due to the government changing things with money for food and her being on a fixed income---she states that she went without food for a few days and her brother had to bring her some food. Patient states that it is getting so bad sometimes that you dont want to be here anymore. It's getting so hard that it feels like the more steps you take forward, the more you go backwards  Sister in [place]aughter is 15-20 minutes  Patient is advised that with her symptoms she is experiencing at this time it is recommended that she goes to the Emergency Room or to the [facility] (the phone number and address is provided to her for [facility]).  Patient states that she doesn't want to call 911 because then she wont have a ride back home.  She states she is going to call her sister and see if she can take her today.  This RN advised patient to call us  back with any other concerns or if she needs anything else. She is also advised that if anything worsens to call 911. Patient verbalized understanding.  Protocols used: Depression-A-AH  "

## 2024-10-08 NOTE — Telephone Encounter (Addendum)
 Called patient back to see if her sister was able to take her to the ER or [facility]---left a voicemail at this time

## 2024-10-08 NOTE — Telephone Encounter (Signed)
 Noted patient advised ED

## 2024-10-09 ENCOUNTER — Ambulatory Visit: Attending: Cardiology | Admitting: Cardiology

## 2024-10-09 VITALS — BP 130/78 | HR 83 | Ht 63.0 in | Wt 155.0 lb

## 2024-10-09 DIAGNOSIS — I2510 Atherosclerotic heart disease of native coronary artery without angina pectoris: Secondary | ICD-10-CM | POA: Diagnosis not present

## 2024-10-09 DIAGNOSIS — I10 Essential (primary) hypertension: Secondary | ICD-10-CM

## 2024-10-09 MED ORDER — METOPROLOL TARTRATE 50 MG PO TABS: 50.0000 mg | ORAL_TABLET | ORAL | 0 refills | Status: AC

## 2024-10-09 NOTE — Progress Notes (Signed)
 " Cardiology Office Note:  .   Date:  10/09/2024  ID:  Stacey Brooks, DOB December 11, 1960, MRN [id number] PCP: Bevely Doffing, FNP  [facility] Providers Cardiologist:  None     History of Present Illness: .   Stacey Brooks is a 64 y.o. female Discussed the use of AI scribe  History of Present Illness Stacey Brooks is a 64 year old female with coronary artery calcification who presents for evaluation of calcification noted on a CT scan. She was referred for evaluation of coronary calcification seen on a CT scan.  In May, she underwent a lung cancer screening test, which incidentally revealed coronary artery calcification and mild atherosclerotic calcification of the thoracic aorta.  She experiences lightheadedness and shortness of breath when walking. No significant chest tightness or heaviness, but she mentions having palpitations and skipped beats recently.  Her past medical history includes diabetes, which is currently well-controlled with an A1c of 5.4 as of March 2025. She was previously on Ozempic , which helped her lose 60 pounds.  Her current medications include lisinopril  40 mg and rosuvastatin  20 mg. She was previously on hydrochlorothiazide but has since discontinued it. She does not currently take low-dose aspirin but has used it in the past.  She has a history of smoking but has reduced her consumption over the years.      ROS: No syncope. Mild   Studies Reviewed: [name]   EKG Interpretation Date/Time:  Tuesday October 09 2024 11:00:48 EST Ventricular Rate:  83 PR Interval:  152 QRS Duration:  92 QT Interval:  388 QTC Calculation: 455 R Axis:   37  Text Interpretation: Normal sinus rhythm Normal ECG When compared with ECG of 04-Jan-2023 14:31, No significant change was found Confirmed by Jeffrie Anes ([id number]) on 10/09/2024 11:19:35 AM    Results Labs A1c (11/2023): 5.4 LDL cholesterol (11/2023): 74 Creatinine (11/2023): 0.7 Hemoglobin (11/2023): 14 Thyroid   function (11/2023): Within normal limits  Radiology Chest CT (01/2024): Coronary artery calcification; LAD proximal coronary calcification noted.  Atherosclerotic calcification of thoracic aortic wall (Independently interpreted) Risk Assessment/Calculations:            Physical Exam:   VS:  BP 130/78 (BP Location: Right Arm, Patient Position: Sitting, Cuff Size: Normal)   Pulse 83   Ht 5' 3 (1.6 m)   Wt 155 lb (70.3 kg)   SpO2 95%   BMI 27.46 kg/m    Wt Readings from Last 3 Encounters:  10/09/24 155 lb (70.3 kg)  06/01/24 146 lb (66.2 kg)  02/14/24 157 lb 9.6 oz (71.5 kg)    GEN: Well nourished, well developed in no acute distress NECK: No JVD; No carotid bruits CARDIAC: RRR, no murmurs, no rubs, no gallops RESPIRATORY:  Clear to auscultation without rales, wheezing or rhonchi  ABDOMEN: Soft, non-tender, non-distended EXTREMITIES:  No edema; No deformity   ASSESSMENT AND PLAN: .    Assessment and Plan Assessment & Plan Atherosclerotic heart disease of native coronary artery Mild atherosclerotic calcification in the coronary arteries and thoracic aorta on recent CT scan. Symptoms include shortness of breath and palpitations. No significant chest pain or heaviness. Smoking history may contribute to atherosclerosis. LDL cholesterol is 74 mg/dL, close to target of less than 70 mg/dL. Current medications include rosuvastatin  20 mg and lisinopril  40 mg. Not on low-dose aspirin. - Ordered coronary CT scan with contrast to assess for coronary artery stenosis. - Initiated low-dose aspirin 81 mg daily. - Continue rosuvastatin  20 mg daily. -  Continue lisinopril  40 mg daily.  Essential hypertension Blood pressure is well-controlled on lisinopril  40 mg daily. Previously on hydrochlorothiazide, which has been discontinued. - Continue lisinopril  40 mg daily.         Dispo: We will follow-up with results of testing  Signed, Oneil Parchment, MD   "

## 2024-10-09 NOTE — Patient Instructions (Signed)
 Medication Instructions:  The current medical regimen is effective;  continue present plan and medications.  *If you need a refill on your cardiac medications before your next appointment, please call your pharmacy*  Testing/Procedures:   Your cardiac CT will be scheduled at:  [facility]. [facility] [address]  [place], [place] [address]  Please enter the parking lot using the [address] entrance and use the FREE valet service at the patient drop-off area. Enter the building and check-in with registration on the main floor.  Please follow these instructions carefully (unless otherwise directed):  An IV will be required for this test and Nitroglycerin will be given.  Hold all erectile dysfunction medications at least 3 days (72 hrs) prior to test. (Ie viagra, cialis, sildenafil, tadalafil, etc)   On the Night Before the Test: Be sure to Drink plenty of water . Do not consume any caffeinated/decaffeinated beverages or chocolate 12 hours prior to your test. Do not take any antihistamines 12 hours prior to your test.  On the Day of the Test: Drink plenty of water  until 1 hour prior to the test. Do not eat any food 1 hour prior to test. You may take your regular medications prior to the test.  Take metoprolol  (Lopressor ) two hours prior to test. If you take Furosemide /Hydrochlorothiazide/Spironolactone/Chlorthalidone, please HOLD on the morning of the test. Patients who wear a continuous glucose monitor MUST remove the device prior to scanning. FEMALES- please wear underwire-free bra if available, avoid dresses & tight clothing      After the Test: Drink plenty of water . After receiving IV contrast, you may experience a mild flushed feeling. This is normal. On occasion, you may experience a mild rash up to 24 hours after the test. This is not dangerous. If this occurs, you can take Benadryl  25 mg, Zyrtec, Claritin, or Allegra and increase your fluid intake.  (Patients taking Tikosyn should avoid Benadryl , and may take Zyrtec, Claritin, or Allegra) If you experience trouble breathing, this can be serious. If it is severe call 911 IMMEDIATELY. If it is mild, please call our office.  We will call to schedule your test 2-4 weeks out understanding that some insurance companies will need an authorization prior to the service being performed.   For more information and frequently asked questions, please visit our website : [email or web address]  For non-scheduling related questions, please contact the cardiac imaging nurse navigator should you have any questions/concerns: Cardiac Imaging Nurse Navigators Direct Office Dial: [phone]   For scheduling needs, including cancellations and rescheduling, please call Brittany, [phone].   Follow-Up: At [facility], you and your health needs are our priority.  As part of our continuing mission to provide you with exceptional heart care, our providers are all part of one team.  This team includes your primary Cardiologist (physician) and Advanced Practice Providers or APPs (Physician Assistants and Nurse Practitioners) who all work together to provide you with the care you need, when you need it.  Your next appointment:   Follow up will be based on the results of the above testing.

## 2024-10-11 ENCOUNTER — Ambulatory Visit

## 2024-10-11 VITALS — BP 136/82 | HR 87 | Ht 63.0 in | Wt 155.0 lb

## 2024-10-11 DIAGNOSIS — E119 Type 2 diabetes mellitus without complications: Secondary | ICD-10-CM

## 2024-10-11 DIAGNOSIS — I10 Essential (primary) hypertension: Secondary | ICD-10-CM

## 2024-10-11 DIAGNOSIS — Z7985 Long-term (current) use of injectable non-insulin antidiabetic drugs: Secondary | ICD-10-CM

## 2024-10-11 DIAGNOSIS — M797 Fibromyalgia: Secondary | ICD-10-CM

## 2024-10-11 DIAGNOSIS — J449 Chronic obstructive pulmonary disease, unspecified: Secondary | ICD-10-CM | POA: Diagnosis not present

## 2024-10-11 DIAGNOSIS — J439 Emphysema, unspecified: Secondary | ICD-10-CM

## 2024-10-11 DIAGNOSIS — F321 Major depressive disorder, single episode, moderate: Secondary | ICD-10-CM

## 2024-10-11 DIAGNOSIS — E538 Deficiency of other specified B group vitamins: Secondary | ICD-10-CM | POA: Diagnosis not present

## 2024-10-11 MED ORDER — HYDROXYZINE HCL 25 MG PO TABS: 25.0000 mg | ORAL_TABLET | Freq: Three times a day (TID) | ORAL | 5 refills | Status: AC | PRN

## 2024-10-11 MED ORDER — ROPINIROLE HCL 0.25 MG PO TABS: 0.2500 mg | ORAL_TABLET | Freq: Every day | ORAL | 3 refills | Status: AC

## 2024-10-11 MED ORDER — ALBUTEROL SULFATE HFA 108 (90 BASE) MCG/ACT IN AERS: 1.0000 | INHALATION_SPRAY | RESPIRATORY_TRACT | 10 refills | Status: AC | PRN

## 2024-10-11 MED ORDER — SERTRALINE HCL 25 MG PO TABS: 25.0000 mg | ORAL_TABLET | Freq: Every day | ORAL | 3 refills | Status: AC

## 2024-10-11 MED ORDER — TRELEGY ELLIPTA 200-62.5-25 MCG/ACT IN AEPB: 1.0000 | INHALATION_SPRAY | Freq: Every day | RESPIRATORY_TRACT | 11 refills | Status: AC

## 2024-10-11 MED ORDER — OZEMPIC (0.25 OR 0.5 MG/DOSE) 2 MG/1.5ML ~~LOC~~ SOPN: 0.2500 mg | PEN_INJECTOR | SUBCUTANEOUS | 5 refills | Status: AC

## 2024-10-11 MED ORDER — LISINOPRIL 40 MG PO TABS: 40.0000 mg | ORAL_TABLET | Freq: Every day | ORAL | 3 refills | Status: AC

## 2024-10-11 MED ORDER — VITAMIN B-12 1000 MCG SL SUBL: 1.0000 | SUBLINGUAL_TABLET | Freq: Every day | SUBLINGUAL | 3 refills | Status: AC

## 2024-10-11 MED ORDER — MELOXICAM 7.5 MG PO TABS: 7.5000 mg | ORAL_TABLET | Freq: Every day | ORAL | 2 refills | Status: AC | PRN

## 2024-10-11 NOTE — Progress Notes (Signed)
 "  Established Patient Office Visit  Subjective   Patient ID: Stacey Brooks, female    DOB: [birth year]  Age: 64 y.o. MRN: [id number]  Chief Complaint  Patient presents with   Medical Management of Chronic Issues    6 month follow up, also lost  her transportation and can't make it to her monthly b-12 shots and she really needs them     HPI Discussed the use of AI scribe software for clinical note transcription with the patient, who gave verbal consent to proceed.  History of Present Illness    Stacey Brooks is a 64 year old female with diabetes who presents for medication management and refills.  Diabetes mellitus management - Has been without Ozempic  for approximately one month despite insurance coverage.  Depressive symptoms - Currently taking sertraline  (Zoloft ) 25 mg with beneficial effect. - Open to increasing the dose if necessary for improved symptom control.  Arthralgia and swelling - Taking meloxicam  with a 90-day supply to reduce pharmacy visits. - Experiences some swelling, though less severe than previously.     Patient Active Problem List   Diagnosis Date Noted   Fibromyalgia 06/05/2024   Coronary artery calcification seen on CT scan 06/05/2024   Vitamin B12 deficiency 02/14/2024   Rash of hand 02/14/2024   Essential hypertension 01/24/2024   COPD (chronic obstructive pulmonary disease) (HCC) 01/24/2024   Bilateral lower extremity edema 01/24/2024   Insomnia 01/24/2024   Type 2 diabetes mellitus without complications (HCC) 01/24/2024   Hyperlipidemia associated with type 2 diabetes mellitus (HCC) 01/24/2024   Unintentional weight loss 01/24/2024   Breast cancer screening by mammogram 01/24/2024   Tobacco use 01/24/2024   Hypertrichosis, idiopathic 06/18/2019   GERD (gastroesophageal reflux disease) 04/11/2019   IBS (irritable bowel syndrome) 2010   Hx of adenomatous colonic polyps 2010      ROS    Objective:     BP 136/82   Pulse 87   Ht 5'  3 (1.6 m)   Wt 155 lb (70.3 kg)   SpO2 (!) 84%   BMI 27.46 kg/m  BP Readings from Last 3 Encounters:  10/11/24 136/82  10/09/24 130/78  02/14/24 (!) 145/85   Wt Readings from Last 3 Encounters:  10/11/24 155 lb (70.3 kg)  10/09/24 155 lb (70.3 kg)  06/01/24 146 lb (66.2 kg)      Physical Exam Vitals and nursing note reviewed.  Constitutional:      Appearance: Normal appearance.  HENT:     Head: Normocephalic.  Eyes:     Extraocular Movements: Extraocular movements intact.     Pupils: Pupils are equal, round, and reactive to light.  Cardiovascular:     Rate and Rhythm: Normal rate and regular rhythm.  Pulmonary:     Effort: Pulmonary effort is normal.     Breath sounds: Normal breath sounds.  Musculoskeletal:     Cervical back: Normal range of motion and neck supple.  Neurological:     Mental Status: She is alert and oriented to person, place, and time.  Psychiatric:        Mood and Affect: Mood normal.        Thought Content: Thought content normal.      No results found for any visits on 10/11/24.    The 10-year ASCVD risk score (Arnett DK, et al., 2019) is: 21.9%    Assessment & Plan:   Problem List Items Addressed This Visit       Cardiovascular and Mediastinum  Essential hypertension   Relevant Medications   lisinopril  (ZESTRIL ) 40 MG tablet   meloxicam  (MOBIC ) 7.5 MG tablet     Respiratory   COPD (chronic obstructive pulmonary disease) (HCC) - Primary   Relevant Medications   albuterol  (VENTOLIN  HFA) 108 (90 Base) MCG/ACT inhaler   Fluticasone-Umeclidin-Vilant (TRELEGY ELLIPTA ) 200-62.5-25 MCG/ACT AEPB     Other   Vitamin B12 deficiency   Relevant Medications   Cyanocobalamin  (VITAMIN B-12) 1000 MCG SUBL   Fibromyalgia   Relevant Medications   meloxicam  (MOBIC ) 7.5 MG tablet   rOPINIRole  (REQUIP ) 0.25 MG tablet   sertraline  (ZOLOFT ) 25 MG tablet   Other Visit Diagnoses       Current moderate episode of major depressive disorder  without prior episode (HCC)       Relevant Medications   hydrOXYzine  (ATARAX ) 25 MG tablet   sertraline  (ZOLOFT ) 25 MG tablet       No follow-ups on file.    Leita Longs, FNP  "

## 2024-10-12 ENCOUNTER — Ambulatory Visit: Payer: Self-pay

## 2024-10-12 VITALS — Ht 63.0 in | Wt 155.0 lb

## 2024-10-12 DIAGNOSIS — Z0000 Encounter for general adult medical examination without abnormal findings: Secondary | ICD-10-CM | POA: Diagnosis not present

## 2024-10-12 DIAGNOSIS — Z1211 Encounter for screening for malignant neoplasm of colon: Secondary | ICD-10-CM

## 2024-10-12 DIAGNOSIS — E119 Type 2 diabetes mellitus without complications: Secondary | ICD-10-CM

## 2024-10-12 DIAGNOSIS — R928 Other abnormal and inconclusive findings on diagnostic imaging of breast: Secondary | ICD-10-CM

## 2024-10-12 MED ORDER — BLOOD GLUCOSE TEST VI STRP: 1.0000 | ORAL_STRIP | Freq: Three times a day (TID) | 0 refills | Status: AC

## 2024-10-12 MED ORDER — LANCET DEVICE MISC: 1.0000 | Freq: Three times a day (TID) | 0 refills | Status: AC

## 2024-10-12 MED ORDER — BLOOD GLUCOSE MONITORING SUPPL DEVI: 1.0000 | Freq: Three times a day (TID) | 0 refills | Status: AC

## 2024-10-12 MED ORDER — LANCETS MISC: 1.0000 | 0 refills | Status: AC

## 2024-10-12 NOTE — Progress Notes (Signed)
 "  Chief Complaint  Patient presents with   Medicare Wellness     Subjective:   Stacey Brooks is a 64 y.o. female who presents for a Medicare Annual Wellness Visit.  Visit info / Clinical Intake: Medicare Wellness Visit Type:: Initial Annual Wellness Visit Persons participating in visit and providing information:: patient Medicare Wellness Visit Mode:: Video Since this visit was completed virtually, some vitals may be partially provided or unavailable. Missing vitals are due to the limitations of the virtual format.: Documented vitals are patient reported If Telephone or Video please confirm:: I connected with patient using audio/video enable telemedicine. I verified patient identity with two identifiers, discussed telehealth limitations, and patient agreed to proceed. Patient Location:: home Provider Location:: office Interpreter Needed?: No Pre-visit prep was completed: yes AWV questionnaire completed by patient prior to visit?: no Living arrangements:: (!) lives alone Patient's Overall Health Status Rating: (!) fair Typical amount of pain: (!) a lot Does pain affect daily life?: (!) yes Are you currently prescribed opioids?: no  Dietary Habits and Nutritional Risks How many meals a day?: (!) 1 (patient states she currently has no food. resources provided) Eats fruit and vegetables daily?: (!) no Most meals are obtained by: preparing own meals In the last 2 weeks, have you had any of the following?: none Diabetic:: (!) yes Any non-healing wounds?: no How often do you check your BS?: 0 (pt states she has no home bg meter. prescription sent to preferred pharmacy) Would you like to be referred to a Nutritionist or for Diabetic Management? : no  Functional Status Activities of Daily Living (to include ambulation/medication): Independent Ambulation: Independent Medication Administration: Independent Home Management (perform basic housework or laundry): Independent Manage your  own finances?: yes Primary transportation is: family / friends (patient currently without a car) Concerns about vision?: (!) yes Concerns about hearing?: no  Fall Screening Falls in the past year?: 0 Number of falls in past year: 0 Was there an injury with Fall?: 0 Fall Risk Category Calculator: 0 Patient Fall Risk Level: Low Fall Risk  Fall Risk Patient at Risk for Falls Due to: No Fall Risks Fall risk Follow up: Falls evaluation completed; Education provided; Falls prevention discussed  Home and Transportation Safety: All rugs have non-skid backing?: yes All stairs or steps have railings?: N/A, no stairs Grab bars in the bathtub or shower?: (!) no Have non-skid surface in bathtub or shower?: yes Good home lighting?: yes Regular seat belt use?: yes Hospital stays in the last year:: (!) yes How many hospital stays:: 1 Reason: kidney infection  Cognitive Assessment Difficulty concentrating, remembering, or making decisions? : no Will 6CIT or Mini Cog be Completed: no 6CIT or Mini Cog Declined: patient declined  Advance Directives (For Healthcare) Does Patient Have a Medical Advance Directive?: No Would patient like information on creating a medical advance directive?: No - Patient declined  Reviewed/Updated  Reviewed/Updated: Reviewed All (Medical, Surgical, Family, Medications, Allergies, Care Teams, Patient Goals)    Allergies (verified) Clindamycin/lincomycin and Erythromycin   Current Medications (verified) Outpatient Encounter Medications as of 10/12/2024  Medication Sig   albuterol  (VENTOLIN  HFA) 108 (90 Base) MCG/ACT inhaler Inhale 1-2 puffs into the lungs every 4 (four) hours as needed for wheezing or shortness of breath.   ALPRAZolam (XANAX) 0.25 MG tablet Take 0.25 mg by mouth daily.   Blood Glucose Monitoring Suppl DEVI 1 each by Does not apply route in the morning, at noon, and at bedtime. May substitute to any manufacturer covered  by patient's insurance.    Cyanocobalamin  (VITAMIN B-12) 1000 MCG SUBL Place 1 tablet (1,000 mcg total) under the tongue daily.   cyclobenzaprine  (FLEXERIL ) 10 MG tablet Take 1 tablet (10 mg total) by mouth 3 (three) times daily as needed for muscle spasms.   Fluticasone-Umeclidin-Vilant (TRELEGY ELLIPTA ) 200-62.5-25 MCG/ACT AEPB Inhale 1 Inhalation into the lungs daily.   Glucose Blood (BLOOD GLUCOSE TEST STRIPS) STRP 1 each by In Vitro route in the morning, at noon, and at bedtime. May substitute to any manufacturer covered by patient's insurance.   hydrOXYzine  (ATARAX ) 25 MG tablet Take 1 tablet (25 mg total) by mouth 3 (three) times daily as needed for anxiety.   Lancet Device MISC 1 each by Does not apply route in the morning, at noon, and at bedtime. May substitute to any manufacturer covered by patient's insurance.   Lancets MISC 1 each by Does not apply route as directed. Dispense based on patient and insurance preference. Use up to four times daily as directed. (FOR ICD-10 E10.9, E11.9).   lisinopril  (ZESTRIL ) 40 MG tablet Take 1 tablet (40 mg total) by mouth daily.   meloxicam  (MOBIC ) 7.5 MG tablet Take 1 tablet (7.5 mg total) by mouth daily as needed for pain.   metoprolol  tartrate (LOPRESSOR ) 50 MG tablet Take 1 tablet (50 mg total) by mouth as directed. Take one tablet (2) hours before your CT scan   omeprazole  (PRILOSEC) 40 MG capsule Take 1 capsule (40 mg total) by mouth 2 (two) times daily before a meal.   rOPINIRole  (REQUIP ) 0.25 MG tablet Take 1 tablet (0.25 mg total) by mouth at bedtime.   rosuvastatin  (CRESTOR ) 20 MG tablet Take 1 tablet (20 mg total) by mouth daily.   Semaglutide ,0.25 or 0.5MG /DOS, (OZEMPIC , 0.25 OR 0.5 MG/DOSE,) 2 MG/1.5ML SOPN Inject 0.25 mg into the skin every 7 (seven) days.   sertraline  (ZOLOFT ) 25 MG tablet Take 1 tablet (25 mg total) by mouth daily.   No facility-administered encounter medications on file as of 10/12/2024.    History: Past Medical History:  Diagnosis Date   ANA  positive    Anxiety    Asthma    Intermittent; asthmatic bronchitis   Cancer (HCC)    skin cancer L arm   Carpal tunnel syndrome on right 10/11/2008   Cervical nerve root impingement 10/03/2012   Cervical spondylolysis 10/03/2012   Chronic back pain    Colon polyps 08/25/2012   Constipation    COPD (chronic obstructive pulmonary disease) (HCC) 06/25/2011   De Quervain's disease (tenosynovitis) 07/02/2013   Depression 06/25/2011   Diabetes mellitus without complication (HCC)    Diverticulosis 08/25/2012   Fatty liver 06/10/2015   Fibromyalgia 06/30/2009   GERD (gastroesophageal reflux disease) 07/03/2003   Heme + stool 04/11/2019   Hemorrhoids 07/01/2014   Hirsutism    Hypercholesterolemia 07/05/2003   Mild   Hyperlipidemia    Hypertension    IBS (irritable bowel syndrome) 2010   Insomnia 2010   Lumbar degenerative disc disease    Migraine 08/17/2019   Neural foraminal stenosis of cervical spine    Obesity 08/23/2013   Obstructive chronic bronchitis (HCC)    Osteoarthritis 06/25/2011   Pilar cyst 07/30/2011   Polymyalgia 03/31/2009   SS-A antibody positive 08/22/2012   Suicide attempt (HCC) 06/25/2011   Thyroid  disease    Hypothyroidism   Tobacco abuse 07/03/2003   Trigger middle finger 05/30/2008   Past Surgical History:  Procedure Laterality Date   ABDOMINAL HYSTERECTOMY  04/04/2013  ANTERIOR INTEROSSEOUS NERVE DECOMPRESSION Left 01/06/2023   Procedure: DECOMPRESSION ULNAR NERVE AT CUBITAL TUNNEL LEFT ELBOW;  Surgeon: Murrell Drivers, MD;  Location: [facility];  Service: Orthopedics;  Laterality: Left;   BACK SURGERY  2007   CARPAL TUNNEL RELEASE Left 01/06/2023   Procedure: LEFT CARPAL TUNNEL RELEASE;  Surgeon: Murrell Drivers, MD;  Location: [facility];  Service: Orthopedics;  Laterality: Left;   CARPECTOMY WITH RADIAL STYLOIDECTOMY  01/05/2012   DeQuervain's Release    CERVICAL FUSION  2007   COLONOSCOPY WITH PROPOFOL  N/A 09/12/2019    Procedure: COLONOSCOPY WITH PROPOFOL ;  Surgeon: Shaaron Lamar HERO, MD;  [facility];  Service: Endoscopy;  Laterality: N/A;  7:30am   COLONOSCOPY WITH PROPOFOL  N/A 03/04/2020   Dr. Shaaron: Diverticulosis.  Multiple polyps removed.  Several tubular adenomas.  Next colonoscopy in 3 years will require modified bowel prep due to history of inadequate bowel prep before.[name]   POLYPECTOMY  09/12/2019   Procedure: POLYPECTOMY;  Surgeon: Shaaron Lamar HERO, MD;  Location: [facility];  Service: Endoscopy;;  hepatic flexure x5; cecal; descending   POLYPECTOMY  03/04/2020   Procedure: POLYPECTOMY;  Surgeon: Shaaron Lamar HERO, MD;  Location: [facility];  Service: Endoscopy;;   WRIST ARTHROSCOPY  01/05/2012   release of carpal ligament   Family History  Problem Relation Age of Onset   Emphysema Mother    Hypertension Father    Stroke Father    Cancer Maternal Grandmother    Cancer Maternal Grandfather    Ovarian cancer Sister    Colon cancer Neg Hx    Social History   Occupational History   Not on file  Tobacco Use   Smoking status: Every Day    Current packs/day: 0.50    Average packs/day: 0.5 packs/day for 35.0 years (17.5 ttl pk-yrs)    Types: Cigarettes   Smokeless tobacco: Never   Tobacco comments:    has used cocaine in the past, last 1 yr ago  Vaping Use   Vaping status: Never Used  Substance and Sexual Activity   Alcohol use: No    Alcohol/week: 0.0 standard drinks of alcohol   Drug use: Not Currently    Comment: has used cocaine last used 2023   Sexual activity: Not Currently    Birth control/protection: Surgical    Comment: hyst   Tobacco Counseling Ready to quit: No Counseling given: Yes Tobacco comments: has used cocaine in the past, last 1 yr ago  SDOH Screenings   Food Insecurity: Food Insecurity Present (10/12/2024)  Housing: High Risk (10/12/2024)  Transportation Needs: Unmet Transportation Needs (10/12/2024)  Utilities: Not At Risk (10/12/2024)  Depression  (PHQ2-9): High Risk (10/12/2024)  Financial Resource Strain: High Risk (05/14/2024)  Physical Activity: Inactive (10/12/2024)  Social Connections: Socially Isolated (10/12/2024)  Stress: Stress Concern Present (10/12/2024)  Tobacco Use: High Risk (10/12/2024)  Health Literacy: Adequate Health Literacy (10/12/2024)   See flowsheets for full screening details  Depression Screen PHQ 2 & 9 Depression Scale- Over the past 2 weeks, how often have you been bothered by any of the following problems? Little interest or pleasure in doing things: 3 Feeling down, depressed, or hopeless (PHQ Adolescent also includes...irritable): 3 PHQ-2 Total Score: 6 Trouble falling or staying asleep, or sleeping too much: 3 Feeling tired or having little energy: 3 Poor appetite or overeating (PHQ Adolescent also includes...weight loss): 3 Feeling bad about yourself - or that you are a failure or have let yourself or  your family down: 3 Trouble concentrating on things, such as reading the newspaper or watching television [facility] Adolescent also includes...like school work): 3 Moving or speaking so slowly that other people could have noticed. Or the opposite - being so fidgety or restless that you have been moving around a lot more than usual: 3 Thoughts that you would be better off dead, or of hurting yourself in some way: 0 PHQ-9 Total Score: 24 If you checked off any problems, how difficult have these problems made it for you to do your work, take care of things at home, or get along with other people?: Very difficult  Depression Treatment Depression Interventions/Treatment : [facility] Provided (CCM referral placed)     Goals Addressed               This Visit's Progress     I want to get a car and get my life back together (pt-stated)               Objective:    Today's Vitals   10/12/24 0824  Weight: 155 lb (70.3 kg)  Height: 5' 3 (1.6 m)   Body mass index is 27.46  kg/m.  Hearing/Vision screen Hearing Screening - Comments:: Patient denies any hearing difficulties.   Vision Screening - Comments:: Patient c/o vision difficulties. She has went to [facility] in [place] several times, but unable to afford a certain test they want to do. Resources provided to patient Immunizations and Health Maintenance Health Maintenance  Topic Date Due   FOOT EXAM  Never done   OPHTHALMOLOGY EXAM  Never done   Pneumococcal Vaccine: 50+ Years (2 of 2 - PCV) 11/08/2016   COVID-19 Vaccine (3 - Pfizer risk series) 03/27/2020   Mammogram  09/02/2021   Colonoscopy  03/05/2023   Medicare Annual Wellness (AWV)  01/31/2024   HEMOGLOBIN A1C  11/29/2024   Diabetic kidney evaluation - eGFR measurement  12/14/2024   Diabetic kidney evaluation - Urine ACR  02/13/2025   DTaP/Tdap/Td (4 - Td or Tdap) 05/11/2026   Influenza Vaccine  Completed   HPV VACCINES (No Doses Required) Completed   Hepatitis C Screening  Completed   HIV Screening  Completed   Zoster Vaccines- Shingrix  Completed   Hepatitis B Vaccines 19-59 Average Risk  Aged Out   Meningococcal B Vaccine  Aged Out        Assessment/Plan:  This is a routine wellness examination for Stacey Brooks.  Patient Care Team: Bevely Doffing, FNP as PCP - General (Family Medicine) Shaaron, Lamar HERO, MD as Consulting Physician (Gastroenterology)  I have personally reviewed and noted the following in the patients chart:   Medical and social history Use of alcohol, tobacco or illicit drugs  Current medications and supplements including opioid prescriptions. Functional ability and status Nutritional status Physical activity Advanced directives List of other physicians Hospitalizations, surgeries, and ER visits in previous 12 months Vitals Screenings to include cognitive, depression, and falls Referrals and appointments  Orders Placed This Encounter  Procedures   MM 3D DIAGNOSTIC MAMMOGRAM BILATERAL BREAST    Standing  Status:   Future    Expiration Date:   12/10/2025    Reason for Exam (SYMPTOM  OR DIAGNOSIS REQUIRED):   abnormal mammogram RT breast    Preferred imaging location?:   [facility]   US  LIMITED ULTRASOUND INCLUDING AXILLA LEFT BREAST     Standing Status:   Future    Expiration Date:   10/12/2025  Reason for Exam (SYMPTOM  OR DIAGNOSIS REQUIRED):   abnormal mammogram    Preferred imaging location?:   [facility]   US  LIMITED ULTRASOUND INCLUDING AXILLA RIGHT BREAST    Standing Status:   Future    Expiration Date:   10/12/2025    Reason for Exam (SYMPTOM  OR DIAGNOSIS REQUIRED):   abnormal mammogram    Preferred imaging location?:   [facility]   Ambulatory referral to Gastroenterology    Referral Priority:   Routine    Referral Type:   Consultation    Referral Reason:   Specialty Services Required    Number of Visits Requested:   1   In addition, I have reviewed and discussed with patient certain preventive protocols, quality metrics, and best practice recommendations. A written personalized care plan for preventive services as well as general preventive health recommendations were provided to patient.   Acey Woodfield, CMA   10/12/2024   Return October 14, 2025 at 10:00 am, for In office Medicare Well Visit w  Wellness Nurse.  After Visit Summary: (MyChart) Due to this being a telephonic visit, the after visit summary with patients personalized plan was offered to patient via MyChart    "

## 2024-10-12 NOTE — Patient Instructions (Signed)
 Ms. Stacey Brooks,  Thank you for taking the time for your Medicare Wellness Visit. I appreciate your continued commitment to your health goals. Please review the care plan we discussed, and feel free to reach out if I can assist you further.  Please note that Annual Wellness Visits do not include a physical exam. Some assessments may be limited, especially if the visit was conducted virtually. If needed, we may recommend an in-person follow-up with your provider.  Ongoing Care Seeing your primary care provider every 3 to 6 months helps us  monitor your health and provide consistent, personalized care.   Referrals If a referral was made during today's visit and you haven't received any updates within two weeks, please contact the referred provider directly to check on the status.  Mammogram at [facility] Call [phone] to schedule your screening No perfumes, lotions, or deodorants the day of your screening. You can schedule your mammogram through mychart!   Recommended Screenings:  Health Maintenance  Topic Date Due   Complete foot exam   Never done   Eye exam for diabetics  Never done   Pneumococcal Vaccine for age over 53 (2 of 2 - PCV) 11/08/2016   COVID-19 Vaccine (3 - Pfizer risk series) 03/27/2020   Breast Cancer Screening  09/02/2021   Colon Cancer Screening  03/05/2023   Medicare Annual Wellness Visit  01/31/2024   Hemoglobin A1C  11/29/2024   Yearly kidney function blood test for diabetes  12/14/2024   Kidney health urinalysis for diabetes  02/13/2025   DTaP/Tdap/Td vaccine (4 - Td or Tdap) 05/11/2026   Flu Shot  Completed   HPV Vaccine (No Doses Required) Completed   Hepatitis C Screening  Completed   HIV Screening  Completed   Zoster (Shingles) Vaccine  Completed   Hepatitis B Vaccine  Aged Out   Meningitis B Vaccine  Aged Out       10/12/2024    8:28 AM  Advanced Directives  Does Patient Have a Medical Advance Directive? No  Would patient like information on  creating a medical advance directive? No - Patient declined    Vision: Annual vision screenings are recommended for early detection of glaucoma, cataracts, and diabetic retinopathy. These exams can also reveal signs of chronic conditions such as diabetes and high blood pressure.  Dental: Annual dental screenings help detect early signs of oral cancer, gum disease, and other conditions linked to overall health, including heart disease and diabetes.  Please see the attached documents for additional preventive care recommendations.

## 2024-10-18 DIAGNOSIS — F321 Major depressive disorder, single episode, moderate: Secondary | ICD-10-CM | POA: Insufficient documentation

## 2024-10-18 NOTE — Assessment & Plan Note (Signed)
 Sublingual B12 prescribed.

## 2024-10-18 NOTE — Assessment & Plan Note (Signed)
 Currently on sertraline  25 mg, which she reports is helping. - Continue sertraline  25 mg daily.

## 2024-10-18 NOTE — Assessment & Plan Note (Signed)
 Essential hypertension managed with lisinopril . - Refill lisinopril  prescription.

## 2024-10-18 NOTE — Assessment & Plan Note (Signed)
 No specific discussion of symptoms or changes in condition during this visit. - Checked availability of Trelegy samples.

## 2024-10-18 NOTE — Assessment & Plan Note (Signed)
 Previously on Ozempic , currently out of medication for about a month. - Refilled Ozempic  prescription.

## 2024-10-18 NOTE — Assessment & Plan Note (Signed)
 Managed with mobic  and Requip .  No medication changes made today.  Refills provided.

## 2024-11-13 ENCOUNTER — Encounter (HOSPITAL_COMMUNITY)

## 2024-11-13 ENCOUNTER — Other Ambulatory Visit (HOSPITAL_COMMUNITY)

## 2024-12-13 ENCOUNTER — Ambulatory Visit

## 2025-04-10 ENCOUNTER — Ambulatory Visit: Payer: Self-pay

## 2025-10-14 ENCOUNTER — Ambulatory Visit: Payer: Self-pay
